# Patient Record
Sex: Female | Born: 1975
Health system: Southern US, Community
[De-identification: ages and names within clinical notes are randomized; demographics above are authoritative.]

## PROBLEM LIST (undated history)

## (undated) DIAGNOSIS — T8859XA Other complications of anesthesia, initial encounter: Secondary | ICD-10-CM

## (undated) DIAGNOSIS — Z9889 Other specified postprocedural states: Secondary | ICD-10-CM

## (undated) DIAGNOSIS — M2540 Effusion, unspecified joint: Secondary | ICD-10-CM

## (undated) DIAGNOSIS — E785 Hyperlipidemia, unspecified: Secondary | ICD-10-CM

## (undated) DIAGNOSIS — R112 Nausea with vomiting, unspecified: Secondary | ICD-10-CM

## (undated) HISTORY — DX: Effusion, unspecified joint: M25.40

## (undated) HISTORY — DX: Hyperlipidemia, unspecified: E78.5

---

## 2000-04-17 ENCOUNTER — Encounter: Admission: RE | Admit: 2000-04-17 | Discharge: 2000-04-17 | Payer: Self-pay | Admitting: Gastroenterology

## 2000-04-17 ENCOUNTER — Encounter: Payer: Self-pay | Admitting: Gastroenterology

## 2001-02-10 ENCOUNTER — Emergency Department (HOSPITAL_COMMUNITY): Admission: EM | Admit: 2001-02-10 | Discharge: 2001-02-10 | Payer: Self-pay | Admitting: Emergency Medicine

## 2001-02-10 ENCOUNTER — Encounter: Payer: Self-pay | Admitting: Emergency Medicine

## 2001-03-22 ENCOUNTER — Ambulatory Visit (HOSPITAL_COMMUNITY): Admission: RE | Admit: 2001-03-22 | Discharge: 2001-03-22 | Payer: Self-pay | Admitting: Gastroenterology

## 2001-03-26 ENCOUNTER — Ambulatory Visit (HOSPITAL_COMMUNITY): Admission: RE | Admit: 2001-03-26 | Discharge: 2001-03-26 | Payer: Self-pay | Admitting: Gastroenterology

## 2001-08-15 ENCOUNTER — Other Ambulatory Visit: Admission: RE | Admit: 2001-08-15 | Discharge: 2001-08-15 | Payer: Self-pay | Admitting: Obstetrics and Gynecology

## 2002-02-18 ENCOUNTER — Inpatient Hospital Stay (HOSPITAL_COMMUNITY): Admission: AD | Admit: 2002-02-18 | Discharge: 2002-02-18 | Payer: Self-pay | Admitting: Obstetrics and Gynecology

## 2002-03-04 ENCOUNTER — Inpatient Hospital Stay (HOSPITAL_COMMUNITY): Admission: AD | Admit: 2002-03-04 | Discharge: 2002-03-08 | Payer: Self-pay | Admitting: *Deleted

## 2002-03-09 ENCOUNTER — Encounter: Admission: RE | Admit: 2002-03-09 | Discharge: 2002-04-08 | Payer: Self-pay | Admitting: Obstetrics and Gynecology

## 2002-04-19 ENCOUNTER — Other Ambulatory Visit: Admission: RE | Admit: 2002-04-19 | Discharge: 2002-04-19 | Payer: Self-pay | Admitting: Obstetrics and Gynecology

## 2003-04-23 ENCOUNTER — Other Ambulatory Visit: Admission: RE | Admit: 2003-04-23 | Discharge: 2003-04-23 | Payer: Self-pay | Admitting: Obstetrics and Gynecology

## 2004-05-19 ENCOUNTER — Other Ambulatory Visit: Admission: RE | Admit: 2004-05-19 | Discharge: 2004-05-19 | Payer: Self-pay | Admitting: Obstetrics and Gynecology

## 2005-06-01 ENCOUNTER — Other Ambulatory Visit: Admission: RE | Admit: 2005-06-01 | Discharge: 2005-06-01 | Payer: Self-pay | Admitting: Obstetrics and Gynecology

## 2006-03-22 ENCOUNTER — Inpatient Hospital Stay (HOSPITAL_COMMUNITY): Admission: AD | Admit: 2006-03-22 | Discharge: 2006-03-22 | Payer: Self-pay | Admitting: Obstetrics and Gynecology

## 2006-08-29 HISTORY — PX: DILITATION & CURRETTAGE/HYSTROSCOPY WITH ESSURE: SHX5573

## 2006-09-12 ENCOUNTER — Inpatient Hospital Stay (HOSPITAL_COMMUNITY): Admission: AD | Admit: 2006-09-12 | Discharge: 2006-09-14 | Payer: Self-pay | Admitting: Obstetrics and Gynecology

## 2006-09-22 ENCOUNTER — Observation Stay (HOSPITAL_COMMUNITY): Admission: AD | Admit: 2006-09-22 | Discharge: 2006-09-22 | Payer: Self-pay | Admitting: Obstetrics & Gynecology

## 2011-08-26 ENCOUNTER — Ambulatory Visit (INDEPENDENT_AMBULATORY_CARE_PROVIDER_SITE_OTHER): Payer: BC Managed Care – PPO

## 2011-08-26 DIAGNOSIS — J111 Influenza due to unidentified influenza virus with other respiratory manifestations: Secondary | ICD-10-CM

## 2012-07-05 ENCOUNTER — Ambulatory Visit (INDEPENDENT_AMBULATORY_CARE_PROVIDER_SITE_OTHER): Payer: 59 | Admitting: Physician Assistant

## 2012-07-05 VITALS — BP 124/82 | HR 60 | Temp 98.4°F | Resp 16 | Ht 64.5 in | Wt 149.0 lb

## 2012-07-05 DIAGNOSIS — R197 Diarrhea, unspecified: Secondary | ICD-10-CM

## 2012-07-05 DIAGNOSIS — R110 Nausea: Secondary | ICD-10-CM

## 2012-07-05 DIAGNOSIS — A084 Viral intestinal infection, unspecified: Secondary | ICD-10-CM

## 2012-07-05 DIAGNOSIS — A088 Other specified intestinal infections: Secondary | ICD-10-CM

## 2012-07-05 MED ORDER — ONDANSETRON 4 MG PO TBDP
8.0000 mg | ORAL_TABLET | Freq: Once | ORAL | Status: AC
  Administered 2012-07-05: 8 mg via ORAL

## 2012-07-05 MED ORDER — PROMETHAZINE HCL 25 MG PO TABS: 25.0000 mg | ORAL_TABLET | Freq: Three times a day (TID) | ORAL | Status: DC | PRN

## 2012-07-05 NOTE — Progress Notes (Signed)
  Subjective:    Patient ID: Kayla Chaney, female    DOB: 1976-01-20, 37 y.o.   MRN: [id number]  HPI 36 year old female presents with acute onset of nausea, loose stools, and abdominal discomfort but states it is not pain. No fevers, chills, headache, or emesis.  Husband woke up with similar symptoms this morning. Admits that her daughter had viral gastroenteritis last week.  Has felt ok since then but today symptoms started. No emesis yet, but feels severely nauseous.     Review of Systems  Constitutional: Negative for fever and chills.  Gastrointestinal: Positive for nausea and diarrhea. Negative for vomiting and abdominal pain.  Neurological: Negative for headaches.  All other systems reviewed and are negative.       Objective:   Physical Exam  Constitutional: She is oriented to person, place, and time. She appears well-developed and well-nourished.  HENT:  Head: Normocephalic and atraumatic.  Right Ear: External ear normal.  Left Ear: External ear normal.  Eyes: Conjunctivae normal are normal.  Neck: Normal range of motion.  Cardiovascular: Normal rate, regular rhythm and normal heart sounds.   Pulmonary/Chest: Effort normal and breath sounds normal.  Abdominal: Soft. Bowel sounds are normal. There is tenderness (slight epigastric tenderness). There is no rebound and no guarding.  Neurological: She is alert and oriented to person, place, and time.  Psychiatric: She has a normal mood and affect. Her behavior is normal. Judgment and thought content normal.          Assessment & Plan:   1. Viral gastroenteritis    2. Nausea alone  ondansetron (ZOFRAN-ODT) disintegrating tablet 8 mg  3. Diarrhea    Zofran 8 mg given today Phenergan prn Push fluids OOW tomorrow if still symptomatic.

## 2012-07-06 ENCOUNTER — Ambulatory Visit (INDEPENDENT_AMBULATORY_CARE_PROVIDER_SITE_OTHER): Payer: Commercial Managed Care - PPO | Admitting: Physician Assistant

## 2012-07-06 VITALS — BP 89/57 | HR 84 | Temp 98.4°F | Resp 16 | Ht 64.5 in | Wt 148.0 lb

## 2012-07-06 DIAGNOSIS — R1110 Vomiting, unspecified: Secondary | ICD-10-CM

## 2012-07-06 DIAGNOSIS — R110 Nausea: Secondary | ICD-10-CM

## 2012-07-06 DIAGNOSIS — A084 Viral intestinal infection, unspecified: Secondary | ICD-10-CM

## 2012-07-06 DIAGNOSIS — A088 Other specified intestinal infections: Secondary | ICD-10-CM

## 2012-07-06 MED ORDER — ONDANSETRON 4 MG PO TBDP
8.0000 mg | ORAL_TABLET | Freq: Once | ORAL | Status: AC
  Administered 2012-07-06: 8 mg via ORAL

## 2012-07-06 NOTE — Progress Notes (Signed)
  Subjective:    Patient ID: Kayla Chaney, female    DOB: 09-01-1975, 36 y.o.   MRN: [id number]  HPI 36 year old female presents for follow up of nausea. Seen yesterday and had not started vomiting yet.  States that she did start having emesis when she left work which persisted through the night. Says the emesis stopped at approximately 5:00 a.m this morning, but she still feels nauseous and tired.  Has not been able to push fluids. Denies diarrhea, abdominal pain, or urinary symptoms.      Review of Systems  Gastrointestinal: Positive for nausea and vomiting. Negative for abdominal pain, diarrhea and constipation.  Neurological: Positive for dizziness and light-headedness.  All other systems reviewed and are negative.       Objective:   Physical Exam  Constitutional: She is oriented to person, place, and time. She appears well-developed and well-nourished.  HENT:  Head: Normocephalic and atraumatic.  Right Ear: External ear normal.  Left Ear: External ear normal.  Eyes: Conjunctivae normal are normal.  Cardiovascular: Normal rate.   Pulmonary/Chest: Effort normal.  Neurological: She is alert and oriented to person, place, and time.  Psychiatric: She has a normal mood and affect. Her behavior is normal. Judgment and thought content normal.          Assessment & Plan:   1. Viral gastroenteritis    2. Nausea  ondansetron (ZOFRAN-ODT) disintegrating tablet 8 mg  3. Emesis     Nausea increased while here receiving IV fluids.  Does admit that dizziness has improved with fluids.  No emesis yet today but due to increase in nausea will send home to rest.  Phenergan 25 mg tid prn Push fluids Follow up if symptoms worsen or fail to improve.

## 2013-09-10 ENCOUNTER — Ambulatory Visit (INDEPENDENT_AMBULATORY_CARE_PROVIDER_SITE_OTHER): Payer: 59 | Admitting: Physician Assistant

## 2013-09-10 VITALS — BP 126/78 | HR 83 | Temp 99.8°F | Resp 16 | Ht 64.5 in | Wt 154.6 lb

## 2013-09-10 DIAGNOSIS — R6889 Other general symptoms and signs: Secondary | ICD-10-CM

## 2013-09-10 LAB — POCT INFLUENZA A/B
Influenza A, POC: NEGATIVE
Influenza B, POC: NEGATIVE

## 2013-09-10 MED ORDER — HYDROCODONE-HOMATROPINE 5-1.5 MG/5ML PO SYRP: 5.0000 mL | ORAL_SOLUTION | Freq: Three times a day (TID) | ORAL | Status: DC | PRN

## 2013-09-10 NOTE — Progress Notes (Signed)
   Subjective:    Patient ID: Kayla Chaney, female    DOB: [birth year], 38 y.o.   MRN: [id number]  HPI Pt presents to clinic with 16hr h/o feeling poorly she woke up in the middle of the night feeling bad - throughout the day she has had increased myalgias and fever.  She has a dry cough which the mucinex seems to help.  She has some nasal congestion but not much.  OTC meds - mucinex DM and tylenol Sick contacts Flu vaccine this fall Review of Systems  Constitutional: Positive for fever (101 this afternoon) and chills.  HENT: Positive for congestion, postnasal drip, rhinorrhea (clear) and sore throat (irritated from the cough).   Respiratory: Positive for cough (dry).   Musculoskeletal: Positive for myalgias.  Psychiatric/Behavioral: Positive for sleep disturbance (2nd to cough).       Objective:   Physical Exam  Vitals reviewed. Constitutional: She is oriented to person, place, and time. She appears well-developed and well-nourished.  HENT:  Head: Normocephalic and atraumatic.  Right Ear: External ear normal.  Left Ear: External ear normal.  Eyes: Conjunctivae are normal.  Neck: Normal range of motion.  Cardiovascular: Normal rate, regular rhythm and normal heart sounds.   No murmur heard. Pulmonary/Chest: Effort normal and breath sounds normal.  Lymphadenopathy:       Head (right side): Tonsillar adenopathy present.       Head (left side): Tonsillar adenopathy present.    She has cervical adenopathy (Ac enlarged).       Right cervical: Superficial cervical adenopathy present.       Left cervical: Superficial cervical adenopathy present.       Right: No supraclavicular adenopathy present.       Left: No supraclavicular adenopathy present.  Neurological: She is alert and oriented to person, place, and time.  Skin: Skin is warm and dry.  Psychiatric: She has a normal mood and affect. Her behavior is normal. Judgment and thought content normal.   Results for orders placed in  visit on 09/10/13  POCT INFLUENZA A/B      Result Value Range   Influenza A, POC Negative     Influenza B, POC Negative         Assessment & Plan:  Flu-like symptoms - Plan: POCT Influenza A/B, HYDROcodone-homatropine (HYCODAN) 5-1.5 MG/5ML syrup  Pt had neg Flu testing - she declined the Tamiflu and she will continue her suportive care and we will add cough medication for nighttime.  Windell Hummingbird PA-C 09/10/2013 7:01 PM

## 2013-11-13 ENCOUNTER — Ambulatory Visit (INDEPENDENT_AMBULATORY_CARE_PROVIDER_SITE_OTHER): Payer: 59 | Admitting: Physician Assistant

## 2013-11-13 ENCOUNTER — Encounter: Payer: Self-pay | Admitting: Physician Assistant

## 2013-11-13 ENCOUNTER — Other Ambulatory Visit: Payer: Self-pay | Admitting: Physician Assistant

## 2013-11-13 VITALS — BP 110/61 | HR 59 | Temp 99.1°F | Resp 16 | Ht 64.5 in | Wt 153.9 lb

## 2013-11-13 DIAGNOSIS — M47812 Spondylosis without myelopathy or radiculopathy, cervical region: Secondary | ICD-10-CM | POA: Insufficient documentation

## 2013-11-13 DIAGNOSIS — G4700 Insomnia, unspecified: Secondary | ICD-10-CM

## 2013-11-13 MED ORDER — TRAZODONE HCL 50 MG PO TABS: 50.0000 mg | ORAL_TABLET | ORAL | Status: DC | PRN

## 2013-11-13 NOTE — Progress Notes (Signed)
   Subjective:    Patient ID: Kayla Chaney, female    DOB: 1975-11-28, 38 y.o.   MRN: [id number]  HPI Pt presents to clinic for an evaluated of continued insomnia.  She has a h/o of Ambien use but started to like her reaction to the medication.  She started on the trazodone and it really helps her sleep.  2-3 nights out of a month she finds that Trazodone does not allow her sleep restfully and she has tried 75mg  but that makes her groggy the next day.  She finds the nights she has trouble sleeping she is really stressed out.  She is otherwise doing well.  She likes her new position at work.  Review of Systems     Objective:   Physical Exam  Vitals reviewed. Constitutional: She is oriented to person, place, and time. She appears well-developed and well-nourished.  HENT:  Head: Normocephalic and atraumatic.  Right Ear: External ear normal.  Left Ear: External ear normal.  Eyes: Conjunctivae are normal.  Neck: Normal range of motion.  Pulmonary/Chest: Effort normal.  Neurological: She is alert and oriented to person, place, and time.  Skin: Skin is warm and dry.  Psychiatric: She has a normal mood and affect. Her behavior is normal. Judgment and thought content normal.       Assessment & Plan:  Insomnia - Plan: traZODone (DESYREL) 50 MG tablet - continue current regimen and let me know if she has worsening problems in the future.  Windell Hummingbird PA-C  [facility] 11/13/2013 6:22 PM

## 2013-12-24 ENCOUNTER — Ambulatory Visit: Payer: 59 | Admitting: Emergency Medicine

## 2013-12-25 ENCOUNTER — Ambulatory Visit: Payer: 59 | Admitting: Physician Assistant

## 2014-02-19 ENCOUNTER — Other Ambulatory Visit: Payer: Self-pay | Admitting: Physician Assistant

## 2014-02-19 DIAGNOSIS — G4700 Insomnia, unspecified: Secondary | ICD-10-CM

## 2014-02-19 MED ORDER — TRAZODONE HCL 50 MG PO TABS
50.0000 mg | ORAL_TABLET | Freq: Every evening | ORAL | Status: DC | PRN
Start: 2014-02-19 — End: 2014-02-19

## 2014-02-19 MED ORDER — TRAZODONE HCL 50 MG PO TABS: 50.0000 mg | ORAL_TABLET | Freq: Every evening | ORAL | Status: DC | PRN

## 2014-03-31 ENCOUNTER — Ambulatory Visit (INDEPENDENT_AMBULATORY_CARE_PROVIDER_SITE_OTHER): Payer: 59 | Admitting: Physician Assistant

## 2014-03-31 VITALS — BP 134/80 | HR 76 | Temp 99.6°F | Resp 16 | Ht 64.75 in | Wt 154.2 lb

## 2014-03-31 DIAGNOSIS — Z733 Stress, not elsewhere classified: Secondary | ICD-10-CM

## 2014-03-31 DIAGNOSIS — F439 Reaction to severe stress, unspecified: Secondary | ICD-10-CM

## 2014-03-31 DIAGNOSIS — G4700 Insomnia, unspecified: Secondary | ICD-10-CM

## 2014-03-31 MED ORDER — CLONAZEPAM 0.5 MG PO TABS: 0.2500 mg | ORAL_TABLET | Freq: Three times a day (TID) | ORAL | Status: DC | PRN
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
## 2014-04-14 ENCOUNTER — Other Ambulatory Visit: Payer: Self-pay | Admitting: Physician Assistant

## 2014-04-14 DIAGNOSIS — F439 Reaction to severe stress, unspecified: Secondary | ICD-10-CM

## 2014-04-14 MED ORDER — CLONAZEPAM 0.5 MG PO TABS
0.2500 mg | ORAL_TABLET | Freq: Three times a day (TID) | ORAL | Status: DC | PRN
Start: 2014-04-14 — End: 2014-06-01

## 2014-04-29 ENCOUNTER — Other Ambulatory Visit: Payer: Self-pay | Admitting: Physician Assistant

## 2014-05-06 ENCOUNTER — Other Ambulatory Visit: Payer: Self-pay | Admitting: Physician Assistant

## 2014-05-06 MED ORDER — HYDROXYZINE HCL 10 MG PO TABS: 10.0000 mg | ORAL_TABLET | Freq: Three times a day (TID) | ORAL | Status: DC | PRN

## 2014-05-08 ENCOUNTER — Other Ambulatory Visit: Payer: Self-pay | Admitting: Physician Assistant

## 2014-05-08 DIAGNOSIS — G4700 Insomnia, unspecified: Secondary | ICD-10-CM

## 2014-05-08 MED ORDER — TRAZODONE HCL 50 MG PO TABS: 50.0000 mg | ORAL_TABLET | Freq: Every evening | ORAL | Status: DC | PRN

## 2014-06-01 ENCOUNTER — Ambulatory Visit (INDEPENDENT_AMBULATORY_CARE_PROVIDER_SITE_OTHER): Payer: 59 | Admitting: Physician Assistant

## 2014-06-01 VITALS — BP 128/72 | HR 95 | Temp 98.8°F | Resp 18 | Ht 65.25 in | Wt 155.0 lb

## 2014-06-01 DIAGNOSIS — R059 Cough, unspecified: Secondary | ICD-10-CM

## 2014-06-01 DIAGNOSIS — R05 Cough: Secondary | ICD-10-CM

## 2014-06-01 MED ORDER — IPRATROPIUM BROMIDE 0.02 % IN SOLN
0.5000 mg | Freq: Once | RESPIRATORY_TRACT | Status: AC
  Administered 2014-06-01: 0.5 mg via RESPIRATORY_TRACT

## 2014-06-01 MED ORDER — ALBUTEROL SULFATE HFA 108 (90 BASE) MCG/ACT IN AERS: 2.0000 | INHALATION_SPRAY | RESPIRATORY_TRACT | Status: DC | PRN

## 2014-06-01 MED ORDER — ALBUTEROL SULFATE (2.5 MG/3ML) 0.083% IN NEBU
2.5000 mg | INHALATION_SOLUTION | Freq: Once | RESPIRATORY_TRACT | Status: AC
  Administered 2014-06-01: 2.5 mg via RESPIRATORY_TRACT

## 2014-06-01 MED ORDER — HYDROCODONE-HOMATROPINE 5-1.5 MG/5ML PO SYRP: 5.0000 mL | ORAL_SOLUTION | Freq: Three times a day (TID) | ORAL | Status: DC | PRN

## 2014-06-01 NOTE — Patient Instructions (Signed)
Get plenty of rest.  Be sure to stay hydrated and drink plenty of liquids.  Continue taking your mucinex and tylenol at home as needed.  Take hycodan as prescribed for cough.  Use albuterol as needed for wheezing.  If you're still not feeling well by Day 10 of your illness please return for evaluation.

## 2014-06-01 NOTE — Progress Notes (Signed)
   Subjective:    Patient ID: Kayla Chaney, female    DOB: 1976/02/01, 38 y.o.   MRN: [id number]  Cough Associated symptoms include a fever, rhinorrhea and a sore throat. Pertinent negatives include no chest pain, chills or ear pain.    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 About four days ago she originally noticed a sore throat. It was not severe and tylenol has managed to help with the pain. Shortly after the sore throat she began having a cough which has been productive with clear sputum. The cough has been worsening for the past few days. She is coughing throughout the day but feels it is worst at night. She has been taking mucinex which has helped a little. She has noticed some fever and chills at home throughout this time, took temp at work 2 days ago and was 101. She first noticed wheezing last night. She went to locate her inhaler but noticed it was 39 years old therefore did not use it. She thinks she had asthma as a child but has only occasionally needed inhalers with URIs in the past. Denies HA, ear pain, stomach pain, N/V, diarrhea.     Review of Systems  Constitutional: Positive for fever. Negative for chills.  HENT: Positive for congestion, rhinorrhea, sinus pressure and sore throat. Negative for ear pain and sneezing.   Respiratory: Positive for cough.   Cardiovascular: Negative for chest pain.  Gastrointestinal: Negative for nausea, vomiting and diarrhea.       Objective:   Physical Exam  BP 128/72  Pulse 95  Temp(Src) 98.8 F (37.1 C) (Oral)  Resp 18  Ht 5' 5.25" (1.657 m)  Wt 155 lb (70.308 kg)  BMI 25.61 kg/m2  SpO2 98% General: Patient lying in bed with lights turned down. Appears to feel unwell.  HEENT: Ears. TM pearly grey bilaterally. No erythema, normal cone of light. Nasal: Turbinates pink. Lymph nodes: No cervical or submandibular LAN. Sinuses: No pain with sinus palpation. Pharynx: No erythema or exudates.  Heart: RRR. No murmurs, rubs,  gallops.  Lungs: Rhonchi RUL resolved with cough. No rales or wheezes.  Psych: Alert and oriented. Speech and behavior normal.  Skin: Warm and dry.       Assessment & Plan:   38 yof who who presents with 4 day history of sore throat, cough, fever, and wheezing.  Most likely viral rhinitis/bronchitis. Pneumonia unlikely without focal findings on PE.  No wheezing appreciated today but albuterol/ipratroprium nebulizer given today per patient request as she will not be able to obtain her albuterol inhaler until tomorrow. Patient left without wheezing and lungs clear.  Hycodan prescription given for cough. Prescription given for albuterol inhaler at home if wheezing recurs.  Patient instructions given.

## 2014-06-01 NOTE — Progress Notes (Signed)
I have examined this patient along with Mr. Rosanne Sack, [place] and agree.

## 2014-06-06 ENCOUNTER — Other Ambulatory Visit: Payer: Self-pay | Admitting: *Deleted

## 2014-06-06 DIAGNOSIS — R05 Cough: Secondary | ICD-10-CM

## 2014-06-06 DIAGNOSIS — R059 Cough, unspecified: Secondary | ICD-10-CM

## 2014-06-06 MED ORDER — HYDROCODONE-HOMATROPINE 5-1.5 MG/5ML PO SYRP: 5.0000 mL | ORAL_SOLUTION | Freq: Three times a day (TID) | ORAL | Status: DC | PRN

## 2014-06-06 NOTE — Telephone Encounter (Signed)
Pt needs a refill on hycodan. She would like this filled before lunch so she can get this filled before going home tonight.

## 2014-07-21 ENCOUNTER — Other Ambulatory Visit: Payer: Self-pay | Admitting: *Deleted

## 2014-07-21 MED ORDER — HYDROXYZINE HCL 10 MG PO TABS: 10.0000 mg | ORAL_TABLET | Freq: Three times a day (TID) | ORAL | Status: DC | PRN

## 2014-08-11 ENCOUNTER — Ambulatory Visit (INDEPENDENT_AMBULATORY_CARE_PROVIDER_SITE_OTHER): Payer: 59 | Admitting: Family Medicine

## 2014-08-11 ENCOUNTER — Ambulatory Visit (INDEPENDENT_AMBULATORY_CARE_PROVIDER_SITE_OTHER): Payer: 59

## 2014-08-11 VITALS — BP 120/74 | HR 76 | Temp 98.6°F | Resp 16 | Ht 64.5 in | Wt 156.0 lb

## 2014-08-11 DIAGNOSIS — M25512 Pain in left shoulder: Secondary | ICD-10-CM

## 2014-08-11 MED ORDER — MELOXICAM 15 MG PO TABS: 15.0000 mg | ORAL_TABLET | Freq: Every day | ORAL | Status: DC

## 2014-08-11 MED ORDER — HYDROCODONE-ACETAMINOPHEN 5-325 MG PO TABS: ORAL_TABLET | ORAL | Status: DC

## 2014-08-11 NOTE — Patient Instructions (Addendum)
Rotator Cuff Tendinitis  Rotator cuff tendinitis is inflammation of the tough, cord-like bands that connect muscle to bone (tendons) in your rotator cuff. Your rotator cuff is the collection of all the muscles and tendons that connect your arm to your shoulder. Your rotator cuff holds the head of your upper arm bone (humerus) in the cup (fossa) of your shoulder blade (scapula). CAUSES Rotator cuff tendinitis is usually caused by overusing the joint involved.  SIGNS AND SYMPTOMS  Deep ache in the shoulder also felt on the outside upper arm over the shoulder muscle.  Point tenderness over the area that is injured.  Pain comes on gradually and becomes worse with lifting the arm to the side (abduction) or turning it inward (internal rotation).  May lead to a chronic tear: When a rotator cuff tendon becomes inflamed, it runs the risk of losing its blood supply, causing some tendon fibers to die. This increases the risk that the tendon can fray and partially or completely tear. DIAGNOSIS Rotator cuff tendinitis is diagnosed by taking a medical history, performing a physical exam, and reviewing results of imaging exams. The medical history is useful to help determine the type of rotator cuff injury. The physical exam will include looking at the injured shoulder, feeling the injured area, and watching you do range-of-motion exercises. X-ray exams are typically done to rule out other causes of shoulder pain, such as fractures. MRI is the imaging exam usually used for significant shoulder injuries. Sometimes a dye study called CT arthrogram is done, but it is not as widely used as MRI. In some institutions, special ultrasound tests may also be used to aid in the diagnosis. TREATMENT  Less Severe Cases  Use of a sling to rest the shoulder for a short period of time. Prolonged use of the sling can cause stiffness, weakness, and loss of motion of the shoulder joint.  Anti-inflammatory medicines, such as  ibuprofen or naproxen sodium, may be prescribed. More Severe Cases  Physical therapy.  Use of steroid injections into the shoulder joint.  Surgery. HOME CARE INSTRUCTIONS   Use a sling or splint until the pain decreases. Prolonged use of the sling can cause stiffness, weakness, and loss of motion of the shoulder joint.  Apply ice to the injured area:  Put ice in a plastic bag.  Place a towel between your skin and the bag.  Leave the ice on for 20 minutes, 2-3 times a day.  Try to avoid use other than gentle range of motion while your shoulder is painful. Use the shoulder and exercise only as directed by your health care provider. Stop exercises or range of motion if pain or discomfort increases, unless directed otherwise by your health care provider.  Only take over-the-counter or prescription medicines for pain, discomfort, or fever as directed by your health care provider.  If you were given a shoulder sling and straps (immobilizer), do not remove it except as directed, or until you see a health care provider for a follow-up exam. If you need to remove it, move your arm as little as possible or as directed.  You may want to sleep on several pillows at night to lessen swelling and pain. SEEK IMMEDIATE MEDICAL CARE IF:   Your shoulder pain increases or new pain develops in your arm, hand, or fingers and is not relieved with medicines.  You have new, unexplained symptoms, especially increased numbness in the hands or loss of strength.  You develop any worsening of the problems  that brought you in for care.  Your arm, hand, or fingers are numb or tingling.  Your arm, hand, or fingers are swollen, painful, or turn white or blue. MAKE SURE YOU:  Understand these instructions.  Will watch your condition.  Will get help right away if you are not doing well or get worse. Document Released: 11/05/2003 Document Revised: 06/05/2013 Document Reviewed: 03/27/2013 ExitCare Patient  Information 2015 [employer]. This information is not intended to replace advice given to you by your health care provider. Make sure you discuss any questions you have with your health care provider.  

## 2014-08-11 NOTE — Progress Notes (Signed)
   Subjective:    Patient ID: Kayla Chaney, female    DOB: 09-11-1975, 38 y.o.   MRN: [id number]  HPI This is a pleasant 38 yo female who presents today with 2-3 week history of left shoulder pain. She first noticed pain while shopping. She changed her handbag to her other arm and later decreased its weight. She does not recall any injury to her shoulder. Exercise includes Zumba. She does not lift weights nor has she had any particular repetitive motion. She notices the pain with pushing open the lobby door at work and when she rotates her arm inward. It is increasingly painful at night, even when she is not sleeping on her shoulder. She has been taking Alleve 2 tablets 1-2 times a day. The evening dose tends to upset her stomach. She tried heat which did not give her relief and ice, which gave her temporary improvement of her pain. Although she gets some relief with Alleve, the pain seems to getting worse at night.  Review of Systems No fever, no chills, no neck pain, no neck decreased ROM. No weakness, no radiation, no numbness/tingling.    Objective:   Physical Exam  Constitutional: She is oriented to person, place, and time. She appears well-developed and well-nourished.  HENT:  Head: Normocephalic and atraumatic.  Eyes: Conjunctivae are normal.  Neck: Normal range of motion. Neck supple.  Cardiovascular: Normal rate.   Pulmonary/Chest: Effort normal.  Musculoskeletal: Normal range of motion. She exhibits no edema or tenderness.       Left shoulder: She exhibits pain. She exhibits normal range of motion, no tenderness, no bony tenderness, no swelling, no effusion, no crepitus, no deformity, no laceration, no spasm and normal strength.  + Active painful arc + Neers +Hawkins-Kennedy Empty can= pain, no weakness.  Neurological: She is alert and oriented to person, place, and time. She has normal reflexes.  Skin: Skin is warm and dry.  Psychiatric: She has a normal mood and affect. Her  behavior is normal. Judgment and thought content normal.  Vitals reviewed. BP 120/74 mmHg  Pulse 76  Temp(Src) 98.6 F (37 C) (Oral)  Resp 16  Ht 5' 4.5" (1.638 m)  Wt 156 lb (70.761 kg)  BMI 26.37 kg/m2  SpO2 97% Left shoulder complete XRAY. [facility] reading (PRIMARY) by  Dr. Carlota Raspberry- normal Xray.    Assessment & Plan:  Discussed with Dr. Carlota Raspberry who examined the patient. 1. Left shoulder pain - DG Shoulder Left; Future - meloxicam (MOBIC) 15 MG tablet; Take 1 tablet (15 mg total) by mouth daily.  Dispense: 30 tablet; Refill: 0 - HYDROcodone-acetaminophen (NORCO) 5-325 MG per tablet; Take 1 tablet at bedtime as needed for pain  Dispense: 20 tablet; Refill: 0 - Ice, rest, gentle ROM.  Elby Beck, FNP-BC  [facility]  08/11/2014 10:03 PM

## 2014-08-13 NOTE — Progress Notes (Signed)
Xray read and patient discussed with Ms. Carlean Purl. Agree with assessment and plan of care per her note. On my exam, she did have a positive Neer and Hawkins indicating likely impingement, but some pain with RTC testing with external rotation only. No significant relief with OTC nsaid. Options discussed with trial of meloxicam and relative rest, but d/t pain and lack of relief at this point with NSAIDS, chose trial of subacromial injection.   Risks (including but not limited to bleeding and infection, or injury to underlying tissues), benefits, and alternatives discussed for L subacromial injection .  Verbal consent obtained after any questions were answered. Landmarks noted, and marked as needed. Area cleansed with Betadine x2, ethyl chloride spray for topical anesthesia, followed by alcohol swab.  Injected with 1cc kenalog 40mg /ml and 2 cc lidocaine 2% plain.  No complications. Bandage applied.  Noted some initial improvement in pain of affected area. RTC precautions discussed in regards to injection.

## 2014-09-12 ENCOUNTER — Other Ambulatory Visit: Payer: Self-pay | Admitting: Family Medicine

## 2014-10-10 ENCOUNTER — Other Ambulatory Visit: Payer: Self-pay | Admitting: Physician Assistant

## 2014-10-28 ENCOUNTER — Ambulatory Visit (INDEPENDENT_AMBULATORY_CARE_PROVIDER_SITE_OTHER): Payer: 59 | Admitting: Physician Assistant

## 2014-10-28 VITALS — BP 106/56 | HR 68 | Temp 97.8°F | Resp 16 | Ht 65.0 in | Wt 158.0 lb

## 2014-10-28 DIAGNOSIS — R8290 Unspecified abnormal findings in urine: Secondary | ICD-10-CM

## 2014-10-28 DIAGNOSIS — G4700 Insomnia, unspecified: Secondary | ICD-10-CM

## 2014-10-28 DIAGNOSIS — N3000 Acute cystitis without hematuria: Secondary | ICD-10-CM | POA: Diagnosis not present

## 2014-10-28 LAB — POCT UA - MICROSCOPIC ONLY
CASTS, UR, LPF, POC: NEGATIVE
Crystals, Ur, HPF, POC: NEGATIVE
MUCUS UA: NEGATIVE
Yeast, UA: NEGATIVE

## 2014-10-28 LAB — POCT URINALYSIS DIPSTICK
BILIRUBIN UA: NEGATIVE
Glucose, UA: NEGATIVE
KETONES UA: NEGATIVE
Nitrite, UA: POSITIVE
Spec Grav, UA: 1.015
Urobilinogen, UA: 0.2
pH, UA: 5

## 2014-10-28 MED ORDER — NITROFURANTOIN MONOHYD MACRO 100 MG PO CAPS: 100.0000 mg | ORAL_CAPSULE | Freq: Two times a day (BID) | ORAL | Status: DC

## 2014-10-28 MED ORDER — FLUCONAZOLE 150 MG PO TABS: 150.0000 mg | ORAL_TABLET | Freq: Once | ORAL | Status: DC

## 2014-10-28 MED ORDER — TRAZODONE HCL 50 MG PO TABS: 50.0000 mg | ORAL_TABLET | Freq: Every evening | ORAL | Status: DC | PRN

## 2014-10-28 NOTE — Progress Notes (Signed)
Subjective:    Patient ID: Kayla Chaney, female    DOB: 04/19/1976, 39 y.o.   MRN: [id number]  HPI  Pt presents to clinic with foul smelling urine for the last week or so.  She thought it was because she was exercising and maybe a little dehydrated so she increased her fluid intake but the odor has only gotten worse.  She is having no other urinary symptoms - no dysuria, frequency or urgency.  She is having no vaginal complants.  She overall does not feel well but does not have cold or flu symptoms.    The trazodone is helping with her sleep.  Most nights she uses 50mg  and then once every 2 weeks or so she uses 100mg  when she has had a really stressful day.  IT is still working well for her.  Review of Systems  Constitutional: Negative for fever and chills.  Gastrointestinal: Negative for abdominal pain.  Genitourinary: Negative for dysuria, hematuria, flank pain, vaginal discharge, vaginal pain and menstrual problem (IUD - no menses).  Musculoskeletal: Negative for myalgias and back pain.  Neurological: Negative for headaches.   Patient Active Problem List   Diagnosis Date Noted  . Insomnia 11/13/2013  . Degenerative joint disease of cervical spine 11/13/2013   Prior to Admission medications   Medication Sig Start Date End Date Taking? Authorizing Provider  meloxicam (MOBIC) 15 MG tablet TAKE 1 TABLET (15 MG TOTAL) BY MOUTH DAILY. 09/12/14  Yes Elby Beck, FNP  traZODone (DESYREL) 50 MG tablet Take 1-2 tablets (50-100 mg total) by mouth at bedtime as needed. 05/08/14  Yes Mancel Bale, PA-C   No Known Allergies  Medications, allergies, past medical history, surgical history, family history, social history and problem list reviewed and updated.      Objective:   Physical Exam  Constitutional: She is oriented to person, place, and time. She appears well-developed and well-nourished.  BP 106/56 mmHg  Pulse 68  Temp(Src) 97.8 F (36.6 C)  Resp 16  Ht 5\' 5"  (1.651 m)   Wt 158 lb (71.668 kg)  BMI 26.29 kg/m2  SpO2 98%   HENT:  Head: Normocephalic and atraumatic.  Right Ear: External ear normal.  Left Ear: External ear normal.  Eyes: Conjunctivae are normal.  Cardiovascular: Normal rate, regular rhythm and normal heart sounds.   No murmur heard. Pulmonary/Chest: Effort normal and breath sounds normal. She has no wheezes.  Abdominal: Soft. There is tenderness (slight TTP suprapubic TTP) in the suprapubic area. There is no CVA tenderness.  Neurological: She is alert and oriented to person, place, and time.  Skin: Skin is warm and dry.  Psychiatric: She has a normal mood and affect. Her behavior is normal. Judgment and thought content normal.   Results for orders placed or performed in visit on 10/28/14  POCT urinalysis dipstick  Result Value Ref Range   Color, UA yellow    Clarity, UA jazy    Glucose, UA neg    Bilirubin, UA neg    Ketones, UA neg    Spec Grav, UA 1.015    Blood, UA large    pH, UA 5.0    Protein, UA trace    Urobilinogen, UA 0.2    Nitrite, UA positive    Leukocytes, UA moderate (2+)   POCT UA - Microscopic Only  Result Value Ref Range   WBC, Ur, HPF, POC 20-25    RBC, urine, microscopic 10-12    Bacteria, U Microscopic 3+  Mucus, UA neg    Epithelial cells, urine per micros 0-2    Crystals, Ur, HPF, POC neg    Casts, Ur, LPF, POC neg    Yeast, UA neg        Assessment & Plan:  Abnormal urine odor - Plan: POCT urinalysis dipstick, POCT UA - Microscopic Only, Urine culture  Insomnia - Plan: traZODone (DESYREL) 50 MG tablet  Acute cystitis without hematuria - Plan: nitrofurantoin, macrocrystal-monohydrate, (MACROBID) 100 MG capsule, fluconazole (DIFLUCAN) 150 MG tablet   Push fluids  Windell Hummingbird PA-C  [facility] 10/28/2014 1:42 PM

## 2014-10-30 LAB — URINE CULTURE

## 2014-11-04 ENCOUNTER — Telehealth: Payer: Self-pay | Admitting: Physician Assistant

## 2014-11-04 NOTE — Telephone Encounter (Signed)
Called patient.  On the correct abx.  Left this info on the VM

## 2014-11-19 ENCOUNTER — Other Ambulatory Visit: Payer: Self-pay | Admitting: Obstetrics and Gynecology

## 2014-11-20 LAB — CYTOLOGY - PAP

## 2014-12-12 ENCOUNTER — Encounter: Payer: Self-pay | Admitting: Physician Assistant

## 2015-03-11 ENCOUNTER — Other Ambulatory Visit: Payer: Self-pay | Admitting: Physician Assistant

## 2015-03-11 DIAGNOSIS — T753XXA Motion sickness, initial encounter: Secondary | ICD-10-CM

## 2015-03-11 MED ORDER — MODAFINIL 100 MG PO TABS: 100.0000 mg | ORAL_TABLET | Freq: Every day | ORAL | Status: DC

## 2015-03-11 NOTE — Progress Notes (Signed)
Pt is having to go on a boat - she gets very sea sick and has tried transderm scops in the past but they do not help.  She will apply a patch 2 days prior to her trip and the day of her trip she will apply another patch and take a provigil to stop the side affect of dropwiness - this is to make a Scopace type medication combination.

## 2015-08-25 ENCOUNTER — Other Ambulatory Visit: Payer: Self-pay | Admitting: Physician Assistant

## 2015-08-30 ENCOUNTER — Other Ambulatory Visit: Payer: Self-pay | Admitting: *Deleted

## 2015-08-30 DIAGNOSIS — G4700 Insomnia, unspecified: Secondary | ICD-10-CM

## 2015-08-30 MED ORDER — TRAZODONE HCL 50 MG PO TABS: ORAL_TABLET | ORAL | Status: DC

## 2015-09-03 MED FILL — traZODone HCL 50 MG TABS: 50 | 90 days supply | Qty: 180 | Fill #0

## 2015-09-03 MED FILL — METHOCARBAMOL 500 MG TABLET: 500 | 30 days supply | Qty: 90 | Fill #2

## 2015-09-03 MED FILL — FLUCONAZOLE 150 MG TABLET: 150 | 2 days supply | Qty: 2 | Fill #0

## 2015-10-06 MED FILL — METHOCARBAMOL 500 MG TABLET: 500 | 30 days supply | Qty: 90 | Fill #3

## 2015-11-03 MED FILL — VENTOLIN HFA 90 MCG INHALER: 108 (90 BAS | 25 days supply | Qty: 18 | Fill #0

## 2015-11-03 MED FILL — HYDROCODONE-HOMATROPINE SYR: 5-1.5 | 5 days supply | Qty: 100 | Fill #0

## 2015-11-12 ENCOUNTER — Ambulatory Visit: Payer: 59 | Admitting: Physician Assistant

## 2015-11-23 MED FILL — METHOCARBAMOL 500 MG TABLET: 500 | 30 days supply | Qty: 90 | Fill #4

## 2015-12-10 ENCOUNTER — Other Ambulatory Visit: Payer: Self-pay | Admitting: Physician Assistant

## 2015-12-10 MED ORDER — TRAZODONE HCL 50 MG PO TABS: ORAL_TABLET | ORAL | Status: DC

## 2015-12-10 MED FILL — traZODone HCL 50 MG TABS: 50 | 90 days supply | Qty: 180 | Fill #0

## 2015-12-22 MED FILL — METHOCARBAMOL 500 MG TABLET: 500 | 30 days supply | Qty: 90 | Fill #5

## 2016-01-26 MED FILL — METHOCARBAMOL 500 MG TABLET: 500 | 30 days supply | Qty: 90 | Fill #6

## 2016-02-02 DIAGNOSIS — Z6825 Body mass index (BMI) 25.0-25.9, adult: Secondary | ICD-10-CM | POA: Diagnosis not present

## 2016-02-02 DIAGNOSIS — Z01419 Encounter for gynecological examination (general) (routine) without abnormal findings: Secondary | ICD-10-CM | POA: Diagnosis not present

## 2016-03-02 MED FILL — METHOCARBAMOL 500 MG TABLET: 500 | 30 days supply | Qty: 90 | Fill #7

## 2016-04-04 MED FILL — traZODone HCL 50 MG TABS: 50 | 90 days supply | Qty: 180 | Fill #1

## 2016-04-04 MED FILL — METHOCARBAMOL 500 MG TABLET: 500 | 30 days supply | Qty: 90 | Fill #8

## 2016-05-04 MED FILL — METHOCARBAMOL 500 MG TABLET: 500 | 30 days supply | Qty: 90 | Fill #9

## 2016-06-13 MED FILL — METHOCARBAMOL 500 MG TABLET: 500 | 30 days supply | Qty: 90 | Fill #10

## 2016-08-30 ENCOUNTER — Other Ambulatory Visit (INDEPENDENT_AMBULATORY_CARE_PROVIDER_SITE_OTHER): Payer: Self-pay | Admitting: Specialist

## 2016-09-02 MED FILL — METHOCARBAMOL 500 MG TABLET: 500 | 30 days supply | Qty: 90 | Fill #0

## 2016-09-27 ENCOUNTER — Other Ambulatory Visit: Payer: Self-pay | Admitting: Physician Assistant

## 2016-09-27 MED ORDER — TRAZODONE HCL 50 MG PO TABS: ORAL_TABLET | ORAL | 1 refills | Status: DC

## 2016-10-25 MED FILL — traZODone HCL 50 MG TABS: 50 | 90 days supply | Qty: 180 | Fill #0

## 2016-11-10 ENCOUNTER — Encounter: Payer: Self-pay | Admitting: Family Medicine

## 2016-11-10 ENCOUNTER — Ambulatory Visit (INDEPENDENT_AMBULATORY_CARE_PROVIDER_SITE_OTHER): Payer: 59 | Admitting: Family Medicine

## 2016-11-10 VITALS — BP 108/64 | HR 65 | Temp 98.8°F | Resp 18 | Ht 65.0 in | Wt 155.0 lb

## 2016-11-10 DIAGNOSIS — Z114 Encounter for screening for human immunodeficiency virus [HIV]: Secondary | ICD-10-CM

## 2016-11-10 DIAGNOSIS — G4700 Insomnia, unspecified: Secondary | ICD-10-CM | POA: Diagnosis not present

## 2016-11-10 DIAGNOSIS — M5030 Other cervical disc degeneration, unspecified cervical region: Secondary | ICD-10-CM | POA: Diagnosis not present

## 2016-11-10 DIAGNOSIS — B001 Herpesviral vesicular dermatitis: Secondary | ICD-10-CM | POA: Diagnosis not present

## 2016-11-10 DIAGNOSIS — Z131 Encounter for screening for diabetes mellitus: Secondary | ICD-10-CM | POA: Diagnosis not present

## 2016-11-10 DIAGNOSIS — Z0000 Encounter for general adult medical examination without abnormal findings: Secondary | ICD-10-CM

## 2016-11-10 DIAGNOSIS — R6889 Other general symptoms and signs: Secondary | ICD-10-CM

## 2016-11-10 DIAGNOSIS — Z23 Encounter for immunization: Secondary | ICD-10-CM | POA: Diagnosis not present

## 2016-11-10 DIAGNOSIS — Z13220 Encounter for screening for lipoid disorders: Secondary | ICD-10-CM

## 2016-11-10 MED ORDER — VALACYCLOVIR HCL 1 G PO TABS: ORAL_TABLET | ORAL | 0 refills | Status: DC

## 2016-11-10 MED FILL — valACYclovir HCL 1 GM TABS: 1 | 30 days supply | Qty: 30 | Fill #0

## 2016-11-10 NOTE — Progress Notes (Signed)
By signing my name below, I, Mesha Guinyard, attest that this documentation has been prepared under the direction and in the presence of [name], MD.  Electronically Signed: Verlee Monte, Medical Scribe. 11/10/16. 8:39 AM.  Subjective:    Patient ID: Kayla Chaney, female    DOB: 05-Feb-1976, 41 y.o.   MRN: [id number]  HPI Chief Complaint  Patient presents with  . Annual Exam    HPI Comments: Kayla Chaney is a 41 y.o. female who presents to the [facility] for her complete physical.   HSV: She has cold sore flares in the summer at times when exposed to the sun. Pt takes 1 valtrex tab PRN for her flares. She still has a rx from 2016 with 10 tabs left.   Insomnia: She has taken trazodone; QHS tablet PRN. Pt is compliant with trazodone 1 QHS nightly with relief of her sxs. Reports exercising helps her sleep. She suspects trazodone also helps with her seasonal depression and reports she didn't have a "blue time" this winter. Pt discontinued 2 tablets since it caused a lower libido.  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 Pt takes robaxin QD in the morning for "morning stiffness" in her neck and takes tylenol 2-3x a day for relief of her sxs. Denies weakness or tingling down her arm.  Bone Density Screening: Pt takes Vit. D intermittently in the winter since she's not out in the sun, but in the warmer months when the sun is out, she discontinues that regimen. Pt is not a smoker or drinker. FHX: Mom had osteoporosis and was a 3 ppd smoker and drinker.  Cold Intolerance: Reports being "cold natured" and wears 3 layers of clothing without relief. Denies unexpected weight gain, fatigue when working out, and skin/hair changes.  Cancer Screening: Breast CA: Last mammogram in 2016. Cervical CA: Last pap smear in November 19, 2015. Pt's gynecologist is Dr. Corinna Capra Skin CA: FHx: Grandmother had melanoma on her head and she was a tobacco  farmer. Denies having moles removed or finding new moles.  FHx: DM runs in her family. Sister had a brain tumor removed when she was 42 y/o and she had a lot of scar tissue left over. Sister recently had a seizure and is getting a MRI with contrast this week.  Immunizations: Pt is due for her tetanus and would like to get her vaccine today. Immunization History  Administered Date(s) Administered  . Influenza-Unspecified 05/23/2014, 05/29/2016   Vision: Pt is followed by an ophthalmologist and has an appt April 2018.  Visual Acuity Screening   Right eye Left eye Both eyes  Without correction:     With correction: 20/20 20/20 20/20    Dentist: Pt is followed by a dentist with biannual appt.  Exercise: Zumba 2x a week and walks 30 mins during her lunch break when it's above 50 degrees outside. Denies chest pain, chest tightness, SOB, and difficulty breathing while exercising.  HIV Screening: Agrees to HIV screening and declines any other risk factors for STI testing.  Depression Screening: Reports she's feeling great. Denies feeling derpression at this time.  Depression screen [facility] 2/9 11/10/2016 08/11/2014 11/13/2013  Decreased Interest 0 0 0  Down, Depressed, Hopeless 0 0 0  PHQ - 2 Score 0 0 0    Patient Active Problem List   Diagnosis Date Noted  . Insomnia 11/13/2013  . Degenerative joint disease of cervical spine 11/13/2013   History  reviewed. No pertinent past medical history. Past Surgical History:  Procedure Laterality Date  . CESAREAN SECTION  2003, 2008   No Known Allergies Prior to Admission medications   Medication Sig Start Date End Date Taking? Authorizing Provider  fluconazole (DIFLUCAN) 150 MG tablet Take 1 tablet (150 mg total) by mouth once. Repeat in 1 week is needed 10/28/14   Mancel Bale, PA-C  meloxicam (MOBIC) 15 MG tablet TAKE 1 TABLET (15 MG TOTAL) BY MOUTH DAILY. 09/12/14   Elby Beck, FNP  modafinil (PROVIGIL) 100 MG tablet Take 1 tablet (100 mg  total) by mouth daily. 03/11/15   Mancel Bale, PA-C  nitrofurantoin, macrocrystal-monohydrate, (MACROBID) 100 MG capsule Take 1 capsule (100 mg total) by mouth 2 (two) times daily. 10/28/14   Mancel Bale, PA-C  traZODone (DESYREL) 50 MG tablet TAKE 1 TO 2 TABLETS BY MOUTH AT BEDTIME AS NEEDED  "OFFICE VISIT NEEDED FOR REFILLS' 09/27/16   Mancel Bale, PA-C   Social History   Social History  . Marital status: Married    Spouse name: N/A  . Number of children: N/A  . Years of education: N/A   Occupational History  . Not on file.   Social History Main Topics  . Smoking status: Never Smoker  . Smokeless tobacco: Never Used  . Alcohol use No  . Drug use: No  . Sexual activity: Yes   Other Topics Concern  . Not on file   Social History Narrative  . No narrative on file   Review of Systems  Endocrine: Positive for cold intolerance.    13 point ROS is negative. Objective:  Physical Exam  Constitutional: She is oriented to person, place, and time. She appears well-developed and well-nourished.  HENT:  Head: Normocephalic and atraumatic.  Right Ear: External ear normal.  Left Ear: External ear normal.  Mouth/Throat: Oropharynx is clear and moist.  Eyes: Conjunctivae are normal. Pupils are equal, round, and reactive to light.  Neck: Normal range of motion. Neck supple. No thyromegaly present.  Possible slight prominence of thyroid without apparent nodules on right  Cardiovascular: Normal rate, regular rhythm, normal heart sounds and intact distal pulses.   No murmur heard. Pulmonary/Chest: Effort normal and breath sounds normal. No respiratory distress. She has no wheezes.  Abdominal: Soft. Bowel sounds are normal. There is no tenderness.  Musculoskeletal: Normal range of motion. She exhibits no edema or tenderness.  Lymphadenopathy:    She has no cervical adenopathy.  Neurological: She is alert and oriented to person, place, and time.  Skin: Skin is warm and dry. No rash  noted.  Psychiatric: She has a normal mood and affect. Her behavior is normal. Thought content normal.  Vitals reviewed.   Vitals:   11/10/16 0836  BP: 108/64  Pulse: 65  Resp: 18  Temp: 98.8 F (37.1 C)   Body mass index is 25.79 kg/m. Assessment & Plan:   ROSLIN NORWOOD is a 41 y.o. female Annual physical exam  - -anticipatory guidance as below in AVS, screening labs above. Health maintenance items as above in HPI discussed/recommended as applicable.   - risks/benefits of MMG at her age discussed. She will think about it further, has follow up with OBGYN.   Cold sore - Plan: valACYclovir (VALTREX) 1000 MG tablet  - rare sx's. Valtrex refilled if needed- up to 2 g once, then repeat dose once in 12 hours  Insomnia, unspecified type  -stable with trazodone QHS, with some improvement  in depression vs. Seasonal affective disorder sx's.   DDD (degenerative disc disease), cervical  -tolerating otc tylenol, robaxin QAM. Ok to refill robaxin if needed.   Need for Tdap vaccination - Plan: Tdap vaccine greater than or equal to 7yo IM  Intolerance to cold - Plan: TSH  - prominent thyroid, but no apparent nodular areas. Check tsh, then consider ultrasound.   Encounter for screening for HIV - Plan: HIV antibody  Screening for diabetes mellitus - Plan: Comprehensive metabolic panel  Screening for hyperlipidemia - Plan: Lipid panel   Meds ordered this encounter  Medications  . methocarbamol (ROBAXIN) 500 MG tablet    Sig: Take 500 mg by mouth 4 (four) times daily.  Marland Kitchen DISCONTD: valACYclovir (VALTREX) 1000 MG tablet    Sig: Take 1,000 mg by mouth 2 (two) times daily.  . valACYclovir (VALTREX) 1000 MG tablet    Sig: Take as directed.    Dispense:  30 tablet    Refill:  0   Patient Instructions   I will check a thyroid test, and if abnormal, it may be reasonable to check a thyroid ultrasound.  Let me know if you need further information to make decision about timing of  mammograms.  tdap given today.  Valtrex refilled today. Typically can use up to 2 pills (2000mg ) at onset of symptoms with repeat dose of 2000mg  12 hours later. No change in meds for neck pain or sleep at this time, but let me know if/when you need a refill.     Keeping You Healthy  Get These Tests 1. Blood Pressure- Have your blood pressure checked once a year by your health care provider.  Normal blood pressure is 120/80. 2. Weight- Have your body mass index (BMI) calculated to screen for obesity.  BMI is measure of body fat based on height and weight.  You can also calculate your own BMI at [employer]velBags.it. 3. Cholesterol- Have your cholesterol checked every 5 years starting at age 19 then yearly starting at age 2. 62. Chlamydia, HIV, and other sexually transmitted diseases- Get screened every year until age 70, then within three months of each new sexual provider. 5. Pap Test - Every 1-5 years; discuss with your health care provider. 6. Mammogram- Every 1-2 years starting at age 87--50  Take these medicines  Calcium with Vitamin D-Your body needs 1200 mg of Calcium each day and [phone] IU of Vitamin D daily.  Your body can only absorb 500 mg of Calcium at a time so Calcium must be taken in 2 or 3 divided doses throughout the day.  Multivitamin with folic acid- Once daily if it is possible for you to become pregnant.  Get these Immunizations  Gardasil-Series of three doses; prevents HPV related illness such as genital warts and cervical cancer.  Menactra-Single dose; prevents meningitis.  Tetanus shot- Every 10 years.  Flu shot-Every year.  Take these steps 1. Do not smoke-Your healthcare provider can help you quit.  For tips on how to quit go to www.smokefree.gov or call 1-800 QUITNOW. 2. Be physically active- Exercise 5 days a week for at least 30 minutes.  If you are not already physically active, start slow and gradually work up to 30 minutes of moderate physical  activity.  Examples of moderate activity include walking briskly, dancing, swimming, bicycling, etc. 3. Breast Cancer- A self breast exam every month is important for early detection of breast cancer.  For more information and instruction on self breast exams, ask your healthcare  provider or https://www.patel.info/. 4. Eat a healthy diet- Eat a variety of healthy foods such as fruits, vegetables, whole grains, low fat milk, low fat cheeses, yogurt, lean meats, poultry and fish, beans, nuts, tofu, etc.  For more information go to www. Thenutritionsource.org 5. Drink alcohol in moderation- Limit alcohol intake to one drink or less per day. Never drink and drive. 6. Depression- Your emotional health is as important as your physical health.  If you're feeling down or losing interest in things you normally enjoy please talk to your healthcare provider about being screened for depression. 7. Dental visit- Brush and floss your teeth twice daily; visit your dentist twice a year. 8. Eye doctor- Get an eye exam at least every 2 years. 9. Helmet use- Always wear a helmet when riding a bicycle, motorcycle, rollerblading or skateboarding. 39. Safe sex- If you may be exposed to sexually transmitted infections, use a condom. 11. Seat belts- Seat belts can save your live; always wear one. 12. Smoke/Carbon Monoxide detectors- These detectors need to be installed on the appropriate level of your home. Replace batteries at least once a year. 13. Skin cancer- When out in the sun please cover up and use sunscreen 15 SPF or higher. 14. Violence- If anyone is threatening or hurting you, please tell your healthcare provider.           IF you received an x-ray today, you will receive an invoice from [facility]. Please contact [facility] at [phone] with questions or concerns regarding your invoice.   IF you received labwork today, you will receive an invoice from  [facility]. Please contact [facility] at [phone] with questions or concerns regarding your invoice.   Our billing staff will not be able to assist you with questions regarding bills from these companies.  You will be contacted with the lab results as soon as they are available. The fastest way to get your results is to activate your My Chart account. Instructions are located on the last page of this paperwork. If you have not heard from [place] regarding the results in 2 weeks, please contact this office.       I personally performed the services described in this documentation, which was scribed in my presence. The recorded information has been reviewed and considered for accuracy and completeness, addended by me as needed, and agree with information above.  Signed,   [name], MD [facility].  11/10/16 9:22 AM

## 2016-11-10 NOTE — Patient Instructions (Addendum)
I will check a thyroid test, and if abnormal, it may be reasonable to check a thyroid ultrasound.  Let me know if you need further information to make decision about timing of mammograms.  tdap given today.  Valtrex refilled today. Typically can use up to 2 pills (2000mg ) at onset of symptoms with repeat dose of 2000mg  12 hours later. No change in meds for neck pain or sleep at this time, but let me know if/when you need a refill.     Keeping You Healthy  Get These Tests 1. Blood Pressure- Have your blood pressure checked once a year by your health care provider.  Normal blood pressure is 120/80. 2. Weight- Have your body mass index (BMI) calculated to screen for obesity.  BMI is measure of body fat based on height and weight.  You can also calculate your own BMI at [employer]. 3. Cholesterol- Have your cholesterol checked every 5 years starting at age 60 then yearly starting at age 57. 53. Chlamydia, HIV, and other sexually transmitted diseases- Get screened every year until age 58, then within three months of each new sexual provider. 5. Pap Test - Every 1-5 years; discuss with your health care provider. 6. Mammogram- Every 1-2 years starting at age 42--50  Take these medicines  Calcium with Vitamin D-Your body needs 1200 mg of Calcium each day and [phone] IU of Vitamin D daily.  Your body can only absorb 500 mg of Calcium at a time so Calcium must be taken in 2 or 3 divided doses throughout the day.  Multivitamin with folic acid- Once daily if it is possible for you to become pregnant.  Get these Immunizations  Gardasil-Series of three doses; prevents HPV related illness such as genital warts and cervical cancer.  Menactra-Single dose; prevents meningitis.  Tetanus shot- Every 10 years.  Flu shot-Every year.  Take these steps 1. Do not smoke-Your healthcare provider can help you quit.  For tips on how to quit go to www.smokefree.gov or call 1-800 QUITNOW. 2. Be  physically active- Exercise 5 days a week for at least 30 minutes.  If you are not already physically active, start slow and gradually work up to 30 minutes of moderate physical activity.  Examples of moderate activity include walking briskly, dancing, swimming, bicycling, etc. 3. Breast Cancer- A self breast exam every month is important for early detection of breast cancer.  For more information and instruction on self breast exams, ask your healthcare provider or https://www.patel.info/. 4. Eat a healthy diet- Eat a variety of healthy foods such as fruits, vegetables, whole grains, low fat milk, low fat cheeses, yogurt, lean meats, poultry and fish, beans, nuts, tofu, etc.  For more information go to www. Thenutritionsource.org 5. Drink alcohol in moderation- Limit alcohol intake to one drink or less per day. Never drink and drive. 6. Depression- Your emotional health is as important as your physical health.  If you're feeling down or losing interest in things you normally enjoy please talk to your healthcare provider about being screened for depression. 7. Dental visit- Brush and floss your teeth twice daily; visit your dentist twice a year. 8. Eye doctor- Get an eye exam at least every 2 years. 9. Helmet use- Always wear a helmet when riding a bicycle, motorcycle, rollerblading or skateboarding. 33. Safe sex- If you may be exposed to sexually transmitted infections, use a condom. 11. Seat belts- Seat belts can save your live; always wear one. 12. Smoke/Carbon Monoxide detectors- These detectors need  to be installed on the appropriate level of your home. Replace batteries at least once a year. 13. Skin cancer- When out in the sun please cover up and use sunscreen 15 SPF or higher. 14. Violence- If anyone is threatening or hurting you, please tell your healthcare provider.           IF you received an x-ray today, you will receive an invoice from [facility].  Please contact [facility] at [phone] with questions or concerns regarding your invoice.   IF you received labwork today, you will receive an invoice from [place]. Please contact [facility] at [phone] with questions or concerns regarding your invoice.   Our billing staff will not be able to assist you with questions regarding bills from these companies.  You will be contacted with the lab results as soon as they are available. The fastest way to get your results is to activate your My Chart account. Instructions are located on the last page of this paperwork. If you have not heard from [place] regarding the results in 2 weeks, please contact this office.

## 2016-11-11 LAB — TSH: TSH: 1.65 u[IU]/mL (ref 0.450–4.500)

## 2016-11-11 LAB — COMPREHENSIVE METABOLIC PANEL
A/G RATIO: 1.7 (ref 1.2–2.2)
ALBUMIN: 4.7 g/dL (ref 3.5–5.5)
ALT: 12 IU/L (ref 0–32)
AST: 17 IU/L (ref 0–40)
Alkaline Phosphatase: 43 IU/L (ref 39–117)
BUN / CREAT RATIO: 17 (ref 9–23)
BUN: 12 mg/dL (ref 6–24)
Bilirubin Total: 0.5 mg/dL (ref 0.0–1.2)
CALCIUM: 9.6 mg/dL (ref 8.7–10.2)
CO2: 23 mmol/L (ref 18–29)
Chloride: 101 mmol/L (ref 96–106)
Creatinine, Ser: 0.72 mg/dL (ref 0.57–1.00)
GFR, EST AFRICAN AMERICAN: 120 mL/min/{1.73_m2} (ref >59)
GFR, EST NON AFRICAN AMERICAN: 104 mL/min/{1.73_m2} (ref >59)
GLOBULIN, TOTAL: 2.7 g/dL (ref 1.5–4.5)
Glucose: 81 mg/dL (ref 65–99)
POTASSIUM: 4.1 mmol/L (ref 3.5–5.2)
SODIUM: 139 mmol/L (ref 134–144)
TOTAL PROTEIN: 7.4 g/dL (ref 6.0–8.5)

## 2016-11-11 LAB — LIPID PANEL
CHOL/HDL RATIO: 4.6 ratio — AB (ref 0.0–4.4)
Cholesterol, Total: 238 mg/dL — ABNORMAL HIGH (ref 100–199)
HDL: 52 mg/dL (ref >39)
LDL Calculated: 168 mg/dL — ABNORMAL HIGH (ref 0–99)
Triglycerides: 90 mg/dL (ref 0–149)
VLDL Cholesterol Cal: 18 mg/dL (ref 5–40)

## 2016-11-11 LAB — HIV ANTIBODY (ROUTINE TESTING W REFLEX): HIV Screen 4th Generation wRfx: NONREACTIVE

## 2016-11-14 MED FILL — METHOCARBAMOL 500 MG TABLET: 500 | 30 days supply | Qty: 90 | Fill #1

## 2016-11-16 ENCOUNTER — Encounter: Payer: Self-pay | Admitting: Family Medicine

## 2016-11-16 NOTE — Telephone Encounter (Signed)
Kayla Chaney is requesting the results of her labs.  She is open on mychart.  I replied that the tsh was wnl as that was her request, but if you wanted to review the others with her.Marland KitchenMarland Kitchen

## 2017-01-09 MED FILL — METHOCARBAMOL 500 MG TABLET: 500 | 30 days supply | Qty: 90 | Fill #2

## 2017-02-16 DIAGNOSIS — H5213 Myopia, bilateral: Secondary | ICD-10-CM | POA: Diagnosis not present

## 2017-02-20 MED FILL — traZODone HCL 50 MG TABS: 50 | 90 days supply | Qty: 180 | Fill #1

## 2017-02-20 MED FILL — METHOCARBAMOL 500 MG TABLET: 500 | 30 days supply | Qty: 90 | Fill #3

## 2017-04-03 MED FILL — METHOCARBAMOL 500 MG TABLET: 500 | 30 days supply | Qty: 90 | Fill #4

## 2017-05-18 MED FILL — METHOCARBAMOL 500 MG TABLET: 500 | 30 days supply | Qty: 90 | Fill #5

## 2017-05-24 ENCOUNTER — Encounter: Payer: Self-pay | Admitting: Family Medicine

## 2017-05-24 MED ORDER — METHOCARBAMOL 500 MG PO TABS: 500.0000 mg | ORAL_TABLET | Freq: Three times a day (TID) | ORAL | 1 refills | Status: DC | PRN

## 2017-05-24 NOTE — Telephone Encounter (Signed)
See last physical and mychart message.

## 2017-06-20 MED FILL — METHOCARBAMOL 500 MG TABLET: 500 | 60 days supply | Qty: 180 | Fill #0

## 2017-08-03 ENCOUNTER — Other Ambulatory Visit: Payer: Self-pay | Admitting: Physician Assistant

## 2017-08-03 MED FILL — traZODone HCL 50 MG TABS: 50 | 90 days supply | Qty: 180 | Fill #0

## 2017-08-23 ENCOUNTER — Encounter: Payer: Self-pay | Admitting: Physician Assistant

## 2017-08-31 ENCOUNTER — Ambulatory Visit (INDEPENDENT_AMBULATORY_CARE_PROVIDER_SITE_OTHER): Payer: No Typology Code available for payment source

## 2017-08-31 ENCOUNTER — Encounter: Payer: Self-pay | Admitting: Family Medicine

## 2017-08-31 ENCOUNTER — Ambulatory Visit (INDEPENDENT_AMBULATORY_CARE_PROVIDER_SITE_OTHER): Payer: No Typology Code available for payment source | Admitting: Family Medicine

## 2017-08-31 VITALS — HR 70 | Temp 98.3°F | Resp 16 | Ht 65.0 in | Wt 158.6 lb

## 2017-08-31 DIAGNOSIS — M25571 Pain in right ankle and joints of right foot: Secondary | ICD-10-CM

## 2017-08-31 DIAGNOSIS — M79671 Pain in right foot: Secondary | ICD-10-CM | POA: Diagnosis not present

## 2017-08-31 NOTE — Progress Notes (Signed)
Subjective:  By signing my name below, I, Kayla Chaney, attest that this documentation has been prepared under the direction and in the presence of Kayla Ray, MD. Electronically Signed: Moises Chaney, [place]. 08/31/2017 , 11:40 AM .  Patient was seen in Room 10 .   Patient ID: Kayla Chaney, female    DOB: Aug 08, 1976, 42 y.o.   MRN: [id number] Chief Complaint  Patient presents with  . Foot Pain    patient presents with pain across the top of her R foot. Patient states that she injured herself at [place]   HPI Kayla Chaney is a 42 y.o. female Patient complains of right foot pain that started a day after Zumba class on Nov 12th, 2018. She states she had drastically increased her activity around that time with more walking at work, walking outside of work and also attending Zumba class. She doesn't recall any injuries or twisting during Zumba class on Nov 12th, but next day, she had significant pain. She noticed some bruising mid foot lasting for about a week, as well as pain when squeezing her foot and pushing on her right toes.   She wore a short camwalker boot for 4 weeks. During the first week, she had severe pain with weight bearing. She had temporarily stopped her Zumba and walking. She would ice the area after work. After about 4 weeks, she slowly started walking a track about 1.5 miles in her boot. She still walks in her boot, but still has some soreness. She has stopped wearing her boot when she's at work. She tried doing some lunges recently and noticed pain when putting weight on the ball of her foot.   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   Patient Active Problem List   Diagnosis Date Noted  . Insomnia 11/13/2013  . Degenerative joint disease of cervical spine 11/13/2013   No past medical history on file. Past Surgical History:  Procedure Laterality Date  . CESAREAN SECTION  2003, 2008    No Known Allergies Prior to Admission medications   Medication Sig Start Date End Date Taking? Authorizing Provider  methocarbamol (ROBAXIN) 500 MG tablet Take 1 tablet (500 mg total) by mouth 3 (three) times daily as needed for muscle spasms. 05/24/17   Wendie Agreste, MD  traZODone (DESYREL) 50 MG tablet TAKE 1 TO 2 TABLETS BY MOUTH AT BEDTIME AS NEEDED. "OFFICE VISIT NEEDED FOR REFILLS" 08/03/17   Gale Journey, Damaris Hippo, PA-C  valACYclovir (VALTREX) 1000 MG tablet Take as directed. 11/10/16   Wendie Agreste, MD   Social History   Socioeconomic History  . Marital status: Married    Spouse name: Not on file  . Number of children: Not on file  . Years of education: Not on file  . Highest education level: Not on file  Social Needs  . Financial resource strain: Not on file  . Food insecurity - worry: Not on file  . Food insecurity - inability: Not on file  . Transportation needs - medical: Not on file  . Transportation needs - non-medical: Not on file  Occupational History  . Not on file  Tobacco Use  . Smoking status: Never Smoker  . Smokeless tobacco: Never Used  Substance and Sexual Activity  . Alcohol use: No  . Drug use: No  . Sexual activity: Yes  Other Topics Concern  . Not on file  Social History Narrative  . Not on file   Review of Systems  Constitutional: Negative for chills, fatigue, fever and unexpected weight change.  Respiratory: Negative for cough.   Gastrointestinal: Negative for constipation, diarrhea, nausea and vomiting.  Musculoskeletal: Positive for arthralgias. Negative for gait problem, joint swelling and myalgias.  Skin: Negative for rash and wound.  Neurological: Negative for dizziness, weakness and headaches.       Objective:   Physical Exam  Constitutional: She is oriented to person, place, and time. She appears well-developed and well-nourished. No distress.  HENT:  Head: Normocephalic and atraumatic.  Eyes: EOM are normal. Pupils are equal,  round, and reactive to light.  Neck: Neck supple.  Cardiovascular: Normal rate.  Pulmonary/Chest: Effort normal. No respiratory distress.  Musculoskeletal: Normal range of motion.  Right foot: tenderness along the proximal 4th metatarsal, minimal tenderness proximal 3rd metatarsal, 2nd and 5th metatarsal non tender; minimal tenderness between 3rd and 4th MTP, corresponds to the foreign body in the xray; ankles non tender, navicula non tender, no soft tissue swelling, skin intact, no erythema  Neurological: She is alert and oriented to person, place, and time.  Skin: Skin is warm and dry.  Psychiatric: She has a normal mood and affect. Her behavior is normal.  Nursing note and vitals reviewed.   Vitals:   08/31/17 1040  Pulse: 70  Resp: 16  Temp: 98.3 F (36.8 C)  TempSrc: Oral  SpO2: 99%  Weight: 158 lb 9.6 oz (71.9 kg)  Height: 5\' 5"  (1.651 m)   Dg Foot Complete Right  Result Date: 08/31/2017 CLINICAL DATA:  Right foot pain. EXAM: RIGHT FOOT COMPLETE - 3+ VIEW COMPARISON:  No recent. FINDINGS: Metallic soft tissue foreign body noted over the right third and fourth interspace. No acute bony abnormality identified. No evidence of fracture dislocation. IMPRESSION: Metallic soft tissue foreign body noted between the right third fourth interspace. No acute bony abnormality identified. Electronically Signed   By: Marcello Moores  Register   On: 08/31/2017 10:57       Assessment & Plan:   Kayla Chaney is a 42 y.o. female Pain in joint involving right ankle and foot - Plan: DG Foot Complete Right  Pain of right midfoot Based on history, possible stress injury of fourth metatarsal without signs of stress fracture on imaging. Symptoms did improve with relative rest and temporary walking boot.   - Metallic foreign body minimally tender without signs of infection - doubt any intervention needed for this at present as likely there for some time.  -Slow return to activity with cross training such as  bicycle or elliptical for less stress on foot. Temporary use of walking boot if needed for more symptoms, but would consider orthopedic evaluation versus advanced imaging with MRI if not continuing to improve.  No orders of the defined types were placed in this encounter.  Patient Instructions    Xray overall reassuring. Early stress injury was possible. Ok to continue to modify activity for now, or walking boot temporarily if needed, with slow return to activity as tolerated. If pain persists, can look into ortho eval or MRI to look for other causes if needed. Let me know if there are any questions. Thanks for coming in today.    IF you received an x-Chaney today, you will receive an invoice from [facility]. Please contact [facility] at [phone] with questions or concerns regarding your invoice.   IF you received labwork today, you will receive an invoice from  [facility]. Please contact [facility] at [phone] with questions or concerns regarding your invoice.   Our billing staff will not be able to assist you with questions regarding bills from these companies.  You will be contacted with the lab results as soon as they are available. The fastest way to get your results is to activate your My Chart account. Instructions are located on the last page of this paperwork. If you have not heard from [place] regarding the results in 2 weeks, please contact this office.       I personally performed the services described in this documentation, which was scribed in my presence. The recorded information has been reviewed and considered for accuracy and completeness, addended by me as needed, and agree with information above.  Signed,   Kayla Ray, MD [facility] at [place].  09/01/17 3:22 PM

## 2017-08-31 NOTE — Patient Instructions (Addendum)
  Xray overall reassuring. Early stress injury was possible. Ok to continue to modify activity for now, or walking boot temporarily if needed, with slow return to activity as tolerated. If pain persists, can look into ortho eval or MRI to look for other causes if needed. Let me know if there are any questions. Thanks for coming in today.    IF you received an x-ray today, you will receive an invoice from [facility]. Please contact [facility] at [phone] with questions or concerns regarding your invoice.   IF you received labwork today, you will receive an invoice from [facility]. Please contact [facility] at [phone] with questions or concerns regarding your invoice.   Our billing staff will not be able to assist you with questions regarding bills from these companies.  You will be contacted with the lab results as soon as they are available. The fastest way to get your results is to activate your My Chart account. Instructions are located on the last page of this paperwork. If you have not heard from [place] regarding the results in 2 weeks, please contact this office.

## 2017-09-01 ENCOUNTER — Encounter: Payer: Self-pay | Admitting: Family Medicine

## 2017-09-08 MED FILL — METHOCARBAMOL 500 MG TABS: 500 | 60 days supply | Qty: 180 | Fill #1

## 2017-09-08 MED FILL — SHIPPING COST: 1 days supply | Qty: 1 | Fill #0

## 2017-10-30 ENCOUNTER — Encounter: Payer: Self-pay | Admitting: Physician Assistant

## 2017-10-30 ENCOUNTER — Ambulatory Visit: Payer: Self-pay

## 2017-10-30 ENCOUNTER — Other Ambulatory Visit: Payer: Self-pay

## 2017-10-30 ENCOUNTER — Ambulatory Visit (INDEPENDENT_AMBULATORY_CARE_PROVIDER_SITE_OTHER): Payer: No Typology Code available for payment source | Admitting: Physician Assistant

## 2017-10-30 VITALS — BP 102/64 | HR 96 | Temp 99.0°F | Resp 18 | Ht 65.0 in | Wt 157.0 lb

## 2017-10-30 DIAGNOSIS — R220 Localized swelling, mass and lump, head: Secondary | ICD-10-CM

## 2017-10-30 DIAGNOSIS — T7840XA Allergy, unspecified, initial encounter: Secondary | ICD-10-CM

## 2017-10-30 MED ORDER — PREDNISONE 10 MG PO TABS
ORAL_TABLET | ORAL | 0 refills | Status: DC
Start: 2017-10-30 — End: 2017-11-17

## 2017-10-30 MED ORDER — RANITIDINE HCL 150 MG PO TABS
150.0000 mg | ORAL_TABLET | Freq: Two times a day (BID) | ORAL | 0 refills | Status: DC
Start: 2017-10-30 — End: 2019-01-25

## 2017-10-30 MED FILL — predniSONE 10 MG (21) TBPK: 10 | 6 days supply | Qty: 21 | Fill #0

## 2017-10-30 MED FILL — raNITIdine HCL 150 MG TABS: 150 | 45 days supply | Qty: 90 | Fill #0

## 2017-10-30 NOTE — Telephone Encounter (Signed)
   Reason for Disposition . [1] Looks infected (spreading redness, pus) AND [2] no fever  Answer Assessment - Initial Assessment Questions 1. APPEARANCE of RASH: "Describe the rash."      Eyes swolle 2. LOCATION: "Where is the rash located?"      Face 3. NUMBER: "How many spots are there?"      No 4. SIZE: "How big are the spots?" (Inches, centimeters or compare to size of a coin)      N/AyESTERDAY 5. ONSET: "When did the rash start?"      yESTERDAY 6. ITCHING: "Does the rash itch?" If so, ask: "How bad is the itch?"  (Scale 1-10; or mild, moderate, severe)     None 7. PAIN: "Does the rash hurt?" If so, ask: "How bad is the pain?"  (Scale 1-10; or mild, moderate, severe)     No 8. OTHER SYMPTOMS: "Do you have any other symptoms?" (e.g., fever)     No 9. PREGNANCY: "Is there any chance you are pregnant?" "When was your last menstrual period?"     No  Protocols used: RASH OR REDNESS - LOCALIZED-A-AH

## 2017-10-30 NOTE — Progress Notes (Signed)
Kayla Chaney  MRN: [id number] DOB: 1976/01/29  Subjective:  Kayla Chaney is a 42 y.o. female seen in office today for a chief complaint of bilateral lower eyelid swelling x 2 days. Tried new soap on face 3 days ago. Woke up with bilateral eye swelling the day after. Has associated pruritis, redness, and warmth. Denies fever, chills, pain, eye pain, purulent drainage, shortness of breath, chest pain, trouble swallowing, palpitations, and sore throat. Has tried benadryl, Claritin, and daily cold compresses with relief. Denies smoking. Denies past allergic reaction. No new exposures to facial cream, laundry detergents, eye makeup, and medications.  Review of Systems  Constitutional: Negative for chills, diaphoresis and fatigue.  Eyes: Negative for visual disturbance.  Gastrointestinal: Negative for nausea and vomiting.  Neurological: Negative for dizziness and headaches.    Patient Active Problem List   Diagnosis Date Noted  . Insomnia 11/13/2013  . Degenerative joint disease of cervical spine 11/13/2013    Current Outpatient Medications on File Prior to Visit  Medication Sig Dispense Refill  . methocarbamol (ROBAXIN) 500 MG tablet Take 1 tablet (500 mg total) by mouth 3 (three) times daily as needed for muscle spasms. 180 tablet 1  . traZODone (DESYREL) 50 MG tablet TAKE 1 TO 2 TABLETS BY MOUTH AT BEDTIME AS NEEDED. "OFFICE VISIT NEEDED FOR REFILLS" 180 tablet 0  . valACYclovir (VALTREX) 1000 MG tablet Take as directed. 30 tablet 0   No current facility-administered medications on file prior to visit.     No Known Allergies   Objective:  BP 102/64   Pulse 96   Temp 99 F (37.2 C) (Oral)   Resp 18   Ht 5\' 5"  (1.651 m)   Wt 157 lb (71.2 kg)   SpO2 98%   BMI 26.13 kg/m   Physical Exam  Constitutional: She is oriented to person, place, and time and well-developed, well-nourished, and in no distress.  HENT:  Head: Normocephalic and atraumatic.  Right Ear: Tympanic  membrane, external ear and ear canal normal.  Left Ear: Tympanic membrane, external ear and ear canal normal.  Nose: Nose normal. Right sinus exhibits no maxillary sinus tenderness and no frontal sinus tenderness. Left sinus exhibits no maxillary sinus tenderness and no frontal sinus tenderness.  Mouth/Throat: Uvula is midline, oropharynx is clear and moist and mucous membranes are normal.  Moderate swelling of bilateral lower eyelids with mild erythema and warmth. Erythema extends to bilateral cheeks. No tenderness to palpation. No pain with EOMs.   Eyes: Conjunctivae and EOM are normal. Pupils are equal, round, and reactive to light. Right eye exhibits no discharge. Left eye exhibits no discharge. Right conjunctiva is not injected. Left conjunctiva is not injected.  Neck: Normal range of motion.  Cardiovascular: Normal rate, regular rhythm and normal heart sounds.  Pulmonary/Chest: Effort normal and breath sounds normal. She has no decreased breath sounds. She has no wheezes. She has no rhonchi. She has no rales.  Neurological: She is alert and oriented to person, place, and time. Gait normal.  Skin: Skin is warm and dry.  Psychiatric: Affect normal.  Vitals reviewed.    Temp Readings from Last 3 Encounters:  10/30/17 99 F (37.2 C) (Oral)  08/31/17 98.3 F (36.8 C) (Oral)  11/10/16 98.8 F (37.1 C) (Oral)     Assessment and Plan :  1. Facial swelling 2. Allergic reaction, initial encounter History and physical exam findings consistent with allergic reaction.  Discontinue new soap.  Will treat with daily oral prednisone,  Claritin, Zantac, and Benadryl nighttime. Advised to return to clinic if symptoms worsen, do not improve, or as needed.  - predniSONE (DELTASONE) 10 MG tablet; 6-5-4-3-2-1 taper. Take all the tablets for that day in the am with food.  Dispense: 21 tablet; Refill: 0 - ranitidine (ZANTAC) 150 MG tablet; Take 1 tablet (150 mg total) by mouth 2 (two) times daily.   Dispense: 90 tablet; Refill: 0  Tenna Delaine PA-C  Primary [facility] at [facility] 10/30/2017 9:57 AM

## 2017-10-30 NOTE — Patient Instructions (Addendum)
We are going to treat your underlying inflammation with oral prednisone. Prednisone is a steroid and can cause side effects such as headache, irritability, nausea, vomiting, increased heart rate, increased blood pressure, increased blood sugar, appetite changes, and insomnia. Please take tablets in the morning with a full meal to help decrease the chances of these side effects. You should also use daily claritin, zanctac along with benedryl at night.   I recommend discontinuing use of new soap. You may also want to change pillows if this continues.   If no improvement in symptoms or you develop new fever, pain, or purulent drainage in the eyes, please contact me. Thank you for letting me participate in your health and well being.    IF you received an x-ray today, you will receive an invoice from [facility]. Please contact [facility] at [phone] with questions or concerns regarding your invoice.   IF you received labwork today, you will receive an invoice from [place]. Please contact [facility] at [phone] with questions or concerns regarding your invoice.   Our billing staff will not be able to assist you with questions regarding bills from these companies.  You will be contacted with the lab results as soon as they are available. The fastest way to get your results is to activate your My Chart account. Instructions are located on the last page of this paperwork. If you have not heard from [place] regarding the results in 2 weeks, please contact this office.

## 2017-10-31 ENCOUNTER — Encounter: Payer: Self-pay | Admitting: Physician Assistant

## 2017-11-17 ENCOUNTER — Telehealth: Payer: Self-pay | Admitting: Physician Assistant

## 2017-11-17 ENCOUNTER — Encounter: Payer: Self-pay | Admitting: Physician Assistant

## 2017-11-17 ENCOUNTER — Ambulatory Visit (INDEPENDENT_AMBULATORY_CARE_PROVIDER_SITE_OTHER): Payer: No Typology Code available for payment source | Admitting: Physician Assistant

## 2017-11-17 ENCOUNTER — Other Ambulatory Visit: Payer: Self-pay

## 2017-11-17 VITALS — BP 104/60 | HR 84 | Temp 99.1°F | Resp 18 | Ht 65.0 in | Wt 155.4 lb

## 2017-11-17 DIAGNOSIS — L719 Rosacea, unspecified: Secondary | ICD-10-CM | POA: Diagnosis not present

## 2017-11-17 DIAGNOSIS — Z0000 Encounter for general adult medical examination without abnormal findings: Secondary | ICD-10-CM

## 2017-11-17 DIAGNOSIS — L74512 Primary focal hyperhidrosis, palms: Secondary | ICD-10-CM | POA: Diagnosis not present

## 2017-11-17 DIAGNOSIS — M5030 Other cervical disc degeneration, unspecified cervical region: Secondary | ICD-10-CM

## 2017-11-17 DIAGNOSIS — F5101 Primary insomnia: Secondary | ICD-10-CM | POA: Diagnosis not present

## 2017-11-17 MED ORDER — METHOCARBAMOL 500 MG PO TABS: 500.0000 mg | ORAL_TABLET | Freq: Three times a day (TID) | ORAL | 1 refills | Status: DC | PRN

## 2017-11-17 MED ORDER — METRONIDAZOLE 0.75 % VA GEL
1.0000 | Freq: Every day | VAGINAL | 3 refills | Status: DC
Start: 2017-11-17 — End: 2019-01-25

## 2017-11-17 MED ORDER — TRAZODONE HCL 50 MG PO TABS: 50.0000 mg | ORAL_TABLET | Freq: Every day | ORAL | 4 refills | Status: DC

## 2017-11-17 MED ORDER — ALUMINUM CHLORIDE 20 % EX SOLN: Freq: Every day | CUTANEOUS | 3 refills | Status: DC

## 2017-11-17 MED FILL — SHIPPING COST: 1 days supply | Qty: 1 | Fill #1

## 2017-11-17 MED FILL — DRYSOL DAB-O-MATIC SOLUTION: 20 | 20 days supply | Qty: 35 | Fill #0

## 2017-11-17 MED FILL — traZODone HCL 50 MG TABS: 50 | 90 days supply | Qty: 90 | Fill #0

## 2017-11-17 MED FILL — metroNIDAZOLE 0.75 % GEL: 0.75 | 5 days supply | Qty: 70 | Fill #0

## 2017-11-17 MED FILL — METHOCARBAMOL 500 MG TABS: 500 | 60 days supply | Qty: 180 | Fill #0

## 2017-11-17 NOTE — Progress Notes (Signed)
Kayla Chaney  MRN: [id number] DOB: 1976/03/09  PCP: Mancel Bale, PA-C  Subjective:  Pt presents to clinic for a CPE.  She is doing really well.  She does need her medications refills.  Red face - worse with exercise - thinks it get worse with her rare ETOH intake Palms sweat a lot - embarrassing esp at work with patients.  She uses clinical strength deodorant on axilla and feet area and that helps with her excessive sweating in those areas.    Last dental exam: every 6 months Last vision exam: wear glasses - June yearly Last pap: 2016 - neg HPV -  Last mammo: age 32  Vaccinations - UTD   Typical meals for patient: 3 meals, no snacks Typical beverage choices: water, coffee (with cream) Exercises: 3 times per week for 30 minutes - walk - zumba 2/week Sleeps: 7 hrs per night and sleeping well   Patient Active Problem List   Diagnosis Date Noted  . Insomnia 11/13/2013  . Degenerative joint disease of cervical spine 11/13/2013    Review of Systems  Constitutional: Negative.   HENT: Negative.   Eyes: Negative.   Respiratory: Negative.   Cardiovascular: Negative.   Gastrointestinal: Negative.   Endocrine: Negative.   Genitourinary: Negative.   Musculoskeletal: Negative.   Skin: Positive for rash (red face).       Sweaty palms  Allergic/Immunologic: Negative.   Neurological: Negative.   Hematological: Negative.   Psychiatric/Behavioral: Negative.      Current Outpatient Medications on File Prior to Visit  Medication Sig Dispense Refill  . ranitidine (ZANTAC) 150 MG tablet Take 1 tablet (150 mg total) by mouth 2 (two) times daily. 90 tablet 0  . valACYclovir (VALTREX) 1000 MG tablet Take as directed. 30 tablet 0   No current facility-administered medications on file prior to visit.     No Known Allergies  Social History   Socioeconomic History  . Marital status: Married    Spouse name: Not on file  . Number of children: Not on file  . Years of education:  Not on file  . Highest education level: Not on file  Occupational History  . Not on file  Social Needs  . Financial resource strain: Not on file  . Food insecurity:    Worry: Not on file    Inability: Not on file  . Transportation needs:    Medical: Not on file    Non-medical: Not on file  Tobacco Use  . Smoking status: Never Smoker  . Smokeless tobacco: Never Used  Substance and Sexual Activity  . Alcohol use: No  . Drug use: No  . Sexual activity: Yes  Lifestyle  . Physical activity:    Days per week: Not on file    Minutes per session: Not on file  . Stress: Not on file  Relationships  . Social connections:    Talks on phone: Not on file    Gets together: Not on file    Attends religious service: Not on file    Active member of club or organization: Not on file    Attends meetings of clubs or organizations: Not on file    Relationship status: Not on file  Other Topics Concern  . Not on file  Social History Narrative  . Not on file    Past Surgical History:  Procedure Laterality Date  . CESAREAN SECTION  2003, 2008    Family History  Problem Relation Age of Onset  .  Stroke Mother   . Hypertension Father   . Diabetes Father   . Brain cancer Sister   . Stroke Maternal Grandmother      Objective:  BP 104/60   Pulse 84   Temp 99.1 F (37.3 C) (Oral)   Resp 18   Ht 5\' 5"  (1.651 m)   Wt 155 lb 6.4 oz (70.5 kg)   SpO2 96%   BMI 25.86 kg/m   Physical Exam  Constitutional: She is oriented to person, place, and time and well-developed, well-nourished, and in no distress.  HENT:  Head: Normocephalic and atraumatic.  Right Ear: Hearing, tympanic membrane, external ear and ear canal normal.  Left Ear: Hearing, tympanic membrane, external ear and ear canal normal.  Nose: Nose normal.  Mouth/Throat: Uvula is midline, oropharynx is clear and moist and mucous membranes are normal.  Eyes: Pupils are equal, round, and reactive to light. Conjunctivae and EOM are  normal.  Neck: Trachea normal and normal range of motion. Neck supple. Thyromegaly (right sided) present. No thyroid mass present.  Cardiovascular: Normal rate, regular rhythm and normal heart sounds.  No murmur heard. Pulmonary/Chest: Effort normal and breath sounds normal. She has no wheezes.  Abdominal: Soft. Bowel sounds are normal. There is no tenderness.  Musculoskeletal: Normal range of motion.  Lymphadenopathy:    She has no cervical adenopathy.  Neurological: She is alert and oriented to person, place, and time. She has normal motor skills, normal sensation, normal strength and normal reflexes. Gait normal.  Skin: Skin is warm and dry.  Psychiatric: Mood, memory, affect and judgment normal.    Wt Readings from Last 3 Encounters:  11/17/17 155 lb 6.4 oz (70.5 kg)  10/30/17 157 lb (71.2 kg)  08/31/17 158 lb 9.6 oz (71.9 kg)     Visual Acuity Screening   Right eye Left eye Both eyes  Without correction:     With correction: 20/20 20/20 20/20     Assessment and Plan :  Annual physical exam  Rosacea - Plan: metroNIDAZOLE (METROGEL VAGINAL) 0.75 % vaginal gel  Sweaty palms - Plan: aluminum chloride (DRYSOL) 20 % external solution  Primary insomnia - Plan: traZODone (DESYREL) 50 MG tablet  DDD (degenerative disc disease), cervical - Plan: methocarbamol (ROBAXIN) 500 MG tablet   Pt declines blood work this year.  She had it done last year and she will plan on having it done next year.  She will let me know through mychart how the above medications help her symptoms. Anticipatory guidance given to patient.  Windell Hummingbird PA-C  [facility] at [place] [facility] 11/18/2017 10:24 AM

## 2017-11-17 NOTE — Patient Instructions (Signed)
     IF you received an x-ray today, you will receive an invoice from [facility]. Please contact [place] [facility] at [phone] with questions or concerns regarding your invoice.   IF you received labwork today, you will receive an invoice from [facility]. Please contact [facility] at [phone] with questions or concerns regarding your invoice.   Our billing staff will not be able to assist you with questions regarding bills from these companies.  You will be contacted with the lab results as soon as they are available. The fastest way to get your results is to activate your My Chart account. Instructions are located on the last page of this paperwork. If you have not heard from us regarding the results in 2 weeks, please contact this office.     

## 2017-11-17 NOTE — Telephone Encounter (Signed)
Copied from [place] [id number]. Topic: Quick Communication - See Telephone Encounter >> Nov 17, 2017  4:54 PM Arletha Grippe wrote: CRM for notification. See Telephone encounter for: 11/17/17. Question for metrogel - what is the qty - they want to confirm that  3 boxes is right qty

## 2017-11-17 NOTE — Telephone Encounter (Signed)
Patient called, left detailed VM that the metronidazole gel ordered 210 g/3 refills to use daily. If there are any more questions, contact the pharmacy or call the office back.

## 2017-11-18 ENCOUNTER — Encounter: Payer: Self-pay | Admitting: Physician Assistant

## 2017-11-21 ENCOUNTER — Telehealth: Payer: Self-pay | Admitting: Physician Assistant

## 2017-11-21 NOTE — Telephone Encounter (Signed)
Copied from [place] [id number]. Topic: Quick Communication - See Telephone Encounter >> Nov 17, 2017  4:54 PM Arletha Grippe wrote: CRM for notification. See Telephone encounter for: 11/17/17. [facility] called -  Question for [employer] - what is the qty - they want to confirm that  3 boxes is right qty  Drysol - wrote for 180, 5 boxes, is this the correct qty?  [phone] [facility]

## 2017-11-21 NOTE — Telephone Encounter (Signed)
Provider, please clarify.

## 2017-11-22 ENCOUNTER — Encounter: Payer: Self-pay | Admitting: Physician Assistant

## 2017-11-22 DIAGNOSIS — Z135 Encounter for screening for eye and ear disorders: Secondary | ICD-10-CM

## 2017-11-24 NOTE — Telephone Encounter (Signed)
She needs a 3 month supply of these medications -- 3 boxes of metrogel is correct Drysol should be 3 bottles - (I spaced out and did quantity for pills) sorry

## 2017-11-24 NOTE — Telephone Encounter (Signed)
Called pharmacy and corrected  

## 2018-01-12 ENCOUNTER — Other Ambulatory Visit: Payer: Self-pay | Admitting: Family Medicine

## 2018-01-12 DIAGNOSIS — B001 Herpesviral vesicular dermatitis: Secondary | ICD-10-CM

## 2018-01-15 ENCOUNTER — Encounter: Payer: Self-pay | Admitting: Physician Assistant

## 2018-01-15 DIAGNOSIS — B001 Herpesviral vesicular dermatitis: Secondary | ICD-10-CM

## 2018-01-16 MED ORDER — VALACYCLOVIR HCL 1 G PO TABS: 2000.0000 mg | ORAL_TABLET | Freq: Two times a day (BID) | ORAL | 0 refills | Status: DC

## 2018-01-16 MED FILL — valACYclovir HCL 1 GM TABS: 1 | 7 days supply | Qty: 30 | Fill #0

## 2018-01-16 MED FILL — SHIPPING COST: 1 days supply | Qty: 1 | Fill #2

## 2018-02-02 MED FILL — METHOCARBAMOL 500 MG TABLET: 500 | 60 days supply | Qty: 180 | Fill #1

## 2018-02-02 MED FILL — SHIPPING COST: 1 days supply | Qty: 1 | Fill #3

## 2018-02-26 ENCOUNTER — Encounter: Payer: Self-pay | Admitting: Physician Assistant

## 2018-03-05 ENCOUNTER — Encounter: Payer: Self-pay | Admitting: Physician Assistant

## 2018-04-11 ENCOUNTER — Other Ambulatory Visit: Payer: Self-pay | Admitting: Physician Assistant

## 2018-04-11 DIAGNOSIS — M5030 Other cervical disc degeneration, unspecified cervical region: Secondary | ICD-10-CM

## 2018-04-11 MED FILL — traZODone HCL 50 MG TABS: 50 | 90 days supply | Qty: 90 | Fill #1

## 2018-04-11 MED FILL — SHIPPING COST: 1 days supply | Qty: 1 | Fill #4

## 2018-04-11 MED FILL — METHOCARBAMOL 500 MG TABLET: 500 | 30 days supply | Qty: 90 | Fill #0

## 2018-04-11 NOTE — Telephone Encounter (Signed)
Robaxin 500 mg refill Last Refill:11/17/17 # 180 Last OV: 11/17/17 PCP: [place]

## 2018-05-22 ENCOUNTER — Other Ambulatory Visit: Payer: Self-pay | Admitting: Physician Assistant

## 2018-05-22 DIAGNOSIS — M5030 Other cervical disc degeneration, unspecified cervical region: Secondary | ICD-10-CM

## 2018-05-22 NOTE — Telephone Encounter (Signed)
Patient is requesting a refill of the following medications: Requested Prescriptions   Pending Prescriptions Disp Refills  . methocarbamol (ROBAXIN) 500 MG tablet 90 tablet 0

## 2018-05-23 MED ORDER — METHOCARBAMOL 500 MG PO TABS: 500.0000 mg | ORAL_TABLET | Freq: Three times a day (TID) | ORAL | 0 refills | Status: DC | PRN

## 2018-05-23 NOTE — Telephone Encounter (Signed)
Chart reviewed rx in march F/u instructions were for one year Med refilled She will however need to establish with new PCP as Kayla Chaney is no longer in this practice Please assist with scheduling, within the next 3 months should be ok thanks

## 2018-05-24 MED FILL — SHIPPING COST: 1 days supply | Qty: 1 | Fill #5

## 2018-05-24 MED FILL — METHOCARBAMOL 500 MG TABLET: 500 | 30 days supply | Qty: 90 | Fill #0

## 2018-06-27 ENCOUNTER — Other Ambulatory Visit: Payer: Self-pay | Admitting: Family Medicine

## 2018-06-27 DIAGNOSIS — M5030 Other cervical disc degeneration, unspecified cervical region: Secondary | ICD-10-CM

## 2018-06-27 MED FILL — traZODone HCL 50 MG TABS: 50 | 90 days supply | Qty: 90 | Fill #2

## 2018-06-27 MED FILL — SHIPPING COST: 1 days supply | Qty: 1 | Fill #6

## 2018-06-27 NOTE — Telephone Encounter (Signed)
Requested medication (s) are due for refill today: yes  Requested medication (s) are on the active medication list: yes  Last refill:  05/23/18 #90  Future visit scheduled: no  Notes to clinic:  Also see MyChart message on 06/27/18. Last OV on 11/17/17 with Smith Robert   Requested Prescriptions  Pending Prescriptions Disp Refills   methocarbamol (ROBAXIN) 500 MG tablet [Pharmacy Med Name: METHOCARBAMOL 500 MG TABLET 500 TAB] 90 tablet 0    Sig: Take 1 tablet (500 mg total) by mouth every 8 (eight) hours as needed for muscle spasms.     Not Delegated - Analgesics:  Muscle Relaxants Failed - 06/27/2018  5:34 PM      Failed - This refill cannot be delegated      Failed - Valid encounter within last 6 months    Recent Outpatient Visits          7 months ago Annual physical exam   [facility] [place], PA-C   8 months ago Facial swelling   [facility] [place], [place] D, PA-C   10 months ago Pain in joint involving right ankle and foot   [facility], Ranell Patrick, MD   1 year ago Annual physical exam   [facility], Ranell Patrick, MD   3 years ago Abnormal urine odor   [facility] [place], PA-C

## 2018-06-28 MED ORDER — METHOCARBAMOL 500 MG PO TABS: 500.0000 mg | ORAL_TABLET | Freq: Three times a day (TID) | ORAL | 0 refills | Status: DC | PRN

## 2018-06-28 MED FILL — METHOCARBAMOL 500 MG TABLET: 500 | 30 days supply | Qty: 90 | Fill #0

## 2018-07-30 MED FILL — SHIPPING COST: 1 days supply | Qty: 1 | Fill #7

## 2018-07-30 MED FILL — METHOCARBAMOL 500 MG TABLET: 500 | 90 days supply | Qty: 180 | Fill #0

## 2018-09-10 ENCOUNTER — Encounter: Payer: Self-pay | Admitting: Physician Assistant

## 2018-09-13 ENCOUNTER — Other Ambulatory Visit: Payer: Self-pay | Admitting: *Deleted

## 2018-09-13 DIAGNOSIS — F5101 Primary insomnia: Secondary | ICD-10-CM

## 2018-09-13 DIAGNOSIS — B001 Herpesviral vesicular dermatitis: Secondary | ICD-10-CM

## 2018-09-13 MED ORDER — VALACYCLOVIR HCL 1 G PO TABS: 2000.0000 mg | ORAL_TABLET | Freq: Two times a day (BID) | ORAL | 0 refills | Status: DC

## 2018-09-13 MED ORDER — TRAZODONE HCL 50 MG PO TABS: 50.0000 mg | ORAL_TABLET | Freq: Every day | ORAL | 0 refills | Status: DC

## 2018-09-13 MED FILL — traZODone HCL 50 MG TABS: 50 | 90 days supply | Qty: 90 | Fill #0

## 2018-09-13 MED FILL — valACYclovir HCL 1 GM TABS: 1 | 7 days supply | Qty: 30 | Fill #0

## 2018-09-13 MED FILL — SHIPPING COST: 1 days supply | Qty: 1 | Fill #0

## 2018-10-16 ENCOUNTER — Other Ambulatory Visit: Payer: Self-pay | Admitting: Family Medicine

## 2018-10-16 DIAGNOSIS — M5030 Other cervical disc degeneration, unspecified cervical region: Secondary | ICD-10-CM

## 2018-10-16 MED ORDER — METHOCARBAMOL 500 MG PO TABS: 500.0000 mg | ORAL_TABLET | Freq: Three times a day (TID) | ORAL | 0 refills | Status: DC | PRN

## 2018-10-16 NOTE — Telephone Encounter (Signed)
No further refills until appt

## 2018-10-19 MED FILL — METHOCARBAMOL 500 MG TABLET: 500 | 10 days supply | Qty: 30 | Fill #0

## 2018-10-19 MED FILL — SHIPPING COST: 1 days supply | Qty: 1 | Fill #1

## 2018-11-01 ENCOUNTER — Other Ambulatory Visit: Payer: Self-pay | Admitting: *Deleted

## 2018-11-01 ENCOUNTER — Encounter: Payer: Self-pay | Admitting: Emergency Medicine

## 2018-11-01 DIAGNOSIS — M5030 Other cervical disc degeneration, unspecified cervical region: Secondary | ICD-10-CM

## 2018-11-01 MED ORDER — METHOCARBAMOL 500 MG PO TABS: 500.0000 mg | ORAL_TABLET | Freq: Three times a day (TID) | ORAL | 0 refills | Status: DC | PRN

## 2018-11-01 MED FILL — METHOCARBAMOL 500 MG TABLET: 500 | 20 days supply | Qty: 60 | Fill #0

## 2018-11-01 MED FILL — SHIPPING COST: 1 days supply | Qty: 1 | Fill #2

## 2018-11-23 ENCOUNTER — Encounter: Payer: No Typology Code available for payment source | Admitting: Emergency Medicine

## 2018-11-24 MED FILL — METHOCARBAMOL 500 MG TABLET: 500 | 30 days supply | Qty: 90 | Fill #0

## 2018-12-24 ENCOUNTER — Other Ambulatory Visit: Payer: Self-pay | Admitting: Family Medicine

## 2018-12-24 DIAGNOSIS — F5101 Primary insomnia: Secondary | ICD-10-CM

## 2018-12-24 MED FILL — traZODone HCL 50 MG TABS: 50 | 90 days supply | Qty: 90 | Fill #0

## 2019-01-07 ENCOUNTER — Other Ambulatory Visit: Payer: Self-pay | Admitting: Family Medicine

## 2019-01-07 DIAGNOSIS — M5030 Other cervical disc degeneration, unspecified cervical region: Secondary | ICD-10-CM

## 2019-01-07 NOTE — Telephone Encounter (Signed)
Requested medication (s) are due for refill today - yes  Requested medication (s) are on the active medication list -yes  Future visit scheduled -yes  Last refill: 11/01/18  Notes to clinic: Patient is requesting a refill of a non delegated Rx- sent for review if request  Requested Prescriptions  Pending Prescriptions Disp Refills   methocarbamol (ROBAXIN) 500 MG tablet [Pharmacy Med Name: METHOCARBAMOL 500 MG TABLET 500 TAB] 90 tablet 0    Sig: TAKE 1 TABLET BY MOUTH EVERY 8 HOURS AS NEEDED FOR MUSCLE SPASMS.     Not Delegated - Analgesics:  Muscle Relaxants Failed - 01/07/2019  7:10 AM      Failed - This refill cannot be delegated      Failed - Valid encounter within last 6 months    Recent Outpatient Visits          1 year ago Annual physical exam   [facility] [place], PA-C   1 year ago Facial swelling   [facility] [place], [place] D, PA-C   1 year ago Pain in joint involving right ankle and foot   [facility], Ranell Patrick, MD   2 years ago Annual physical exam   [facility], Ranell Patrick, MD   4 years ago Abnormal urine odor   [facility] [place], PA-C      Future Appointments            In 2 weeks Rutherford Guys, MD [facility] [place], [facility]            Requested Prescriptions  Pending Prescriptions Disp Refills   methocarbamol (ROBAXIN) 500 MG tablet [Pharmacy Med Name: METHOCARBAMOL 500 MG TABLET 500 TAB] 90 tablet 0    Sig: TAKE 1 TABLET BY MOUTH EVERY 8 HOURS AS NEEDED FOR MUSCLE SPASMS.     Not Delegated - Analgesics:  Muscle Relaxants Failed - 01/07/2019  7:10 AM      Failed - This refill cannot be delegated      Failed - Valid encounter within last 6 months    Recent Outpatient Visits          1 year ago Annual physical exam   [facility] [place], PA-C   1 year ago Facial swelling   [facility] [place], [place] D, PA-C   1 year ago Pain  in joint involving right ankle and foot   [facility], Ranell Patrick, MD   2 years ago Annual physical exam   [facility], Ranell Patrick, MD   4 years ago Abnormal urine odor   [facility], Damaris Hippo, PA-C      Future Appointments            In 2 weeks Rutherford Guys, MD [facility] at [place], [facility]

## 2019-01-08 NOTE — Telephone Encounter (Signed)
Patient is requesting a refill of the following medications: Requested Prescriptions   Pending Prescriptions Disp Refills  . methocarbamol (ROBAXIN) 500 MG tablet [Pharmacy Med Name: METHOCARBAMOL 500 MG TABLET 500 TAB] 90 tablet 0    Sig: TAKE 1 TABLET BY MOUTH EVERY 8 HOURS AS NEEDED FOR MUSCLE SPASMS.

## 2019-01-10 MED FILL — METHOCARBAMOL 500 MG TABLET: 500 | 30 days supply | Qty: 90 | Fill #0

## 2019-01-25 ENCOUNTER — Encounter: Payer: Self-pay | Admitting: Family Medicine

## 2019-01-25 ENCOUNTER — Ambulatory Visit (INDEPENDENT_AMBULATORY_CARE_PROVIDER_SITE_OTHER): Payer: No Typology Code available for payment source | Admitting: Family Medicine

## 2019-01-25 ENCOUNTER — Other Ambulatory Visit: Payer: Self-pay

## 2019-01-25 ENCOUNTER — Other Ambulatory Visit (HOSPITAL_COMMUNITY)
Admission: RE | Admit: 2019-01-25 | Discharge: 2019-01-25 | Disposition: A | Discharge status: Home / Self Care | Payer: No Typology Code available for payment source | Source: Ambulatory Visit | Attending: Emergency Medicine | Admitting: Emergency Medicine

## 2019-01-25 VITALS — BP 126/75 | HR 67 | Temp 99.0°F | Ht 65.0 in | Wt 162.0 lb

## 2019-01-25 DIAGNOSIS — E559 Vitamin D deficiency, unspecified: Secondary | ICD-10-CM

## 2019-01-25 DIAGNOSIS — Z1329 Encounter for screening for other suspected endocrine disorder: Secondary | ICD-10-CM | POA: Diagnosis not present

## 2019-01-25 DIAGNOSIS — Z01419 Encounter for gynecological examination (general) (routine) without abnormal findings: Secondary | ICD-10-CM

## 2019-01-25 DIAGNOSIS — Z1151 Encounter for screening for human papillomavirus (HPV): Secondary | ICD-10-CM | POA: Insufficient documentation

## 2019-01-25 DIAGNOSIS — Z01411 Encounter for gynecological examination (general) (routine) with abnormal findings: Secondary | ICD-10-CM

## 2019-01-25 DIAGNOSIS — Z13228 Encounter for screening for other metabolic disorders: Secondary | ICD-10-CM | POA: Diagnosis not present

## 2019-01-25 DIAGNOSIS — M5030 Other cervical disc degeneration, unspecified cervical region: Secondary | ICD-10-CM

## 2019-01-25 DIAGNOSIS — Z13220 Encounter for screening for lipoid disorders: Secondary | ICD-10-CM

## 2019-01-25 LAB — LIPID PANEL

## 2019-01-25 MED ORDER — METHOCARBAMOL 500 MG PO TABS: 500.0000 mg | ORAL_TABLET | Freq: Three times a day (TID) | ORAL | 3 refills | Status: DC | PRN

## 2019-01-25 NOTE — Progress Notes (Signed)
5/29/20203:38 PM  Kayla Chaney Mar 10, 1976, 43 y.o., female [id number]  Chief Complaint  Patient presents with  . Annual Exam    HPI:   Patient is a 43 y.o. female with past medical history significant for cervical DDD who presents today for CPE  Pap: 2016, neg. Denies abnormal STD: declines, neg HIV 2018 BC : IUD, placed 3 years ago Menses: spotting 2/2 IUD Mammogram: had a normal at age 68, waiting until age 49 FHX breast/ovarian cancer: denies FHx colon cancer: denies Exercise/diet: zumba twice a day and taebo at least once a week and yoga, has gained weight since covid 19 Does not smoke, rare etoh, no illicit drug use Dentist twice a year, due in june Eyes once a year, due in june  Most Recent Immunizations  Administered Date(s) Administered  . Influenza-Unspecified 05/29/2016  . Tdap 11/10/2016    Fall Risk  01/25/2019 11/17/2017 10/30/2017 08/31/2017 11/10/2016  Falls in the past year? 0 No No No No  Number falls in past yr: 0 - - - -  Injury with Fall? 0 - - - -     Depression screen [facility] 2/9 01/25/2019 11/17/2017 10/30/2017  Decreased Interest 0 0 0  Down, Depressed, Hopeless 0 0 0  PHQ - 2 Score 0 0 0    No Known Allergies  Prior to Admission medications   Medication Sig Start Date End Date Taking? Authorizing Provider  methocarbamol (ROBAXIN) 500 MG tablet Take 1 tablet (500 mg total) by mouth every 8 (eight) hours as needed for muscle spasms. Please keep upcoming office visit 01/09/19  Yes Rutherford Guys, MD  traZODone (DESYREL) 50 MG tablet TAKE 1 TABLET BY MOUTH AT BEDTIME 12/24/18  Yes Rutherford Guys, MD  valACYclovir (VALTREX) 1000 MG tablet Take 2 tablets (2,000 mg total) by mouth 2 (two) times daily. For 1 day per outbreak 09/13/18  Yes Forrest Moron, MD    History reviewed. No pertinent past medical history.  Past Surgical History:  Procedure Laterality Date  . CESAREAN SECTION  2003, 2008    Social History   Tobacco Use  . Smoking status:  Never Smoker  . Smokeless tobacco: Never Used  Substance Use Topics  . Alcohol use: No    Family History  Problem Relation Age of Onset  . Stroke Mother   . Hypertension Father   . Diabetes Father   . Brain cancer Sister   . Stroke Maternal Grandmother     Review of Systems  Constitutional: Negative for chills and fever.  Respiratory: Negative for cough and shortness of breath.   Cardiovascular: Negative for chest pain, palpitations and leg swelling.  Gastrointestinal: Negative for abdominal pain, nausea and vomiting.  Genitourinary:       Neg breast lumps or nipple discharge Neg vaginal discharge, pelvic pain, dyspareunia, abnormal vaginal bleeding  Musculoskeletal: Positive for neck pain (controlled with methocarbamol and tylenol).  Psychiatric/Behavioral: The patient has insomnia (controlled with trazadone).   All other systems reviewed and are negative.    OBJECTIVE:  Today's Vitals   01/25/19 1521  BP: 126/75  Pulse: 67  Temp: 99 F (37.2 C)  TempSrc: Oral  SpO2: 97%  Weight: 162 lb (73.5 kg)  Height: '5\' 5"'  (1.651 m)   Body mass index is 26.96 kg/m.   Physical Exam Vitals signs and nursing note reviewed. Exam conducted with a chaperone present.  Constitutional:      Appearance: She is well-developed.  HENT:     Head:  Normocephalic and atraumatic.     Right Ear: Hearing, tympanic membrane, ear canal and external ear normal.     Left Ear: Hearing, tympanic membrane, ear canal and external ear normal.  Eyes:     Conjunctiva/sclera: Conjunctivae normal.     Pupils: Pupils are equal, round, and reactive to light.  Neck:     Musculoskeletal: Neck supple.     Thyroid: No thyromegaly.  Cardiovascular:     Rate and Rhythm: Normal rate and regular rhythm.     Heart sounds: Normal heart sounds. No murmur. No friction rub. No gallop.   Pulmonary:     Effort: Pulmonary effort is normal.     Breath sounds: Normal breath sounds. No wheezing, rhonchi or rales.   Chest:     Breasts: Breasts are symmetrical.        Right: No inverted nipple, mass, nipple discharge, skin change or tenderness.        Left: No inverted nipple, mass, nipple discharge, skin change or tenderness.  Abdominal:     General: Bowel sounds are normal. There is no distension.     Palpations: Abdomen is soft.     Tenderness: There is no abdominal tenderness. There is no guarding.  Genitourinary:    Labia:        Right: No rash or lesion.        Left: No rash or lesion.      Vagina: No vaginal discharge or erythema.     Cervix: No cervical motion tenderness, discharge or friability.     Uterus: Not enlarged, not fixed and not tender.      Adnexa:        Right: No mass or tenderness.         Left: No mass or tenderness.       Comments: IUD strings visualized at os Musculoskeletal: Normal range of motion.  Lymphadenopathy:     Cervical: No cervical adenopathy.     Upper Body:     Right upper body: No supraclavicular adenopathy.     Left upper body: No supraclavicular adenopathy.  Skin:    General: Skin is warm and dry.  Neurological:     Mental Status: She is alert and oriented to person, place, and time.     Deep Tendon Reflexes: Reflexes are normal and symmetric.     ASSESSMENT and PLAN  1. Women's annual routine gynecological examination No concerns per history or exam. Routine HCM labs ordered. HCM reviewed/discussed. Anticipatory guidance regarding healthy weight, lifestyle and choices given.  - Cytology - PAP([place])  2. Vitamin D deficiency - VITAMIN D 25 Hydroxy (Vit-D Deficiency, Fractures)  3. Encounter for screening for metabolic disorder - TZG01+VCBS  4. Screening for thyroid disorder - TSH  5. Screening for lipid disorders - Lipid panel  6. DDD (degenerative disc disease), cervical - methocarbamol (ROBAXIN) 500 MG tablet; Take 1 tablet (500 mg total) by mouth every 8 (eight) hours as needed for muscle spasms.  Return in about 1 year  (around 01/25/2020).    Rutherford Guys, MD [facility] at [place], [place] [address] Ph.  [phone] Fax [phone]

## 2019-01-26 LAB — CMP14+EGFR
ALT: 18 IU/L (ref 0–32)
AST: 17 IU/L (ref 0–40)
Albumin/Globulin Ratio: 2 (ref 1.2–2.2)
Albumin: 4.5 g/dL (ref 3.8–4.8)
Alkaline Phosphatase: 42 IU/L (ref 39–117)
BUN/Creatinine Ratio: 13 (ref 9–23)
BUN: 10 mg/dL (ref 6–24)
Bilirubin Total: 0.4 mg/dL (ref 0.0–1.2)
CO2: 22 mmol/L (ref 20–29)
Calcium: 9 mg/dL (ref 8.7–10.2)
Chloride: 103 mmol/L (ref 96–106)
Creatinine, Ser: 0.77 mg/dL (ref 0.57–1.00)
GFR calc Af Amer: 109 mL/min/{1.73_m2} (ref >59)
GFR calc non Af Amer: 95 mL/min/{1.73_m2} (ref >59)
Globulin, Total: 2.2 g/dL (ref 1.5–4.5)
Glucose: 82 mg/dL (ref 65–99)
Potassium: 4 mmol/L (ref 3.5–5.2)
Sodium: 138 mmol/L (ref 134–144)
Total Protein: 6.7 g/dL (ref 6.0–8.5)

## 2019-01-26 LAB — LIPID PANEL
Chol/HDL Ratio: 4.5 ratio — ABNORMAL HIGH (ref 0.0–4.4)
Cholesterol, Total: 222 mg/dL — ABNORMAL HIGH (ref 100–199)
HDL: 49 mg/dL (ref >39)
LDL Calculated: 150 mg/dL — ABNORMAL HIGH (ref 0–99)
Triglycerides: 117 mg/dL (ref 0–149)
VLDL Cholesterol Cal: 23 mg/dL (ref 5–40)

## 2019-01-26 LAB — TSH: TSH: 1.5 u[IU]/mL (ref 0.450–4.500)

## 2019-01-26 LAB — VITAMIN D 25 HYDROXY (VIT D DEFICIENCY, FRACTURES): Vit D, 25-Hydroxy: 38.5 ng/mL (ref 30.0–100.0)

## 2019-01-31 LAB — CYTOLOGY - PAP
Diagnosis: NEGATIVE
HPV: NOT DETECTED

## 2019-02-04 ENCOUNTER — Encounter: Payer: Self-pay | Admitting: Family Medicine

## 2019-02-11 MED FILL — METHOCARBAMOL 500 MG TABLET: 500 | 30 days supply | Qty: 90 | Fill #0

## 2019-03-18 MED FILL — METHOCARBAMOL 500 MG TABS: 500 | 30 days supply | Qty: 90 | Fill #1

## 2019-03-29 ENCOUNTER — Other Ambulatory Visit: Payer: Self-pay | Admitting: Family Medicine

## 2019-03-29 DIAGNOSIS — F5101 Primary insomnia: Secondary | ICD-10-CM

## 2019-03-29 MED FILL — traZODone HCL 50 MG TABS: 50 | 90 days supply | Qty: 90 | Fill #0

## 2019-03-29 NOTE — Telephone Encounter (Signed)
Requested Prescriptions  Pending Prescriptions Disp Refills  . traZODone (DESYREL) 50 MG tablet [Pharmacy Med Name: traZODone HCL 50 MG TABS 50 TAB] 90 tablet 1    Sig: TAKE 1 TABLET BY MOUTH AT BEDTIME     Psychiatry: Antidepressants - Serotonin Modulator Failed - 03/29/2019  8:42 AM      Failed - Completed PHQ-2 or PHQ-9 in the last 360 days.      Passed - Valid encounter within last 6 months    Recent Outpatient Visits          2 months ago Women's annual routine gynecological examination   [facility], Lilia Argue, MD   1 year ago Annual physical exam   [facility] at [place], PA-C   1 year ago Facial swelling   [facility] [place], [place] D, PA-C   1 year ago Pain in joint involving right ankle and foot   [facility], Ranell Patrick, MD   2 years ago Annual physical exam   [facility], Ranell Patrick, MD

## 2019-04-12 ENCOUNTER — Ambulatory Visit: Payer: No Typology Code available for payment source | Admitting: Family Medicine

## 2019-04-12 ENCOUNTER — Encounter: Payer: Self-pay | Admitting: Family Medicine

## 2019-04-15 ENCOUNTER — Encounter: Payer: Self-pay | Admitting: Family Medicine

## 2019-04-22 ENCOUNTER — Encounter: Payer: Self-pay | Admitting: Family Medicine

## 2019-04-23 MED FILL — METHOCARBAMOL 500 MG TABS: 500 | 30 days supply | Qty: 90 | Fill #2

## 2019-04-24 NOTE — Telephone Encounter (Signed)
Pt was sent a msg via MyChart when appt request was made and she was called and left a voicemail. Pt states she was at work and did not receive the notifications. Pt wants the "no show" status taken off her record. Please advise

## 2019-05-23 ENCOUNTER — Other Ambulatory Visit: Payer: Self-pay | Admitting: Family Medicine

## 2019-05-23 DIAGNOSIS — B001 Herpesviral vesicular dermatitis: Secondary | ICD-10-CM

## 2019-05-23 MED ORDER — VALACYCLOVIR HCL 1 G PO TABS: 2000.0000 mg | ORAL_TABLET | Freq: Two times a day (BID) | ORAL | 2 refills | Status: DC

## 2019-05-23 MED FILL — METHOCARBAMOL 500 MG TABS: 500 | 30 days supply | Qty: 90 | Fill #3

## 2019-05-23 MED FILL — valACYclovir HCL 1 GM TABS: 1 | 7 days supply | Qty: 28 | Fill #0

## 2019-06-28 ENCOUNTER — Other Ambulatory Visit: Payer: Self-pay | Admitting: Family Medicine

## 2019-06-28 DIAGNOSIS — M5030 Other cervical disc degeneration, unspecified cervical region: Secondary | ICD-10-CM

## 2019-06-28 MED FILL — traZODone HCL 50 MG TABS: 50 | 90 days supply | Qty: 90 | Fill #1

## 2019-06-28 NOTE — Telephone Encounter (Signed)
Requested medication (s) are due for refill today: yes  Requested medication (s) are on the active medication list: yes  Last refill: 05/23/2019  Future visit scheduled: no  Notes to clinic: refill cannot be delegated    Requested Prescriptions  Pending Prescriptions Disp Refills   methocarbamol (ROBAXIN) 500 MG tablet [Pharmacy Med Name: METHOCARBAMOL 500 MG TABS 500 Tablet] 90 tablet 3    Sig: TAKE 1 TABLET BY MOUTH EVERY 8 HOURS AS NEEDED FOR MUSCLE SPASMS     Not Delegated - Analgesics:  Muscle Relaxants Failed - 06/28/2019  8:16 AM      Failed - This refill cannot be delegated      Passed - Valid encounter within last 6 months    Recent Outpatient Visits          5 months ago Women's annual routine gynecological examination   [facility], Lilia Argue, MD   1 year ago Annual physical exam   [facility] at [place], PA-C   1 year ago Facial swelling   [facility] at [place], [place] D, PA-C   1 year ago Pain in joint involving right ankle and foot   [facility], Ranell Patrick, MD   2 years ago Annual physical exam   [facility], Ranell Patrick, MD

## 2019-07-02 ENCOUNTER — Encounter: Payer: Self-pay | Admitting: Family Medicine

## 2019-07-03 MED FILL — METHOCARBAMOL 500 MG TABLET: 500 | 30 days supply | Qty: 90 | Fill #0

## 2019-08-01 ENCOUNTER — Other Ambulatory Visit: Payer: Self-pay | Admitting: Family Medicine

## 2019-08-01 DIAGNOSIS — M5030 Other cervical disc degeneration, unspecified cervical region: Secondary | ICD-10-CM

## 2019-08-04 ENCOUNTER — Other Ambulatory Visit: Payer: Self-pay | Admitting: Family Medicine

## 2019-08-04 DIAGNOSIS — M5030 Other cervical disc degeneration, unspecified cervical region: Secondary | ICD-10-CM

## 2019-08-06 ENCOUNTER — Encounter: Payer: Self-pay | Admitting: Family Medicine

## 2019-08-06 ENCOUNTER — Other Ambulatory Visit: Payer: Self-pay | Admitting: Family Medicine

## 2019-08-06 DIAGNOSIS — M5030 Other cervical disc degeneration, unspecified cervical region: Secondary | ICD-10-CM

## 2019-08-06 MED ORDER — METHOCARBAMOL 500 MG PO TABS: ORAL_TABLET | ORAL | 5 refills | Status: DC

## 2019-08-06 MED FILL — METHOCARBAMOL 500 MG TABS: 500 | 30 days supply | Qty: 90 | Fill #0

## 2019-08-06 NOTE — Telephone Encounter (Signed)
Robaxin 500 mg # 90 with 5 refills filled on 08/06/2019.

## 2019-08-13 ENCOUNTER — Encounter: Payer: Self-pay | Admitting: Family Medicine

## 2019-08-13 ENCOUNTER — Other Ambulatory Visit: Payer: Self-pay

## 2019-08-13 ENCOUNTER — Ambulatory Visit (INDEPENDENT_AMBULATORY_CARE_PROVIDER_SITE_OTHER): Payer: No Typology Code available for payment source | Admitting: Family Medicine

## 2019-08-13 VITALS — BP 134/80 | HR 74 | Temp 97.9°F | Ht 65.0 in | Wt 162.0 lb

## 2019-08-13 DIAGNOSIS — I499 Cardiac arrhythmia, unspecified: Secondary | ICD-10-CM

## 2019-08-13 NOTE — Progress Notes (Signed)
12/15/20209:03 AM  Ariaun[place] Viona Gilmore Feb 13, 1976, 43 y.o., female [id number]  Chief Complaint  Patient presents with  . Irregular Heart Beat    says her heart has been pounding, no pain, stress or anxiety    HPI:   Patient is a 43 y.o. female who presents today for palpitations  Has been having intermittent episodes of heart pounding, like she has been skipping beat, normal heart rate Thought it was related to black cohosh but despite stopping symptoms have continued Over the past week they have become daily, lasting for longer periods of time No SOB, diaphoresis, chest pain, dizziness, fatigued, nausea, normal HR during these times No heart burn, abd pain, normal BM No rashes No edema She exercises regualrly  Depression screen [facility] 2/9 08/13/2019 01/25/2019 11/17/2017  Decreased Interest 0 0 0  Down, Depressed, Hopeless 0 0 0  PHQ - 2 Score 0 0 0    Fall Risk  08/13/2019 01/25/2019 11/17/2017 10/30/2017 08/31/2017  Falls in the past year? 0 0 No No No  Number falls in past yr: 0 0 - - -  Injury with Fall? 0 0 - - -     No Known Allergies  Prior to Admission medications   Medication Sig Start Date End Date Taking? Authorizing Provider  methocarbamol (ROBAXIN) 500 MG tablet TAKE 1 TABLET BY MOUTH EVERY 8 HOURS AS NEEDED FOR MUSCLE SPASMS 08/06/19  Yes Rutherford Guys, MD  traZODone (DESYREL) 50 MG tablet TAKE 1 TABLET BY MOUTH AT BEDTIME 03/29/19  Yes Rutherford Guys, MD  valACYclovir (VALTREX) 1000 MG tablet Take 2 tablets (2,000 mg total) by mouth 2 (two) times daily. For 1 day per outbreak 05/23/19  Yes Rutherford Guys, MD    History reviewed. No pertinent past medical history.  Past Surgical History:  Procedure Laterality Date  . CESAREAN SECTION  2003, 2008    Social History   Tobacco Use  . Smoking status: Never Smoker  . Smokeless tobacco: Never Used  Substance Use Topics  . Alcohol use: No    Family History  Problem Relation Age of Onset  . Stroke Mother     . Hypertension Father   . Diabetes Father   . Brain cancer Sister   . Stroke Maternal Grandmother     ROS Per hpi  OBJECTIVE:  Today's Vitals   08/13/19 0845  BP: 134/80  Pulse: 74  Temp: 97.9 F (36.6 C)  SpO2: 100%  Weight: 162 lb (73.5 kg)  Height: 5\' 5"  (1.651 m)   Body mass index is 26.96 kg/m.   Physical Exam Vitals and nursing note reviewed.  Constitutional:      Appearance: She is well-developed.  HENT:     Head: Normocephalic and atraumatic.     Mouth/Throat:     Pharynx: No oropharyngeal exudate.  Eyes:     General: No scleral icterus.    Conjunctiva/sclera: Conjunctivae normal.     Pupils: Pupils are equal, round, and reactive to light.  Cardiovascular:     Rate and Rhythm: Normal rate and regular rhythm.     Heart sounds: Normal heart sounds. No murmur. No friction rub. No gallop.   Pulmonary:     Effort: Pulmonary effort is normal.     Breath sounds: Normal breath sounds. No wheezing or rales.  Musculoskeletal:     Cervical back: Neck supple.     Right lower leg: No edema.     Left lower leg: No edema.  Skin:  General: Skin is warm and dry.  Neurological:     Mental Status: She is alert and oriented to person, place, and time.     No results found for this or any previous visit (from the past 24 hour(s)).  No results found.  My interpretation of EKG:  NSR, HR 73, normal intervals  ASSESSMENT and PLAN  1. Irregular heartbeat Normal EKG in clinic. Labs pending. PVCs? Referring to cards, anticipate holter placement for further eval and treatment. RTC precautions given.  - EKG 12-Lead - Ambulatory referral to Cardiology - TSH - CBC - Comprehensive metabolic panel  Return for after cards.    Rutherford Guys, MD Primary [facility] at [place], [place] [address] Ph.  [phone] Fax [phone]

## 2019-08-13 NOTE — Patient Instructions (Signed)
° ° ° °  If you have lab work done today you will be contacted with your lab results within the next 2 weeks.  If you have not heard from us then please contact us. The fastest way to get your results is to register for My Chart. ° ° °IF you received an x-ray today, you will receive an invoice from [facility]. Please contact [facility] at [phone] with questions or concerns regarding your invoice.  ° °IF you received labwork today, you will receive an invoice from [facility]. Please contact [facility] at [phone] with questions or concerns regarding your invoice.  ° °Our billing staff will not be able to assist you with questions regarding bills from these companies. ° °You will be contacted with the lab results as soon as they are available. The fastest way to get your results is to activate your My Chart account. Instructions are located on the last page of this paperwork. If you have not heard from us regarding the results in 2 weeks, please contact this office. °  ° ° ° °

## 2019-08-14 LAB — TSH: TSH: 1.11 u[IU]/mL (ref 0.450–4.500)

## 2019-08-14 LAB — COMPREHENSIVE METABOLIC PANEL
ALT: 11 IU/L (ref 0–32)
AST: 16 IU/L (ref 0–40)
Albumin/Globulin Ratio: 2 (ref 1.2–2.2)
Albumin: 4.5 g/dL (ref 3.8–4.8)
Alkaline Phosphatase: 45 IU/L (ref 39–117)
BUN/Creatinine Ratio: 15 (ref 9–23)
BUN: 10 mg/dL (ref 6–24)
Bilirubin Total: 0.3 mg/dL (ref 0.0–1.2)
CO2: 23 mmol/L (ref 20–29)
Calcium: 9.5 mg/dL (ref 8.7–10.2)
Chloride: 104 mmol/L (ref 96–106)
Creatinine, Ser: 0.68 mg/dL (ref 0.57–1.00)
GFR calc Af Amer: 123 mL/min/{1.73_m2} (ref >59)
GFR calc non Af Amer: 107 mL/min/{1.73_m2} (ref >59)
Globulin, Total: 2.3 g/dL (ref 1.5–4.5)
Glucose: 76 mg/dL (ref 65–99)
Potassium: 4 mmol/L (ref 3.5–5.2)
Sodium: 140 mmol/L (ref 134–144)
Total Protein: 6.8 g/dL (ref 6.0–8.5)

## 2019-08-14 LAB — CBC
Hematocrit: 40.5 % (ref 34.0–46.6)
Hemoglobin: 14.2 g/dL (ref 11.1–15.9)
MCH: 33 pg (ref 26.6–33.0)
MCHC: 35.1 g/dL (ref 31.5–35.7)
MCV: 94 fL (ref 79–97)
Platelets: 203 10*3/uL (ref 150–450)
RBC: 4.3 x10E6/uL (ref 3.77–5.28)
RDW: 11.8 % (ref 11.7–15.4)
WBC: 4.7 10*3/uL (ref 3.4–10.8)

## 2019-08-15 ENCOUNTER — Encounter: Payer: Self-pay | Admitting: Family Medicine

## 2019-09-05 ENCOUNTER — Ambulatory Visit (INDEPENDENT_AMBULATORY_CARE_PROVIDER_SITE_OTHER): Payer: No Typology Code available for payment source

## 2019-09-05 ENCOUNTER — Ambulatory Visit (INDEPENDENT_AMBULATORY_CARE_PROVIDER_SITE_OTHER): Payer: No Typology Code available for payment source | Admitting: Internal Medicine

## 2019-09-05 ENCOUNTER — Other Ambulatory Visit: Payer: Self-pay

## 2019-09-05 ENCOUNTER — Encounter: Payer: Self-pay | Admitting: Internal Medicine

## 2019-09-05 VITALS — BP 111/67 | HR 72 | Temp 97.1°F | Ht 65.0 in | Wt 160.0 lb

## 2019-09-05 DIAGNOSIS — R002 Palpitations: Secondary | ICD-10-CM

## 2019-09-05 NOTE — Progress Notes (Signed)
Cardiology Office Note   Date:  09/05/2019   ID:  Kayla Chaney, DOB April 04, 1976, MRN [id number]  PCP:  Rutherford Guys, MD  Cardiologist:   Dorris Carnes, MD    Patient referred by Dr Pamella Pert for palpitations     History of Present Illness: Kayla Chaney is a 44 y.o. female with a history of heart pounding   Followed by Dr Pamella Pert    PT has had  About 6 spells this week  Feels thump then it goes away   No dizziness  No SOB    Isolated   Drinks a coffee and 2 diet dr peppers per day   Monday went up stairs   Felt like heart was racing but  It was only 70 This occurred around 2 PM  Pt exercises   Is active at home   Works in [employer] well   Lipids have been high always  One family member has total chol of 300      Current Meds  Medication Sig  . methocarbamol (ROBAXIN) 500 MG tablet TAKE 1 TABLET BY MOUTH EVERY 8 HOURS AS NEEDED FOR MUSCLE SPASMS  . traZODone (DESYREL) 50 MG tablet TAKE 1 TABLET BY MOUTH AT BEDTIME  . valACYclovir (VALTREX) 1000 MG tablet Take 2 tablets (2,000 mg total) by mouth 2 (two) times daily. For 1 day per outbreak     Allergies:   Patient has no known allergies.   History reviewed. No pertinent past medical history.  Past Surgical History:  Procedure Laterality Date  . CESAREAN SECTION  2003, 2008     Social History:  The patient  reports that she has never smoked. She has never used smokeless tobacco. She reports that she does not drink alcohol or use drugs.   Family History:  The patient's family history includes Brain cancer in her sister; Diabetes in her father; Hypertension in her father; Stroke in her maternal grandmother and mother.  Mom had CVA at 31   Sister with very high cholesterol  ROS:  Please see the history of present illness. All other systems are reviewed and  Negative to the above problem except as noted.    PHYSICAL EXAM: VS:  BP 111/67   Pulse 72   Temp (!) 97.1 F (36.2 C)   Ht 5\' 5"  (1.651 m)   Wt 160 lb  (72.6 kg)   SpO2 100%   BMI 26.63 kg/m   GEN: Well nourished, well developed, in no acute distress  HEENT: normal  Neck: no JVD, carotid bruits, or masses Cardiac: RRR; no murmurs, rubs, or gallops,no edema  Respiratory:  clear to auscultation bilaterally, normal work of breathing GI: soft, nontender, nondistended, + BS  No hepatomegaly  MS: no deformity Moving all extremities   Skin: warm and dry, no rash Neuro:  Strength and sensation are intact Psych: euthymic mood, full affect   EKG:  EKG is not ordered today.  On 12/15  SR 73 bpm     Lipid Panel    Component Value Date/Time   CHOL 222 (H) 01/25/2019 1550   TRIG 117 01/25/2019 1550   HDL 49 01/25/2019 1550   CHOLHDL 4.5 (H) 01/25/2019 1550   LDLCALC 150 (H) 01/25/2019 1550      Wt Readings from Last 3 Encounters:  09/05/19 160 lb (72.6 kg)  08/13/19 162 lb (73.5 kg)  01/25/19 162 lb (73.5 kg)      ASSESSMENT AND PLAN:  1  Palpitaitons   Sound like isolated PACs or PVCs  Not too frequent  Not hemodynamically destabilizing     Pt's exam is normal Reviewed with her   Would recomm Zio patch to document    Stay active   Avoid / minimze excess caffeine   Stay hydrated No medication unless intensifies or becomres more bothersome  2   HL   LDL 150   No recent scan to eval arteries   For now would watch diet and fat intake given age   At some point could consider Calcium score CT to eval for atherosclerosis   F/U as needed or depending on test results    Current medicines are reviewed at length with the patient today.  The patient does not have concerns regarding medicines.  Signed, Dorris Carnes, MD  09/05/2019 8:44 AM    [facility] [place], [place], [place]  [address] Phone: [phone]; Fax: [phone]

## 2019-09-05 NOTE — Patient Instructions (Signed)
Medication Instructions:  Your physician recommends that you continue on your current medications as directed. Please refer to the Current Medication list given to you today.  *If you need a refill on your cardiac medications before your next appointment, please call your pharmacy*  Lab Work: NONE   If you have labs (blood work) drawn today and your tests are completely normal, you will receive your results only by: Marland Kitchen MyChart Message (if you have MyChart) OR . A paper copy in the mail If you have any lab test that is abnormal or we need to change your treatment, we will call you to review the results.  Testing/Procedures: Your physician has recommended that you wear an event monitor for 2 weeks. Event monitors are medical devices that record the heart's electrical activity. Doctors most often [place] these monitors to diagnose arrhythmias. Arrhythmias are problems with the speed or rhythm of the heartbeat. The monitor is a small, portable device. You can wear one while you do your normal daily activities. This is usually used to diagnose what is causing palpitations/syncope (passing out).    Follow-Up: At [facility], you and your health needs are our priority.  As part of our continuing mission to provide you with exceptional heart care, we have created designated Provider Care Teams.  These Care Teams include your primary Cardiologist (physician) and Advanced Practice Providers (APPs -  Physician Assistants and Nurse Practitioners) who all work together to provide you with the care you need, when you need it.  Your next appointment:    To be determined   The format for your next appointment:   Either In Person or Virtual  Provider:   Dorris Carnes, MD  Other Instructions  Thank you for choosing [facility]!

## 2019-09-07 MED FILL — METHOCARBAMOL 500 MG TABS: 500 | 30 days supply | Qty: 90 | Fill #1

## 2019-09-23 ENCOUNTER — Other Ambulatory Visit: Payer: Self-pay | Admitting: Family Medicine

## 2019-09-23 DIAGNOSIS — F5101 Primary insomnia: Secondary | ICD-10-CM

## 2019-09-23 MED FILL — traZODone HCL 50 MG TABS: 50 | 90 days supply | Qty: 90 | Fill #0

## 2019-09-23 NOTE — Telephone Encounter (Signed)
Requested Prescriptions  Pending Prescriptions Disp Refills  . traZODone (DESYREL) 50 MG tablet [Pharmacy Med Name: traZODone HCL 50 MG TABS 50 Tablet] 90 tablet 1    Sig: TAKE 1 TABLET BY MOUTH AT BEDTIME     Psychiatry: Antidepressants - Serotonin Modulator Passed - 09/23/2019  8:13 AM      Passed - Valid encounter within last 6 months    Recent Outpatient Visits          1 month ago Irregular heartbeat   [facility], Lilia Argue, MD   8 months ago Women's annual routine gynecological examination   [facility], Lilia Argue, MD   1 year ago Annual physical exam   [facility] Care at [place], PA-C   1 year ago Facial swelling   [facility] Care at [place], [place] D, PA-C   2 years ago Pain in joint involving right ankle and foot   [facility], Ranell Patrick, MD

## 2019-09-30 ENCOUNTER — Other Ambulatory Visit: Payer: Self-pay

## 2019-10-08 MED FILL — METHOCARBAMOL 500 MG TABS: 500 | 30 days supply | Qty: 90 | Fill #2

## 2019-10-10 ENCOUNTER — Telehealth: Payer: Self-pay | Admitting: *Deleted

## 2019-10-10 NOTE — Telephone Encounter (Signed)
Called patient with test results. No answer. Left message to call back.  

## 2019-10-10 NOTE — Telephone Encounter (Signed)
-----   Message from Fay Records, MD sent at 10/09/2019  8:00 PM EST ----- Left VM Monitor shows SR occasional ectopic atrial rhythm  Rates OK Rare PAC and PVC    ONe 6 beat nonsustained VT Symptoms associated with SR or ectopic atrial rhythm, rarely did she sense a PAC Would stay hydrated    I would not recomm any medical therapy unless becomes more symptomatic

## 2019-10-14 ENCOUNTER — Encounter: Payer: Self-pay | Admitting: Family Medicine

## 2019-10-14 MED ORDER — HYDROXYZINE HCL 25 MG PO TABS: 25.0000 mg | ORAL_TABLET | Freq: Every evening | ORAL | 0 refills | Status: DC | PRN

## 2019-10-14 MED FILL — hydrOXYzine HCL 25 MG TABS: 25 | 30 days supply | Qty: 60 | Fill #0

## 2019-11-01 ENCOUNTER — Ambulatory Visit (INDEPENDENT_AMBULATORY_CARE_PROVIDER_SITE_OTHER): Payer: No Typology Code available for payment source | Admitting: Family Medicine

## 2019-11-01 ENCOUNTER — Other Ambulatory Visit: Payer: Self-pay

## 2019-11-01 ENCOUNTER — Encounter: Payer: Self-pay | Admitting: Family Medicine

## 2019-11-01 VITALS — BP 115/73 | HR 62 | Temp 98.0°F | Ht 65.0 in | Wt 160.0 lb

## 2019-11-01 DIAGNOSIS — F5101 Primary insomnia: Secondary | ICD-10-CM | POA: Diagnosis not present

## 2019-11-01 DIAGNOSIS — R002 Palpitations: Secondary | ICD-10-CM

## 2019-11-01 MED ORDER — HYDROXYZINE HCL 10 MG PO TABS: 10.0000 mg | ORAL_TABLET | Freq: Every day | ORAL | 1 refills | Status: DC

## 2019-11-01 NOTE — Progress Notes (Signed)
3/5/20213:30 PM  Kayla Chaney [birth year], 44 y.o., female [id number]  Chief Complaint  Patient presents with  . Follow-up    post cardiology appt , pt reports feeling well   . vistaril    requests 10 mg - too fatigued during the day     HPI:   Patient is a 44 y.o. female who presents today for followup after cards for palpitations  Saw cards Jan 2021 - zio patch, occ ectopic atrial rhythm, rare PAC, PVCs, no treatment for now, push hydration LDL 150 - advised calcium score at age 9  She realized that most palpitations were happening after taking trazodone and caffeine, has stopped and palpitations have resolved, she has also cut back sign on caffeine  She has tried to get back on vistaril but 25mg  sedating She would like to try 10mg  a day    Depression screen [facility] 2/9 11/01/2019 08/13/2019 01/25/2019  Decreased Interest 0 0 0  Down, Depressed, Hopeless 0 0 0  PHQ - 2 Score 0 0 0    Fall Risk  11/01/2019 08/13/2019 01/25/2019 11/17/2017 10/30/2017  Falls in the past year? 0 0 0 No No  Number falls in past yr: 0 0 0 - -  Injury with Fall? 0 0 0 - -  Follow up Falls evaluation completed - - - -     No Known Allergies  Prior to Admission medications   Medication Sig Start Date End Date Taking? Authorizing Provider  hydrOXYzine (ATARAX/VISTARIL) 25 MG tablet Take 1-2 tablets (25-50 mg total) by mouth at bedtime as needed. 10/14/19  Yes Rutherford Guys, MD  methocarbamol (ROBAXIN) 500 MG tablet TAKE 1 TABLET BY MOUTH EVERY 8 HOURS AS NEEDED FOR MUSCLE SPASMS 08/06/19  Yes Rutherford Guys, MD  valACYclovir (VALTREX) 1000 MG tablet Take 2 tablets (2,000 mg total) by mouth 2 (two) times daily. For 1 day per outbreak 05/23/19  Yes Rutherford Guys, MD    No past medical history on file.  Past Surgical History:  Procedure Laterality Date  . CESAREAN SECTION  2003, 2008    Social History   Tobacco Use  . Smoking status: Never Smoker  . Smokeless tobacco: Never Used    Substance Use Topics  . Alcohol use: No    Family History  Problem Relation Age of Onset  . Stroke Mother   . Hypertension Father   . Diabetes Father   . Brain cancer Sister   . Stroke Maternal Grandmother     ROS Per hpi  OBJECTIVE:  Today's Vitals   11/01/19 1455  BP: 115/73  Pulse: 62  Temp: 98 F (36.7 C)  TempSrc: Temporal  SpO2: 100%  Weight: 160 lb (72.6 kg)  Height: 5\' 5"  (1.651 m)   Body mass index is 26.63 kg/m.   Physical Exam Vitals and nursing note reviewed.  Constitutional:      Appearance: She is well-developed.  HENT:     Head: Normocephalic and atraumatic.  Eyes:     General: No scleral icterus.    Conjunctiva/sclera: Conjunctivae normal.     Pupils: Pupils are equal, round, and reactive to light.  Pulmonary:     Effort: Pulmonary effort is normal.  Musculoskeletal:     Cervical back: Neck supple.  Skin:    General: Skin is warm and dry.  Neurological:     Mental Status: She is alert and oriented to person, place, and time.     No results found for this  or any previous visit (from the past 24 hour(s)).  No results found.   ASSESSMENT and PLAN  1. Palpitations Benign, s/p cards eval  2. Primary insomnia Trial of low dose vistaril  Other orders - hydrOXYzine (ATARAX/VISTARIL) 10 MG tablet; Take 1 tablet (10 mg total) by mouth at bedtime.  Return in about 1 year (around 10/31/2020).    Rutherford Guys, MD [facility] at [place], [place] [address] Ph.  [phone] Fax [phone]

## 2019-11-01 NOTE — Patient Instructions (Signed)
° ° ° °  If you have lab work done today you will be contacted with your lab results within the next 2 weeks.  If you have not heard from us then please contact us. The fastest way to get your results is to register for My Chart. ° ° °IF you received an x-ray today, you will receive an invoice from [facility]. Please contact [facility] at [phone] with questions or concerns regarding your invoice.  ° °IF you received labwork today, you will receive an invoice from [facility]. Please contact [facility] at [phone] with questions or concerns regarding your invoice.  ° °Our billing staff will not be able to assist you with questions regarding bills from these companies. ° °You will be contacted with the lab results as soon as they are available. The fastest way to get your results is to activate your My Chart account. Instructions are located on the last page of this paperwork. If you have not heard from us regarding the results in 2 weeks, please contact this office. °  ° ° ° °

## 2019-11-06 MED FILL — METHOCARBAMOL 500 MG TABS: 500 | 30 days supply | Qty: 90 | Fill #3

## 2019-11-12 MED FILL — hydrOXYzine HCL 10 MG TABS: 10 | 90 days supply | Qty: 90 | Fill #0

## 2019-12-03 MED FILL — METHOCARBAMOL 500 MG TABS: 500 | 30 days supply | Qty: 90 | Fill #4

## 2020-02-04 ENCOUNTER — Other Ambulatory Visit (HOSPITAL_COMMUNITY): Payer: Self-pay | Admitting: Family Medicine

## 2020-02-04 DIAGNOSIS — Z1231 Encounter for screening mammogram for malignant neoplasm of breast: Secondary | ICD-10-CM

## 2020-02-08 ENCOUNTER — Other Ambulatory Visit: Payer: Self-pay | Admitting: Family Medicine

## 2020-02-08 DIAGNOSIS — M5030 Other cervical disc degeneration, unspecified cervical region: Secondary | ICD-10-CM

## 2020-02-08 NOTE — Telephone Encounter (Signed)
Requested medication (s) are due for refill today: yes  Requested medication (s) are on the active medication list: yes  Last refill:  08/06/19  Future visit scheduled: no  Notes to clinic:  medication not delegated to NT to RF   Requested Prescriptions  Pending Prescriptions Disp Refills   methocarbamol (ROBAXIN) 500 MG tablet [Pharmacy Med Name: METHOCARBAMOL 500 MG TABS 500 Tablet] 90 tablet 5    Sig: TAKE 1 TABLET BY MOUTH EVERY 8 HOURS AS NEEDED FOR MUSCLE SPASMS      Not Delegated - Analgesics:  Muscle Relaxants Failed - 02/08/2020  8:18 AM      Failed - This refill cannot be delegated      Passed - Valid encounter within last 6 months    Recent Outpatient Visits           3 months ago Palpitations   [facility], Lilia Argue, MD   5 months ago Irregular heartbeat   [facility], Lilia Argue, MD   1 year ago Women's annual routine gynecological examination   [facility], Lilia Argue, MD   2 years ago Annual physical exam   [facility] at [place], PA-C   2 years ago Facial swelling   Primary [facility] at [facility], [place], [place]

## 2020-02-10 MED FILL — METHOCARBAMOL 500 MG TABS: 500 | 30 days supply | Qty: 90 | Fill #0

## 2020-02-12 MED FILL — hydrOXYzine HCL 10 MG TABS: 10 | 90 days supply | Qty: 90 | Fill #1

## 2020-02-14 ENCOUNTER — Other Ambulatory Visit: Payer: Self-pay

## 2020-02-14 ENCOUNTER — Ambulatory Visit (HOSPITAL_COMMUNITY)
Admission: RE | Admit: 2020-02-14 | Discharge: 2020-02-14 | Disposition: A | Discharge status: Home / Self Care | Payer: No Typology Code available for payment source | Source: Ambulatory Visit | Attending: Family Medicine | Admitting: Family Medicine

## 2020-02-14 DIAGNOSIS — Z1231 Encounter for screening mammogram for malignant neoplasm of breast: Secondary | ICD-10-CM | POA: Diagnosis present

## 2020-03-12 MED FILL — METHOCARBAMOL 500 MG TABS: 500 | 30 days supply | Qty: 90 | Fill #1

## 2020-04-13 ENCOUNTER — Other Ambulatory Visit: Payer: Self-pay | Admitting: Family Medicine

## 2020-04-13 DIAGNOSIS — M5030 Other cervical disc degeneration, unspecified cervical region: Secondary | ICD-10-CM

## 2020-04-13 NOTE — Telephone Encounter (Signed)
Requested medication (s) are due for refill today:yes  Requested medication (s) are on the active medication list: yes  Last refill: 02/10/20  #90  1 refill  Future visit scheduled no  Notes to clinic: Not delegated  Requested Prescriptions  Pending Prescriptions Disp Refills   methocarbamol (ROBAXIN) 500 MG tablet [Pharmacy Med Name: METHOCARBAMOL 500 MG TABS 500 Tablet] 90 tablet 1    Sig: TAKE 1 TABLET BY MOUTH EVERY 8 HOURS AS NEEDED FOR MUSCLE SPASMS      Not Delegated - Analgesics:  Muscle Relaxants Failed - 04/13/2020  3:47 PM      Failed - This refill cannot be delegated      Passed - Valid encounter within last 6 months    Recent Outpatient Visits           5 months ago Palpitations   [facility], Lilia Argue, MD   8 months ago Irregular heartbeat   [facility], Lilia Argue, MD   1 year ago Women's annual routine gynecological examination   [facility], Lilia Argue, MD   2 years ago Annual physical exam   [facility] at [place], PA-C   2 years ago Facial swelling   Primary Care at [facility], [place], [place]

## 2020-04-14 MED FILL — METHOCARBAMOL 500 MG TABS: 500 | 30 days supply | Qty: 90 | Fill #0

## 2020-04-23 ENCOUNTER — Encounter: Payer: Self-pay | Admitting: Family Medicine

## 2020-04-24 NOTE — Telephone Encounter (Signed)
Pt asking for advice to get rid of a headache that's lasted 3 days on the Rt side alone some relief from tylenol and aleve but upset stomach as result. Pt asking for advice to get through the weekend. What should I advise?

## 2020-05-12 ENCOUNTER — Other Ambulatory Visit: Payer: Self-pay | Admitting: Family Medicine

## 2020-05-12 MED FILL — hydrOXYzine HCL 10 MG TABS: 10 | 90 days supply | Qty: 90 | Fill #0

## 2020-05-12 MED FILL — valACYclovir HCL 1 GM TABS: 1 | 7 days supply | Qty: 28 | Fill #2

## 2020-05-12 MED FILL — METHOCARBAMOL 500 MG TABS: 500 | 30 days supply | Qty: 90 | Fill #1

## 2020-06-09 ENCOUNTER — Encounter: Payer: Self-pay | Admitting: Family Medicine

## 2020-06-09 ENCOUNTER — Other Ambulatory Visit: Payer: Self-pay

## 2020-06-09 DIAGNOSIS — M5030 Other cervical disc degeneration, unspecified cervical region: Secondary | ICD-10-CM

## 2020-06-09 MED ORDER — METHOCARBAMOL 500 MG PO TABS: ORAL_TABLET | ORAL | 1 refills | Status: DC

## 2020-06-09 MED FILL — METHOCARBAMOL 500 MG TABS: 500 | 30 days supply | Qty: 90 | Fill #0

## 2020-07-03 ENCOUNTER — Other Ambulatory Visit: Payer: Self-pay | Admitting: Family Medicine

## 2020-07-03 ENCOUNTER — Other Ambulatory Visit: Payer: Self-pay

## 2020-07-03 ENCOUNTER — Encounter: Payer: Self-pay | Admitting: Family Medicine

## 2020-07-03 ENCOUNTER — Ambulatory Visit (INDEPENDENT_AMBULATORY_CARE_PROVIDER_SITE_OTHER): Payer: No Typology Code available for payment source | Admitting: Family Medicine

## 2020-07-03 VITALS — BP 130/70 | HR 64 | Temp 97.7°F | Ht 65.0 in | Wt 164.4 lb

## 2020-07-03 DIAGNOSIS — G479 Sleep disorder, unspecified: Secondary | ICD-10-CM

## 2020-07-03 DIAGNOSIS — Z13228 Encounter for screening for other metabolic disorders: Secondary | ICD-10-CM | POA: Diagnosis not present

## 2020-07-03 DIAGNOSIS — Z0001 Encounter for general adult medical examination with abnormal findings: Secondary | ICD-10-CM | POA: Diagnosis not present

## 2020-07-03 DIAGNOSIS — Z1211 Encounter for screening for malignant neoplasm of colon: Secondary | ICD-10-CM

## 2020-07-03 DIAGNOSIS — M5030 Other cervical disc degeneration, unspecified cervical region: Secondary | ICD-10-CM | POA: Diagnosis not present

## 2020-07-03 DIAGNOSIS — Z0000 Encounter for general adult medical examination without abnormal findings: Secondary | ICD-10-CM

## 2020-07-03 DIAGNOSIS — E785 Hyperlipidemia, unspecified: Secondary | ICD-10-CM | POA: Diagnosis not present

## 2020-07-03 DIAGNOSIS — N951 Menopausal and female climacteric states: Secondary | ICD-10-CM

## 2020-07-03 MED ORDER — METHOCARBAMOL 500 MG PO TABS: ORAL_TABLET | ORAL | 3 refills | Status: DC

## 2020-07-03 MED ORDER — HYDROXYZINE HCL 10 MG PO TABS: ORAL_TABLET | ORAL | 3 refills | Status: DC

## 2020-07-03 MED FILL — METHOCARBAMOL 500 MG TABS: 500 | 30 days supply | Qty: 90 | Fill #0

## 2020-07-03 NOTE — Patient Instructions (Addendum)
Referral has been made for screening colonoscopy  Referral has been made to GYN for perimenopausal symptoms  Refill has been made for methocarbamol and hydroxyzine  You appear to be very normal and healthy on exam, and I will let you know the results of your remaining labs that are pending.  Return as needed.  Continue to seek to be physically, emotionally, relationally, and spiritually healthy.    If you have lab work done today you will be contacted with your lab results within the next 2 weeks.  If you have not heard from [place] then please contact us. The fastest way to get your results is to register for My Chart.   IF you received an x-ray today, you will receive an invoice from [facility]. Please contact [facility] at [phone] with questions or concerns regarding your invoice.   IF you received labwork today, you will receive an invoice from [facility]. Please contact [facility] at [phone] with questions or concerns regarding your invoice.   Our billing staff will not be able to assist you with questions regarding bills from these companies.  You will be contacted with the lab results as soon as they are available. The fastest way to get your results is to activate your My Chart account. Instructions are located on the last page of this paperwork. If you have not heard from [place] regarding the results in 2 weeks, please contact this office.

## 2020-07-03 NOTE — Progress Notes (Signed)
Patient ID: Kayla Chaney, female    DOB: June 27, 1976  Age: 44 y.o. MRN: [id number]  Chief Complaint  Patient presents with  . Annual Exam    here for Annual exam only. Patient would like to discuss increasing the vistaril     Subjective:   Kayla Chaney is here for an annual physical examination.  She is doing well.  She needs a couple of medicines refilled.  She needs to discuss some screening studies.  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  Past medical history: Operations: C-section x2 Medical illnesses: Palpitations that have been followed by Dr. Dorris Carnes, cardiologist Medications: Vistaril at bedtime  Methocarbamol as needed cervical muscle spasms  Valtrex as needed Occasion allergies: None Gravida 2 para 2  Social history: Happily married.  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  Active in their church, [employer], near [place].  Do a lot of things together as a family.  2 daughters at home.  Exercises regularly.  Does not drink, smoke, or use substances.  Family history: Mother had some dependency issues  Review of systems: Constitutional: Unremarkable HEENT: Unremarkable Respiratory: Unremarkable Cardiovascular: About once a week palpitations as noted above GI: Unremarkable GU: Unremarkable Musculoskeletal: Unremarkable Neurologic: Unremarkable Psychiatric: Unremarkable Endocrine: Hot flashes.  Has an IUD, not quite sure on her menstrual status.  Current allergies, medications, problem list, past/family and social histories reviewed.  Objective:  BP 130/70 (BP Location: Right Arm, Patient Position: Sitting, Cuff Size: Normal)   Pulse 64   Temp 97.7 F (36.5 C) (Temporal)   Ht 5\' 5"  (1.651 m)   Wt 164 lb 6.4 oz (74.6 kg)   SpO2 98%   BMI 27.36 kg/m  Well-developed well-nourished lady in no acute distress.  TMs normal.  Eyes PERRL.  Throat clear.  Neck supple without nodes or thyromegaly.  No carotid bruits.  Chest is  clear to auscultation.  Heart rate without murmurs.  M soft without mass or tenderness.  Pelvic and breast not examined at this time.  Extremities unremarkable.  Skin unremarkable.  Assessment & Plan:   Assessment: 1. Annual physical exam   2. Encounter for screening for metabolic disorder   3. Hyperlipidemia, unspecified hyperlipidemia type   4. DDD (degenerative disc disease), cervical   5. Sleep disturbance   6. Perimenopausal   7. Special screening for malignant neoplasms, colon       Plan: See instructions.  Patient has my chart and says she can check her labs there. Orders Placed This Encounter  Procedures  . Comprehensive metabolic panel  . Lipid Panel  . Ambulatory referral to Gynecology    Referral Priority:   Routine    Referral Type:   Consultation    Referral Reason:   Specialty Services Required    Requested Specialty:   Gynecology    Number of Visits Requested:   1  . Ambulatory referral to Gastroenterology    Referral Priority:   Routine    Referral Type:   Consultation    Referral Reason:   Specialty Services Required    Number of Visits Requested:   1    Meds ordered this encounter  Medications  . hydrOXYzine (ATARAX/VISTARIL) 10 MG tablet    Sig: Take 1 or 2 as needed at bedtime.    Dispense:  180 tablet    Refill:  3  . methocarbamol (ROBAXIN) 500 MG tablet    Sig: TAKE 1 TABLET BY MOUTH EVERY 8  HOURS AS NEEDED FOR MUSCLE SPASMS    Dispense:  90 tablet    Refill:  3         Patient Instructions   Referral has been made for screening colonoscopy  Referral has been made to GYN for perimenopausal symptoms  Refill has been made for methocarbamol and hydroxyzine  You appear to be very normal and healthy on exam, and I will let you know the results of your remaining labs that are pending.  Return as needed.  Continue to seek to be physically, emotionally, relationally, and spiritually healthy.    If you have lab work done today you will  be contacted with your lab results within the next 2 weeks.  If you have not heard from [place] then please contact us. The fastest way to get your results is to register for My Chart.   IF you received an x-ray today, you will receive an invoice from [facility]. Please contact [facility] at [phone] with questions or concerns regarding your invoice.   IF you received labwork today, you will receive an invoice from [facility]. Please contact [facility] at [phone] with questions or concerns regarding your invoice.   Our billing staff will not be able to assist you with questions regarding bills from these companies.  You will be contacted with the lab results as soon as they are available. The fastest way to get your results is to activate your My Chart account. Instructions are located on the last page of this paperwork. If you have not heard from [place] regarding the results in 2 weeks, please contact this office.         Return in about 1 year (around 07/03/2021).   Ruben Reason, MD 07/03/2020

## 2020-07-04 LAB — COMPREHENSIVE METABOLIC PANEL
ALT: 21 IU/L (ref 0–32)
AST: 18 IU/L (ref 0–40)
Albumin/Globulin Ratio: 1.8 (ref 1.2–2.2)
Albumin: 4.4 g/dL (ref 3.8–4.8)
Alkaline Phosphatase: 50 IU/L (ref 44–121)
BUN/Creatinine Ratio: 11 (ref 9–23)
BUN: 8 mg/dL (ref 6–24)
Bilirubin Total: 0.4 mg/dL (ref 0.0–1.2)
CO2: 22 mmol/L (ref 20–29)
Calcium: 9.5 mg/dL (ref 8.7–10.2)
Chloride: 104 mmol/L (ref 96–106)
Creatinine, Ser: 0.72 mg/dL (ref 0.57–1.00)
GFR calc Af Amer: 117 mL/min/{1.73_m2} (ref >59)
GFR calc non Af Amer: 101 mL/min/{1.73_m2} (ref >59)
Globulin, Total: 2.5 g/dL (ref 1.5–4.5)
Glucose: 82 mg/dL (ref 65–99)
Potassium: 4 mmol/L (ref 3.5–5.2)
Sodium: 140 mmol/L (ref 134–144)
Total Protein: 6.9 g/dL (ref 6.0–8.5)

## 2020-07-04 LAB — LIPID PANEL
Chol/HDL Ratio: 5.7 ratio — ABNORMAL HIGH (ref 0.0–4.4)
Cholesterol, Total: 243 mg/dL — ABNORMAL HIGH (ref 100–199)
HDL: 43 mg/dL (ref >39)
LDL Chol Calc (NIH): 179 mg/dL — ABNORMAL HIGH (ref 0–99)
Triglycerides: 114 mg/dL (ref 0–149)
VLDL Cholesterol Cal: 21 mg/dL (ref 5–40)

## 2020-07-05 ENCOUNTER — Other Ambulatory Visit: Payer: Self-pay | Admitting: Family Medicine

## 2020-07-05 DIAGNOSIS — E7800 Pure hypercholesterolemia, unspecified: Secondary | ICD-10-CM

## 2020-07-05 MED ORDER — ROSUVASTATIN CALCIUM 5 MG PO TABS: 5.0000 mg | ORAL_TABLET | Freq: Every day | ORAL | 3 refills | Status: DC

## 2020-07-05 NOTE — Progress Notes (Signed)
Call:  Labs excellent except for cholesterol.  It was a little high in the past, is worse this time.    I encourage you to continue exercise regularly and to try to lose a few pounds..  Avoid excessive meat fats.   If you are willing, begin on a low dose of crestor which I have prescribed.  Plan to get labs rechecked in 4-6 months.  Ruben Reason MD

## 2020-07-06 MED FILL — ROSUVASTATIN CALCIUM 5 MG T: 5 | 90 days supply | Qty: 90 | Fill #0

## 2020-07-13 ENCOUNTER — Encounter: Payer: Self-pay | Admitting: Internal Medicine

## 2020-08-04 MED FILL — hydrOXYzine HCL 10 MG TABS: 10 | 90 days supply | Qty: 180 | Fill #0

## 2020-08-05 ENCOUNTER — Encounter: Payer: Self-pay | Admitting: Family Medicine

## 2020-08-07 MED FILL — METHOCARBAMOL 500 MG TABS: 500 | 30 days supply | Qty: 90 | Fill #1

## 2020-08-18 ENCOUNTER — Encounter: Payer: Self-pay | Admitting: Family Medicine

## 2020-08-26 ENCOUNTER — Ambulatory Visit: Payer: No Typology Code available for payment source

## 2020-08-29 DIAGNOSIS — U071 COVID-19: Secondary | ICD-10-CM

## 2020-08-29 HISTORY — DX: COVID-19: U07.1

## 2020-09-03 MED FILL — METHOCARBAMOL 500 MG TABS: 500 | 30 days supply | Qty: 90 | Fill #2

## 2020-09-04 ENCOUNTER — Other Ambulatory Visit: Payer: Self-pay

## 2020-09-04 ENCOUNTER — Other Ambulatory Visit: Payer: Self-pay | Admitting: Obstetrics and Gynecology

## 2020-09-04 ENCOUNTER — Ambulatory Visit: Payer: 59 | Admitting: Obstetrics and Gynecology

## 2020-09-04 ENCOUNTER — Encounter: Payer: Self-pay | Admitting: Obstetrics and Gynecology

## 2020-09-04 VITALS — BP 126/80 | Ht 65.0 in | Wt 168.0 lb

## 2020-09-04 DIAGNOSIS — Z79890 Hormone replacement therapy (postmenopausal): Secondary | ICD-10-CM | POA: Diagnosis not present

## 2020-09-04 DIAGNOSIS — N951 Menopausal and female climacteric states: Secondary | ICD-10-CM

## 2020-09-04 MED ORDER — ESTRADIOL 0.5 MG PO TABS: 0.5000 mg | ORAL_TABLET | Freq: Every day | ORAL | 11 refills | Status: DC

## 2020-09-04 MED FILL — ESTRADIOL 0.5 MG TABS: 0.5 | 30 days supply | Qty: 30 | Fill #0

## 2020-09-04 NOTE — Progress Notes (Signed)
Kayla Chaney 1976-05-03 [id number]  SUBJECTIVE:  45 y.o. G2P2 female presents for question of menopausal symptoms that have developed and worse in the last several months.  Her symptoms have included hot flashes, low sex drive, irritation with sex, a little more emotional and 'snappier' than normal.  Also having trouble sleeping, often this is from waking up from hot flashes and happens about 2 AM most nights.  Has tried black cohosh preparations and this was not helpful.  She has a Mirena IUD in place and is amenorrheic.  This is probably approaching about 5 years after insertion per her recollection.  Other stressors include her father having passed away from COVID-42.  She had been on Zoloft for postpartum depression in the past, she feels that this severely negatively affected libido and generally did not feel when it, describing her emotion as "flat."  Normal TSH noted 07/2019.  Works in [employer].  Physically active 5 days/week, does Zumba regularly 2 to 3 days/week.  Non-smoker.  No previous history of blood clots.  No migraine headaches.    Current Outpatient Medications  Medication Sig Dispense Refill  . hydrOXYzine (ATARAX/VISTARIL) 10 MG tablet Take 1 or 2 as needed at bedtime. 180 tablet 3  . levonorgestrel (MIRENA) 20 MCG/24HR IUD 1 each by Intrauterine route once.    . methocarbamol (ROBAXIN) 500 MG tablet TAKE 1 TABLET BY MOUTH EVERY 8 HOURS AS NEEDED FOR MUSCLE SPASMS 90 tablet 3  . valACYclovir (VALTREX) 1000 MG tablet Take 2 tablets (2,000 mg total) by mouth 2 (two) times daily. For 1 day per outbreak 30 tablet 2   No current facility-administered medications for this visit.   Allergies: Patient has no known allergies.  No LMP recorded. (Menstrual status: IUD).  Past medical history,surgical history, problem list, medications, allergies, family history and social history were all reviewed and documented as reviewed in the EPIC chart.  ROS: Positives and negatives  as reviewed in HPI    OBJECTIVE:  BP 126/80 (BP Location: Right Arm, Patient Position: Sitting, Cuff Size: Normal)   Ht 5\' 5"  (1.651 m)   Wt 168 lb (76.2 kg)   BMI 27.96 kg/m  The patient appears well, alert, oriented, in no distress. PELVIC EXAM: Deferred due to constant nature of visit   ASSESSMENT:  45 y.o. G2P2 here for discussion of HRT for menopausal symptom relief  PLAN:  We discussed use of other OTC remedies including soy.  She was not interested in trying SSRI/SNRI due to her previous experience on these kinds of medication and how they made her feel.  We discussed HRT.  Side effects of HRT discussed including the importance of taking both estrogen and progesterone for protection from endometrial cancer in women that have a uterus.  Mirena IUD is considered okay for off label endometrial protection, I reminded her that it is important to replace the device when due for removal and if she chooses not to do the IUD again then she would need to start on an oral progestin. WHI study results reviewed and the risks of taking HRT to include thrombotic disease such as heart attack, stroke, DVT, PE are reviewed, in addition to slightly increased risks of breast cancer in the combined HRT arm.  Not clear if that risk also applies to oral estrogen plus IUD progestin exposure.  Review of her medical history indictates that she does not have any medical contraindication to use of estrogen.  She maintains a physically active lifestyle so this  should help in reducing thrombotic risks.  Potential side effects are discussed.  We discussed lowest effective dose for shortest duration of time possible or the goals of management.  Stopping estrogen use within 10 years of starting is generally the recommendation.  She understands the risks and would like to start on low dose so we will start at 0.5 mg estradiol daily and will continue the Mirena IUD for endometrial protection.  We discussed titrating the dose  as needed, recommend using for at least 1 or 2 months of the beginning dose to see if she gleans benefit from that first. The patient is aware that I will only be at this practice until early March 2022 and she knows to make sure she seeks gynecologic follow-up annually especially if on HRT.     Estradiol 0.5 mg daily   Joseph Pierini MD 09/04/20

## 2020-09-08 ENCOUNTER — Other Ambulatory Visit: Payer: Self-pay

## 2020-09-08 ENCOUNTER — Other Ambulatory Visit: Payer: Self-pay | Admitting: Gastroenterology

## 2020-09-08 ENCOUNTER — Ambulatory Visit (INDEPENDENT_AMBULATORY_CARE_PROVIDER_SITE_OTHER): Payer: Self-pay | Admitting: *Deleted

## 2020-09-08 VITALS — Ht 65.0 in | Wt 168.0 lb

## 2020-09-08 DIAGNOSIS — Z1211 Encounter for screening for malignant neoplasm of colon: Secondary | ICD-10-CM

## 2020-09-08 MED ORDER — CLENPIQ 10-3.5-12 MG-GM -GM/160ML PO SOLN: 1.0000 | Freq: Once | ORAL | 0 refills | Status: DC

## 2020-09-08 MED FILL — CLENPIQ 10-3.5-12 MG-GM -GM: 10-3.5-12 M | 1 days supply | Qty: 320 | Fill #0

## 2020-09-08 NOTE — Progress Notes (Signed)
Ok to schedule on our end. ASA II. However, she will need to check with her insurance company to verify that they will cover a screening colonoscopy at age 45.

## 2020-09-08 NOTE — Patient Instructions (Addendum)
[facility] INSTRUCTIONS   Patient Name:  Kayla Chaney Date of procedure:  11/20/2020 Time to register at [facility] Stay: You will receive a call from the hospital a few days before your procedure. Provider:  Dr. Abbey Chatters   Please notify us immediately if you are diabetic, take iron supplements, or if you are on coumadin or any blood thinners.  Please hold the following medications: n/a  Note: Do NOT refrigerate or freeze CLENPIQ. CLENPIQ is ready to drink. There is no need to add any other liquid or mix the medicine in the bottle before you start dosing.   11/19/2020- 1 Day prior to procedure:     CLEAR LIQUIDS ALL DAY--NO SOLID FOODS OR DAIRY PRODUCTS! See list of liquids that are allowed and items that are NOT allowed below.    Diabetic Medication Instructions:  n/a   You must drink plenty of CLEAR LIQUIDS starting before your bowel prep. It is important to stay adequately hydrated before, during, and after your bowel prep for the prep to work effectively!   At 7:00 AM Begin the prep as follows:    1. Drink one bottle of premixed CLENPIQ right from the bottle. 2. Drink at least five (5) 8-ounce drinks of clear liquids of your choice within the next 5 hours   Continue clear liquids.       At 5:00 PM Begin the prep as follows:    3. Drink one bottle of premixed CLENPIQ right from the bottle. 4. Drink at least five (5) 8-ounce drinks of clear liquids of your choice within the next 5 hours   Continue clear liquids until midnight.  Nothing by mouth after midnight.  11/20/2020-  Day of Procedure   Diabetic Medication Instructions: n/a   You may take TYLENOL products. Please continue your regular medications unless we have instructed you otherwise.   Please note, on the day of your procedure you MUST be accompanied by an adult who is willing to assume responsibility for you at time of discharge. If you do not have such person with you, your procedure will  have to be rescheduled.                                                                                                                     Please leave ALL jewelry at home prior to coming to the hospital for your procedure.   *It is your responsibility to check with your insurance company for the benefits of coverage you have for this procedure. Unfortunately, not all insurance companies have benefits to cover all or part of these types of procedures. It is your responsibility to check your benefits, however we will be glad to assist you with any codes your insurance company may need.   Please note that most insurance companies will not cover a screening colonoscopy for people under the age of 26  For example, with some insurance companies you may have benefits for a screening colonoscopy, but if polyps  are found the diagnosis will change and then you may have a deductible that will need to be met. Please make sure you check your benefits for screening colonoscopy as well as a diagnostic colonoscopy.   CLEAR LIQUIDS: (NO RED or PURPLE) Water  Jello   Apple Juice  White Grape Juice   Kool-Aid Soft drinks  Banana popsicles Sports Drink  Black coffee (No cream or milk) Tea (No cream or milk)  Broth (fat free beef/chicken/vegetable)  Clear liquids allow you to see your fingers on the other side of the glass.  Be sure they are NOT RED or PURPLE in color, cloudy, but CLEAR.  Do Not Eat: Dairy products of any kind Cranberry juice Tomato or V8 Juice  Orange Juice   Grapefruit Juice Red Grape Juice Alcohol   Non-dairy creamer Solid foods like cereal, oatmeal, yogurt, fruits, vegetables, creamed soups, eggs, bread, etc   HELPFUL HINTS TO MAKE DRINKING EASIER: -Trying drinking through a straw. -If you become nauseated, try consuming smaller amounts or stretch out the time between glasses.  Stop for 30 minutes & slowly start back drinking.  Call our office with any questions or concerns at  [phone].  Thank You,  Christ Kick, [place]

## 2020-09-08 NOTE — Progress Notes (Addendum)
Gastroenterology Pre-Procedure Review  Request Date: 09/08/2020 Requesting Physician: Dr. Ruben Reason @ [facility] @ [place], no previous TCS  PATIENT REVIEW QUESTIONS: The patient responded to the following health history questions as indicated:    1. Diabetes Melitis: no  2. Joint replacements in the past 12 months: no 3. Major health problems in the past 3 months: no 4. Has an artificial valve or MVP: no 5. Has a defibrillator: no 6. Has been advised in past to take antibiotics in advance of a procedure like teeth cleaning: no 7. Family history of colon cancer: no  8. Alcohol Use: no 9. Illicit drug Use: no 10. History of sleep apnea: no  11. History of coronary artery or other vascular stents placed within the last 12 months: no 12. History of any prior anesthesia complications: yes, n/v after D&C 13. Body mass index is 27.96 kg/m.    MEDICATIONS & ALLERGIES:    Patient reports the following regarding taking any blood thinners:   Plavix? no Aspirin? no Coumadin? no Brilinta? no Xarelto? no Eliquis? no Pradaxa? no Savaysa? no Effient? no  Patient confirms/reports the following medications:  Current Outpatient Medications  Medication Sig Dispense Refill  . cholecalciferol (VITAMIN D3) 25 MCG (1000 UNIT) tablet Take 1,000 Units by mouth daily.    . hydrOXYzine (ATARAX/VISTARIL) 10 MG tablet Take 1 or 2 as needed at bedtime. 180 tablet 3  . levonorgestrel (MIRENA) 20 MCG/24HR IUD 1 each by Intrauterine route once.    . methocarbamol (ROBAXIN) 500 MG tablet TAKE 1 TABLET BY MOUTH EVERY 8 HOURS AS NEEDED FOR MUSCLE SPASMS (Patient taking differently: daily. TAKE 1 TABLET BY MOUTH EVERY 8 HOURS AS NEEDED FOR MUSCLE SPASMS) 90 tablet 3  . valACYclovir (VALTREX) 1000 MG tablet Take 2 tablets (2,000 mg total) by mouth 2 (two) times daily. For 1 day per outbreak (Patient taking differently: Take 2,000 mg by mouth as needed. For 1 day per outbreak) 30 tablet 2  . estradiol  (ESTRACE) 0.5 MG tablet Take 1 tablet (0.5 mg total) by mouth daily. (Patient not taking: Reported on 09/08/2020) 30 tablet 11   No current facility-administered medications for this visit.    Patient confirms/reports the following allergies:  No Known Allergies  No orders of the defined types were placed in this encounter.   AUTHORIZATION INFORMATION Primary Insurance: [employer] [employer] [place] ,  [place] #: [id number],  Group #: [id number] Pre-Cert / Josem Kaufmann required: No, not required Pre-Cert / Auth #: Ref#: [id number]  SCHEDULE INFORMATION: Procedure has been scheduled as follows:  Date: 09/25/2020, Time: 12:45 Location: [facility] with Dr. Abbey Chatters  This Gastroenterology Pre-Precedure Review Form is being routed to the following provider(s): Aliene Altes, PA

## 2020-09-10 ENCOUNTER — Encounter: Payer: Self-pay | Admitting: Obstetrics and Gynecology

## 2020-09-11 ENCOUNTER — Other Ambulatory Visit: Payer: Self-pay | Admitting: *Deleted

## 2020-09-11 NOTE — Progress Notes (Signed)
Called [facility] and spoke to [place].  She informed me that no prior Kayla Chaney is required for procedure.  She informed me that there is no age limit requirements for procedure.  Ref#: [id number]

## 2020-09-21 ENCOUNTER — Telehealth: Payer: Self-pay | Admitting: Internal Medicine

## 2020-09-21 NOTE — Telephone Encounter (Signed)
Lmom for pt to call back. 

## 2020-09-21 NOTE — Telephone Encounter (Signed)
Pt needs to reschedule her procedure with Dr Abbey Chatters on Friday. She is positive for covid. [phone]

## 2020-09-22 NOTE — Telephone Encounter (Addendum)
Spoke with pt.  She requested for me to call her back once March schedules are available.  Called [name] to inform her.  Pt should not need Covid screening when I reschedule her as long as it is within 90 days of last result. She said [facility] did her covid test on 09/18/2020.  Will request a copy of it if it hasn't showed up in Epic by March procedure.

## 2020-09-23 ENCOUNTER — Other Ambulatory Visit (HOSPITAL_COMMUNITY)
Admission: RE | Admit: 2020-09-23 | Discharge: 2020-09-23 | Disposition: A | Discharge status: Home / Self Care | Payer: 59 | Source: Ambulatory Visit | Attending: Internal Medicine | Admitting: Internal Medicine

## 2020-09-28 NOTE — Telephone Encounter (Signed)
Lmom for pt to call me back. 

## 2020-09-30 ENCOUNTER — Encounter: Payer: Self-pay | Admitting: *Deleted

## 2020-09-30 MED FILL — ESTRADIOL 0.5 MG TABS: 0.5 | 30 days supply | Qty: 30 | Fill #1

## 2020-09-30 MED FILL — METHOCARBAMOL 500 MG TABS: 500 | 30 days supply | Qty: 90 | Fill #3

## 2020-09-30 NOTE — Telephone Encounter (Signed)
Mailed letter to pt for her to contact our office.  Need to reschedule her procedure.

## 2020-09-30 NOTE — Telephone Encounter (Signed)
Lmom for pt to call me back. 

## 2020-10-01 NOTE — Telephone Encounter (Addendum)
Spoke with pt.  She rescheduled her procedure to 11/20/2020.  She is aware that [facility] will contact her regarding procedure time.  Mailing out new prep instructions to pt.  Pt tested positive for Covid on 09/21/2020 so no repeat Covid test required.  Informed [name] in [facility] by voice mail.

## 2020-10-27 ENCOUNTER — Other Ambulatory Visit: Payer: Self-pay | Admitting: Registered Nurse

## 2020-10-27 ENCOUNTER — Encounter: Payer: Self-pay | Admitting: Family Medicine

## 2020-10-27 DIAGNOSIS — M5030 Other cervical disc degeneration, unspecified cervical region: Secondary | ICD-10-CM

## 2020-10-27 DIAGNOSIS — B001 Herpesviral vesicular dermatitis: Secondary | ICD-10-CM

## 2020-10-27 MED ORDER — METHOCARBAMOL 500 MG PO TABS: ORAL_TABLET | ORAL | 3 refills | Status: DC

## 2020-10-27 MED ORDER — VALACYCLOVIR HCL 1 G PO TABS: 2000.0000 mg | ORAL_TABLET | Freq: Two times a day (BID) | ORAL | 2 refills | Status: DC

## 2020-10-27 MED FILL — valACYclovir HCL 1 GM TABS: 1 | 7 days supply | Qty: 30 | Fill #0

## 2020-10-27 MED FILL — METHOCARBAMOL 500 MG TABS: 500 | 30 days supply | Qty: 90 | Fill #0

## 2020-10-27 NOTE — Telephone Encounter (Signed)
Pt seen by Linna Darner for physical 07/03/2020 previous provider [place].  Pt requesting refills should I do acute visit?  Patient is requesting a refill of the following medications: Requested Prescriptions   Pending Prescriptions Disp Refills  . methocarbamol (ROBAXIN) 500 MG tablet 90 tablet 3    Sig: TAKE 1 TABLET BY MOUTH EVERY 8 HOURS AS NEEDED FOR MUSCLE SPASMS  . valACYclovir (VALTREX) 1000 MG tablet 30 tablet 2    Sig: Take 2 tablets (2,000 mg total) by mouth 2 (two) times daily. For 1 day per outbreak    Date of patient request: 10/27/2020 Last office visit: 07/03/2020 Date of last refill: 07/03/2020 Valtrex 05/23/2019 Last refill amount: 90 x3 refill Follow up time period per chart: PRN

## 2020-10-28 MED FILL — hydrOXYzine HCL 10 MG TABS: 10 | 90 days supply | Qty: 180 | Fill #1

## 2020-10-28 MED FILL — ESTRADIOL 0.5 MG TABS: 0.5 | 30 days supply | Qty: 30 | Fill #2

## 2020-11-10 NOTE — Telephone Encounter (Signed)
Spoke to pt.  Requested her to fax me covid test results.  She was informed that [facility] didn't receive them and needs that on file.  Pt voiced understanding and is faxing them over to [place].  Will forward them to [facility] once received.

## 2020-11-16 ENCOUNTER — Encounter (HOSPITAL_COMMUNITY): Payer: Self-pay

## 2020-11-19 ENCOUNTER — Other Ambulatory Visit (HOSPITAL_BASED_OUTPATIENT_CLINIC_OR_DEPARTMENT_OTHER): Payer: Self-pay

## 2020-11-20 ENCOUNTER — Encounter (HOSPITAL_COMMUNITY)
Admission: RE | Disposition: A | Discharge status: Home / Self Care | Payer: Self-pay | Source: Home / Self Care | Attending: Internal Medicine

## 2020-11-20 ENCOUNTER — Ambulatory Visit (HOSPITAL_COMMUNITY): Payer: 59 | Admitting: Certified Registered Nurse Anesthetist

## 2020-11-20 ENCOUNTER — Ambulatory Visit (HOSPITAL_COMMUNITY)
Admission: RE | Admit: 2020-11-20 | Discharge: 2020-11-20 | Disposition: A | Discharge status: Home / Self Care | Payer: 59 | Attending: Internal Medicine | Admitting: Internal Medicine

## 2020-11-20 ENCOUNTER — Other Ambulatory Visit: Payer: Self-pay

## 2020-11-20 ENCOUNTER — Encounter (HOSPITAL_COMMUNITY): Payer: Self-pay

## 2020-11-20 DIAGNOSIS — Z793 Long term (current) use of hormonal contraceptives: Secondary | ICD-10-CM | POA: Insufficient documentation

## 2020-11-20 DIAGNOSIS — Z823 Family history of stroke: Secondary | ICD-10-CM | POA: Diagnosis not present

## 2020-11-20 DIAGNOSIS — Z808 Family history of malignant neoplasm of other organs or systems: Secondary | ICD-10-CM | POA: Diagnosis not present

## 2020-11-20 DIAGNOSIS — Z8249 Family history of ischemic heart disease and other diseases of the circulatory system: Secondary | ICD-10-CM | POA: Insufficient documentation

## 2020-11-20 DIAGNOSIS — D123 Benign neoplasm of transverse colon: Secondary | ICD-10-CM | POA: Diagnosis not present

## 2020-11-20 DIAGNOSIS — Z833 Family history of diabetes mellitus: Secondary | ICD-10-CM | POA: Diagnosis not present

## 2020-11-20 DIAGNOSIS — K635 Polyp of colon: Secondary | ICD-10-CM | POA: Diagnosis not present

## 2020-11-20 DIAGNOSIS — K648 Other hemorrhoids: Secondary | ICD-10-CM | POA: Insufficient documentation

## 2020-11-20 DIAGNOSIS — Z79899 Other long term (current) drug therapy: Secondary | ICD-10-CM | POA: Diagnosis not present

## 2020-11-20 DIAGNOSIS — Z1211 Encounter for screening for malignant neoplasm of colon: Secondary | ICD-10-CM | POA: Diagnosis not present

## 2020-11-20 HISTORY — PX: POLYPECTOMY: SHX5525

## 2020-11-20 HISTORY — PX: COLONOSCOPY WITH PROPOFOL: SHX5780

## 2020-11-20 LAB — PREGNANCY, URINE: Preg Test, Ur: NEGATIVE

## 2020-11-20 SURGERY — COLONOSCOPY WITH PROPOFOL
Anesthesia: General

## 2020-11-20 MED ORDER — LACTATED RINGERS IV SOLN: INTRAVENOUS | Status: DC | PRN

## 2020-11-20 MED ORDER — LACTATED RINGERS IV SOLN: INTRAVENOUS | Status: DC

## 2020-11-20 MED ORDER — CHLORHEXIDINE GLUCONATE CLOTH 2 % EX PADS: 6.0000 | MEDICATED_PAD | Freq: Once | CUTANEOUS | Status: DC

## 2020-11-20 MED ORDER — LIDOCAINE HCL (CARDIAC) PF 100 MG/5ML IV SOSY
PREFILLED_SYRINGE | INTRAVENOUS | Status: DC | PRN
  Administered 2020-11-20: 50 mg via INTRAVENOUS

## 2020-11-20 MED ORDER — STERILE WATER FOR IRRIGATION IR SOLN
Status: DC | PRN
  Administered 2020-11-20: 100 mL

## 2020-11-20 MED ORDER — PROPOFOL 10 MG/ML IV BOLUS
INTRAVENOUS | Status: DC | PRN
  Administered 2020-11-20: 100 mg via INTRAVENOUS

## 2020-11-20 MED ORDER — PROPOFOL 500 MG/50ML IV EMUL
INTRAVENOUS | Status: DC | PRN
  Administered 2020-11-20: 150 ug/kg/min via INTRAVENOUS

## 2020-11-20 NOTE — Anesthesia Preprocedure Evaluation (Signed)
Anesthesia Evaluation  Patient identified by MRN, date of birth, ID band Patient awake    Reviewed: Allergy & Precautions, H&P , NPO status , Patient's Chart, lab work & pertinent test results, reviewed documented beta blocker date and time   Airway Mallampati: I  TM Distance: >3 FB Neck ROM: full    Dental no notable dental hx.    Pulmonary neg pulmonary ROS,    Pulmonary exam normal breath sounds clear to auscultation       Cardiovascular Exercise Tolerance: Good negative cardio ROS   Rhythm:regular Rate:Normal     Neuro/Psych negative neurological ROS  negative psych ROS   GI/Hepatic negative GI ROS, Neg liver ROS,   Endo/Other  negative endocrine ROS  Renal/GU negative Renal ROS  negative genitourinary   Musculoskeletal   Abdominal   Peds  Hematology negative hematology ROS (+)   Anesthesia Other Findings   Reproductive/Obstetrics negative OB ROS                             Anesthesia Physical Anesthesia Plan  ASA: II  Anesthesia Plan: General   Post-op Pain Management:    Induction:   PONV Risk Score and Plan: Propofol infusion  Airway Management Planned:   Additional Equipment:   Intra-op Plan:   Post-operative Plan:   Informed Consent: I have reviewed the patients History and Physical, chart, labs and discussed the procedure including the risks, benefits and alternatives for the proposed anesthesia with the patient or authorized representative who has indicated his/her understanding and acceptance.     Dental Advisory Given  Plan Discussed with: CRNA  Anesthesia Plan Comments:         Anesthesia Quick Evaluation

## 2020-11-20 NOTE — H&P (Signed)
Primary Care Physician:  Jacelyn Pi, Lilia Argue, MD Primary Gastroenterologist:  Dr. Abbey Chatters  Pre-Procedure History & Physical: HPI:  Kayla Chaney is a 45 y.o. female is here for a colonoscopy for colon cancer screening purposes.  Patient denies any family history of colorectal cancer.  No melena or hematochezia.  No abdominal pain or unintentional weight loss.  No change in bowel habits.  Overall feels well from a GI standpoint.  History reviewed. No pertinent past medical history.  Past Surgical History:  Procedure Laterality Date  . CESAREAN SECTION  2003, 2008    Prior to Admission medications   Medication Sig Start Date End Date Taking? Authorizing Provider  cholecalciferol (VITAMIN D3) 25 MCG (1000 UNIT) tablet Take 1,000 Units by mouth daily.   Yes [provider]  estradiol (ESTRACE) 0.5 MG tablet Take 1 tablet (0.5 mg total) by mouth daily. 09/04/20  Yes Joseph Pierini, MD  hydrOXYzine (ATARAX/VISTARIL) 10 MG tablet Take 1 or 2 as needed at bedtime. Patient taking differently: Take 10-20 mg by mouth at bedtime. 07/03/20  Yes Posey Boyer, MD  levonorgestrel (MIRENA) 20 MCG/24HR IUD 1 each by Intrauterine route once.   Yes [provider]  methocarbamol (ROBAXIN) 500 MG tablet TAKE 1 TABLET BY MOUTH EVERY 8 HOURS AS NEEDED FOR MUSCLE SPASMS 10/27/20  Yes Maximiano Coss, NP  valACYclovir (VALTREX) 1000 MG tablet Take 2 tablets (2,000 mg total) by mouth 2 (two) times daily. For 1 day per outbreak 10/27/20   Maximiano Coss, NP    Allergies as of 09/09/2020  . (No Known Allergies)    Family History  Problem Relation Age of Onset  . Stroke Mother   . Hypertension Father   . Diabetes Father   . Brain cancer Sister   . Stroke Maternal Grandmother     Social History   Socioeconomic History  . Marital status: Married    Spouse name: Not on file  . Number of children: Not on file  . Years of education: Not on file  . Highest education level: Not on file   Occupational History  . Not on file  Tobacco Use  . Smoking status: Never Smoker  . Smokeless tobacco: Never Used  Substance and Sexual Activity  . Alcohol use: No  . Drug use: No  . Sexual activity: Yes  Other Topics Concern  . Not on file  Social History Narrative  . Not on file   Social Determinants of Health   Financial Resource Strain: Not on file  Food Insecurity: Not on file  Transportation Needs: Not on file  Physical Activity: Not on file  Stress: Not on file  Social Connections: Not on file  Intimate Partner Violence: Not on file    Review of Systems: See HPI, otherwise negative ROS  Physical Exam: Vital signs in last 24 hours: Temp:  [99 F (37.2 C)] 99 F (37.2 C) (03/25 0654) Pulse Rate:  [61] 61 (03/25 0654) Resp:  [12] 12 (03/25 0654) BP: (106)/(66) 106/66 (03/25 0654) SpO2:  [97 %] 97 % (03/25 0654)   General:   Alert,  Well-developed, well-nourished, pleasant and cooperative in NAD Head:  Normocephalic and atraumatic. Eyes:  Sclera clear, no icterus.   Conjunctiva pink. Ears:  Normal auditory acuity. Nose:  No deformity, discharge,  or lesions. Mouth:  No deformity or lesions, dentition normal. Neck:  Supple; no masses or thyromegaly. Lungs:  Clear throughout to auscultation.   No wheezes, crackles, or rhonchi. No acute distress.  Heart:  Regular rate and rhythm; no murmurs, clicks, rubs,  or gallops. Abdomen:  Soft, nontender and nondistended. No masses, hepatosplenomegaly or hernias noted. Normal bowel sounds, without guarding, and without rebound.   Msk:  Symmetrical without gross deformities. Normal posture. Extremities:  Without clubbing or edema. Neurologic:  Alert and  oriented x4;  grossly normal neurologically. Skin:  Intact without significant lesions or rashes. Cervical Nodes:  No significant cervical adenopathy. Psych:  Alert and cooperative. Normal mood and affect.  Impression/Plan: Kayla Chaney is here for a colonoscopy to be  performed for colon cancer screening purposes.  The risks of the procedure including infection, bleed, or perforation as well as benefits, limitations, alternatives and imponderables have been reviewed with the patient. Questions have been answered. All parties agreeable.

## 2020-11-20 NOTE — Transfer of Care (Signed)
Immediate Anesthesia Transfer of Care Note  Patient: Kayla Chaney  Procedure(s) Performed: COLONOSCOPY WITH PROPOFOL (N/A ) POLYPECTOMY  Patient Location: PACU  Anesthesia Type:General  Level of Consciousness: awake  Airway & Oxygen Therapy: Patient Spontanous Breathing  Post-op Assessment: Report given to RN and Post -op Vital signs reviewed and stable  Post vital signs: Reviewed and stable  Last Vitals:  Vitals Value Taken Time  BP    Temp    Pulse    Resp    SpO2      Last Pain:  Vitals:   11/20/20 0731  TempSrc:   PainSc: 0-No pain      Patients Stated Pain Goal: 5 (34/74/25 9563)  Complications: No complications documented.

## 2020-11-20 NOTE — Discharge Instructions (Addendum)
Colonoscopy Discharge Instructions  Read the instructions outlined below and refer to this sheet in the next few weeks. These discharge instructions provide you with general information on caring for yourself after you leave the hospital. Your doctor may also give you specific instructions. While your treatment has been planned according to the most current medical practices available, unavoidable complications occasionally occur.   ACTIVITY  You may resume your regular activity, but move at a slower pace for the next 24 hours.   Take frequent rest periods for the next 24 hours.   Walking will help get rid of the air and reduce the bloated feeling in your belly (abdomen).   No driving for 24 hours (because of the medicine (anesthesia) used during the test).    Do not sign any important legal documents or operate any machinery for 24 hours (because of the anesthesia used during the test).  NUTRITION  Drink plenty of fluids.   You may resume your normal diet as instructed by your doctor.   Begin with a light meal and progress to your normal diet. Heavy or fried foods are harder to digest and may make you feel sick to your stomach (nauseated).   Avoid alcoholic beverages for 24 hours or as instructed.  MEDICATIONS  You may resume your normal medications unless your doctor tells you otherwise.  WHAT YOU CAN EXPECT TODAY  Some feelings of bloating in the abdomen.   Passage of more gas than usual.   Spotting of blood in your stool or on the toilet paper.  IF YOU HAD POLYPS REMOVED DURING THE COLONOSCOPY:  No aspirin products for 7 days or as instructed.   No alcohol for 7 days or as instructed.   Eat a soft diet for the next 24 hours.  FINDING OUT THE RESULTS OF YOUR TEST Not all test results are available during your visit. If your test results are not back during the visit, make an appointment with your caregiver to find out the results. Do not assume everything is normal if  you have not heard from your caregiver or the medical facility. It is important for you to follow up on all of your test results.  SEEK IMMEDIATE MEDICAL ATTENTION IF:  You have more than a spotting of blood in your stool.   Your belly is swollen (abdominal distention).   You are nauseated or vomiting.   You have a temperature over 101.   You have abdominal pain or discomfort that is severe or gets worse throughout the day.   Your colonoscopy revealed 1 polyp(s) which I removed successfully. Await pathology results, my office will contact you. I recommend repeating colonoscopy in 7-10 years for surveillance purposes depending on pathology. Otherwise follow up with GI as needed.  I hope you have a great rest of your week!  Elon Alas. Abbey Chatters, D.O. Gastroenterology and Hepatology [facility]   Colon Polyps  Colon polyps are tissue growths inside the colon, which is part of the large intestine. They are one of the types of polyps that can grow in the body. A polyp may be a round bump or a mushroom-shaped growth. You could have one polyp or more than one. Most colon polyps are noncancerous (benign). However, some colon polyps can become cancerous over time. Finding and removing the polyps early can help prevent this. What are the causes? The exact cause of colon polyps is not known. What increases the risk? The following factors may make you more likely  to develop this condition:  Having a family history of colorectal cancer or colon polyps.  Being older than 46 years of age.  Being younger than 46 years of age and having a significant family history of colorectal cancer or colon polyps or a genetic condition that puts you at higher risk of getting colon polyps.  Having inflammatory bowel disease, such as ulcerative colitis or Crohn's disease.  Having certain conditions passed from parent to child (hereditary conditions), such as: ? Familial adenomatous  polyposis (FAP). ? Lynch syndrome. ? Turcot syndrome. ? Peutz-Jeghers syndrome. ? MUTYH-associated polyposis (MAP).  Being overweight.  Certain lifestyle factors. These include smoking cigarettes, drinking too much alcohol, not getting enough exercise, and eating a diet that is high in fat and red meat and low in fiber.  Having had childhood cancer that was treated with radiation of the abdomen. What are the signs or symptoms? Many times, there are no symptoms. If you have symptoms, they may include:  Blood coming from the rectum during a bowel movement.  Blood in the stool (feces). The blood may be bright red or very dark in color.  Pain in the abdomen.  A change in bowel habits, such as constipation or diarrhea. How is this diagnosed? This condition is diagnosed with a colonoscopy. This is a procedure in which a lighted, flexible scope is inserted into the opening between the buttocks (anus) and then passed into the colon to examine the area. Polyps are sometimes found when a colonoscopy is done as part of routine cancer screening tests. How is this treated? This condition is treated by removing any polyps that are found. Most polyps can be removed during a colonoscopy. Those polyps will then be tested for cancer. Additional treatment may be needed depending on the results of testing. Follow these instructions at home: Eating and drinking  Eat foods that are high in fiber, such as fruits, vegetables, and whole grains.  Eat foods that are high in calcium and vitamin D, such as milk, cheese, yogurt, eggs, liver, fish, and broccoli.  Limit foods that are high in fat, such as fried foods and desserts.  Limit the amount of red meat, precooked or cured meat, or other processed meat that you eat, such as hot dogs, sausages, bacon, or meat loaves.  Limit sugary drinks.   Lifestyle  Maintain a healthy weight, or lose weight if recommended by your health care provider.  Exercise  every day or as told by your health care provider.  Do not use any products that contain nicotine or tobacco, such as cigarettes, e-cigarettes, and chewing tobacco. If you need help quitting, ask your health care provider.  Do not drink alcohol if: ? Your health care provider tells you not to drink. ? You are pregnant, may be pregnant, or are planning to become pregnant.  If you drink alcohol: ? Limit how much you use to:  0-1 drink a day for women.  0-2 drinks a day for men. ? Know how much alcohol is in your drink. In the [place] one drink equals one 12 oz bottle of beer (355 mL), one 5 oz glass of wine (148 mL), or one 1 oz glass of hard liquor (44 mL). General instructions  Take over-the-counter and prescription medicines only as told by your health care provider.  Keep all follow-up visits. This is important. This includes having regularly scheduled colonoscopies. Talk to your health care provider about when you need a colonoscopy. Contact a health care provider  if:  You have new or worsening bleeding during a bowel movement.  You have new or increased blood in your stool.  You have a change in bowel habits.  You lose weight for no known reason. Summary  Colon polyps are tissue growths inside the colon, which is part of the large intestine. They are one type of polyp that can grow in the body.  Most colon polyps are noncancerous (benign), but some can become cancerous over time.  This condition is diagnosed with a colonoscopy.  This condition is treated by removing any polyps that are found. Most polyps can be removed during a colonoscopy. This information is not intended to replace advice given to you by your health care provider. Make sure you discuss any questions you have with your health care provider. Document Revised: 12/04/2019 Document Reviewed: 12/04/2019 Elsevier Patient Education  2021 [employer].

## 2020-11-20 NOTE — Anesthesia Postprocedure Evaluation (Signed)
Anesthesia Post Note  Patient: Kayla Chaney  Procedure(s) Performed: COLONOSCOPY WITH PROPOFOL (N/A ) POLYPECTOMY  Patient location during evaluation: Phase II Anesthesia Type: General Level of consciousness: awake and alert Pain management: satisfactory to patient Vital Signs Assessment: post-procedure vital signs reviewed and stable Respiratory status: spontaneous breathing and respiratory function stable Cardiovascular status: blood pressure returned to baseline Postop Assessment: no apparent nausea or vomiting Anesthetic complications: no   No complications documented.   Last Vitals:  Vitals:   11/20/20 0654  BP: 106/66  Pulse: 61  Resp: 12  Temp: 37.2 C  SpO2: 97%    Last Pain:  Vitals:   11/20/20 0731  TempSrc:   PainSc: 0-No pain                 Karna Dupes

## 2020-11-20 NOTE — Op Note (Signed)
[facility] Patient Name: Kayla Chaney Procedure Date: 11/20/2020 7:11 AM MRN: [id number] Date of Birth: [birth year] Attending MD: Elon Alas. Abbey Chatters DO CSN: [id number] Age: 45 Admit Type: Outpatient Procedure:                Colonoscopy Indications:              Screening for colorectal malignant neoplasm Providers:                Elon Alas. Abbey Chatters, DO, Lambert Mody, Dereck Leep, Technician Referring MD:              Medicines:                See the Anesthesia note for documentation of the                            administered medications Complications:            No immediate complications. Estimated Blood Loss:     Estimated blood loss was minimal. Procedure:                Pre-Anesthesia Assessment:                           - The anesthesia plan was to use monitored                            anesthesia care (MAC).                           After obtaining informed consent, the colonoscope                            was passed under direct vision. Throughout the                            procedure, the patient's blood pressure, pulse, and                            oxygen saturations were monitored continuously. The                            [id number]-[id number] ([id number]) scope was introduced through                            the anus and advanced to the the cecum, identified                            by appendiceal orifice and ileocecal valve. The                            colonoscopy was performed without difficulty. The                            patient tolerated the procedure well.  Scope In: 7:34:15 AM Scope Out: 7:45:58 AM Scope Withdrawal Time: 0 hours 6 minutes 8 seconds  Total Procedure Duration: 0 hours 11 minutes 43 seconds  Findings:      The perianal and digital rectal examinations were normal.      Non-bleeding internal hemorrhoids were found during endoscopy.      A 2 mm polyp was found in the transverse colon. The  polyp was sessile.       The polyp was removed with a cold biopsy forceps. Resection and       retrieval were complete. Impression:               - Non-bleeding internal hemorrhoids.                           - One 2 mm polyp in the transverse colon, removed                            with a cold biopsy forceps. Resected and retrieved. Moderate Sedation:      Per Anesthesia Care Recommendation:           - Patient has a contact number available for                            emergencies. The signs and symptoms of potential                            delayed complications were discussed with the                            patient. Return to normal activities tomorrow.                            Written discharge instructions were provided to the                            patient.                           - Resume previous diet.                           - Continue present medications.                           - Await pathology results.                           - Repeat colonoscopy in 7-10 years for surveillance                            based on pathology results.                           - Return to GI clinic PRN. Procedure Code(s):        --- Professional ---  45380, Colonoscopy, flexible; with biopsy, single                            or multiple Diagnosis Code(s):        --- Professional ---                           Z12.11, Encounter for screening for malignant                            neoplasm of colon                           K63.5, Polyp of colon                           K64.8, Other hemorrhoids CPT copyright 2019 American Medical Association. All rights reserved. The codes documented in this report are preliminary and upon coder review may  be revised to meet current compliance requirements. Elon Alas. Abbey Chatters, DO [place] Abbey Chatters, DO 11/20/2020 7:53:36 AM This report has been signed electronically. Number of Addenda: 0

## 2020-11-23 LAB — SURGICAL PATHOLOGY

## 2020-11-23 MED FILL — ESTRADIOL 0.5 MG TABS: 0.5 | 30 days supply | Qty: 30 | Fill #3

## 2020-11-23 MED FILL — METHOCARBAMOL 500 MG TABS: 500 | 30 days supply | Qty: 90 | Fill #1

## 2020-11-27 ENCOUNTER — Encounter (HOSPITAL_COMMUNITY): Payer: Self-pay | Admitting: Internal Medicine

## 2020-12-28 ENCOUNTER — Other Ambulatory Visit (HOSPITAL_COMMUNITY): Payer: Self-pay

## 2020-12-28 MED FILL — Estradiol Tab 0.5 MG: ORAL | 30 days supply | Qty: 30 | Fill #0 | Status: AC

## 2020-12-28 MED FILL — Methocarbamol Tab 500 MG: ORAL | 30 days supply | Qty: 90 | Fill #0 | Status: AC

## 2020-12-30 ENCOUNTER — Other Ambulatory Visit (HOSPITAL_COMMUNITY): Payer: Self-pay

## 2021-01-26 MED FILL — Estradiol Tab 0.5 MG: ORAL | 30 days supply | Qty: 30 | Fill #1 | Status: AC

## 2021-01-26 MED FILL — Methocarbamol Tab 500 MG: ORAL | 30 days supply | Qty: 90 | Fill #1 | Status: AC

## 2021-01-27 ENCOUNTER — Other Ambulatory Visit (HOSPITAL_COMMUNITY): Payer: Self-pay

## 2021-02-02 DIAGNOSIS — H5213 Myopia, bilateral: Secondary | ICD-10-CM | POA: Diagnosis not present

## 2021-02-02 MED FILL — Hydroxyzine HCl Tab 10 MG: ORAL | 90 days supply | Qty: 180 | Fill #0 | Status: AC

## 2021-02-03 ENCOUNTER — Other Ambulatory Visit (HOSPITAL_COMMUNITY): Payer: Self-pay

## 2021-02-25 MED FILL — Estradiol Tab 0.5 MG: ORAL | 30 days supply | Qty: 30 | Fill #2 | Status: AC

## 2021-02-26 ENCOUNTER — Other Ambulatory Visit (HOSPITAL_COMMUNITY): Payer: Self-pay

## 2021-03-08 ENCOUNTER — Other Ambulatory Visit: Payer: Self-pay | Admitting: Registered Nurse

## 2021-03-08 DIAGNOSIS — M5030 Other cervical disc degeneration, unspecified cervical region: Secondary | ICD-10-CM

## 2021-03-08 MED ORDER — METHOCARBAMOL 500 MG PO TABS
ORAL_TABLET | Freq: Three times a day (TID) | ORAL | 3 refills | Status: DC | PRN
  Filled 2021-03-08: qty 90, 30d supply, fill #0
  Filled 2021-04-14: qty 90, 30d supply, fill #1
  Filled 2021-05-12: qty 90, 30d supply, fill #2
  Filled 2021-06-10: qty 90, 30d supply, fill #3

## 2021-03-08 NOTE — Telephone Encounter (Signed)
need a refill until can establish with new provider appointment scheduled for 04/09/21  Last filled 10/27/20 #90 with no refills Last visit 07/03/20 Dr. Linna Darner

## 2021-03-09 ENCOUNTER — Other Ambulatory Visit (HOSPITAL_COMMUNITY): Payer: Self-pay

## 2021-03-11 ENCOUNTER — Ambulatory Visit (HOSPITAL_COMMUNITY)
Admission: RE | Admit: 2021-03-11 | Discharge: 2021-03-11 | Disposition: A | Discharge status: Home / Self Care | Payer: 59 | Source: Ambulatory Visit | Attending: Family Medicine | Admitting: Family Medicine

## 2021-03-11 ENCOUNTER — Other Ambulatory Visit: Payer: Self-pay

## 2021-03-11 ENCOUNTER — Other Ambulatory Visit (HOSPITAL_COMMUNITY): Payer: Self-pay | Admitting: Family Medicine

## 2021-03-11 DIAGNOSIS — Z1231 Encounter for screening mammogram for malignant neoplasm of breast: Secondary | ICD-10-CM

## 2021-03-28 MED FILL — Estradiol Tab 0.5 MG: ORAL | 30 days supply | Qty: 30 | Fill #3 | Status: AC

## 2021-03-29 ENCOUNTER — Ambulatory Visit: Payer: 59

## 2021-03-29 ENCOUNTER — Other Ambulatory Visit (HOSPITAL_COMMUNITY): Payer: Self-pay

## 2021-03-29 ENCOUNTER — Ambulatory Visit (INDEPENDENT_AMBULATORY_CARE_PROVIDER_SITE_OTHER): Payer: 59 | Admitting: Orthopedic Surgery

## 2021-03-29 ENCOUNTER — Other Ambulatory Visit: Payer: Self-pay

## 2021-03-29 VITALS — BP 144/91 | HR 84 | Ht 65.0 in | Wt 175.5 lb

## 2021-03-29 DIAGNOSIS — M25571 Pain in right ankle and joints of right foot: Secondary | ICD-10-CM

## 2021-03-29 MED ORDER — MELOXICAM 7.5 MG PO TABS
7.5000 mg | ORAL_TABLET | Freq: Every day | ORAL | 5 refills | Status: DC
  Filled 2021-03-29: qty 30, 30d supply, fill #0
  Filled 2021-04-23: qty 30, 30d supply, fill #1
  Filled 2021-05-26: qty 30, 30d supply, fill #2
  Filled 2021-08-24: qty 30, 30d supply, fill #3
  Filled 2021-09-22: qty 30, 30d supply, fill #4

## 2021-03-29 MED ORDER — PREDNISONE 10 MG (48) PO TBPK
ORAL_TABLET | Freq: Every day | ORAL | 0 refills | Status: DC
  Filled 2021-03-29: qty 48, 12d supply, fill #0

## 2021-03-29 NOTE — Progress Notes (Signed)
New patient new problem  Chief Complaint  Patient presents with   Ankle Pain    R/hurts at night, its medial pain and it hurts in certain positions when I exercise.    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  BP (!) 144/91   Pulse 84   Ht '5\' 5"'$  (1.651 m)   Wt 175 lb 8 oz (79.6 kg)   BMI 29.20 kg/m   No past medical history on file.  Physical Exam  Constitutional: Development normal, nutrition normal, body habitus normal Mental status oriented x3 mood and affect no depression Cardiovascular pulses and temperature normal   Gait normal  Musculoskeletal:  Inspection: Right foot severe flatfoot the heel does come to inversion on standing posterior tibial tendon nontender actually cannot palpate any tenderness no pain with inversion and eversion pain hard to reproduce Range of motion: Normal Stability: Normal Muscle strength and tone: Single-leg rise 5 times no difficulty  Skin warm dry and intact no erythema  Neurological sensation normal  Assessment and plan:  X-ray was negative for any ankle or foot abnormality other than the pes planus  Possible sustentaculum tali ligament injury  Recommend Dosepak supportive shoe wear  Meds ordered this encounter  Medications   predniSONE (STERAPRED UNI-PAK 48 TAB) 10 MG (48) TBPK tablet    Sig: Take by mouth daily. 12 days ds as directed    Dispense:  48 tablet    Refill:  0   meloxicam (MOBIC) 7.5 MG tablet    Sig: Take 1 tablet  by mouth daily.    Dispense:  30 tablet    Refill:  5

## 2021-04-02 ENCOUNTER — Ambulatory Visit: Payer: 59 | Admitting: Family Medicine

## 2021-04-09 ENCOUNTER — Ambulatory Visit: Payer: 59 | Admitting: Nurse Practitioner

## 2021-04-14 ENCOUNTER — Other Ambulatory Visit (HOSPITAL_COMMUNITY): Payer: Self-pay

## 2021-04-23 MED FILL — Hydroxyzine HCl Tab 10 MG: ORAL | 90 days supply | Qty: 180 | Fill #1 | Status: AC

## 2021-04-23 MED FILL — Estradiol Tab 0.5 MG: ORAL | 30 days supply | Qty: 30 | Fill #4 | Status: AC

## 2021-04-24 ENCOUNTER — Other Ambulatory Visit (HOSPITAL_COMMUNITY): Payer: Self-pay

## 2021-05-12 ENCOUNTER — Other Ambulatory Visit (HOSPITAL_COMMUNITY): Payer: Self-pay

## 2021-05-12 MED FILL — Valacyclovir HCl Tab 1 GM: ORAL | 8 days supply | Qty: 30 | Fill #0 | Status: AC

## 2021-05-26 MED FILL — Estradiol Tab 0.5 MG: ORAL | 30 days supply | Qty: 30 | Fill #5 | Status: AC

## 2021-05-27 ENCOUNTER — Other Ambulatory Visit (HOSPITAL_COMMUNITY): Payer: Self-pay

## 2021-06-10 ENCOUNTER — Other Ambulatory Visit (HOSPITAL_COMMUNITY): Payer: Self-pay

## 2021-06-29 MED FILL — Estradiol Tab 0.5 MG: ORAL | 30 days supply | Qty: 30 | Fill #6 | Status: AC

## 2021-06-30 ENCOUNTER — Other Ambulatory Visit (HOSPITAL_COMMUNITY): Payer: Self-pay

## 2021-07-02 ENCOUNTER — Ambulatory Visit: Payer: 59 | Admitting: Nurse Practitioner

## 2021-07-09 ENCOUNTER — Ambulatory Visit: Payer: 59 | Admitting: Nurse Practitioner

## 2021-07-09 ENCOUNTER — Other Ambulatory Visit: Payer: Self-pay

## 2021-07-09 ENCOUNTER — Other Ambulatory Visit (HOSPITAL_COMMUNITY): Payer: Self-pay

## 2021-07-09 ENCOUNTER — Encounter: Payer: Self-pay | Admitting: Nurse Practitioner

## 2021-07-09 VITALS — BP 115/75 | HR 69 | Ht 65.5 in | Wt 170.0 lb

## 2021-07-09 DIAGNOSIS — Z975 Presence of (intrauterine) contraceptive device: Secondary | ICD-10-CM | POA: Diagnosis not present

## 2021-07-09 DIAGNOSIS — N951 Menopausal and female climacteric states: Secondary | ICD-10-CM

## 2021-07-09 DIAGNOSIS — E7800 Pure hypercholesterolemia, unspecified: Secondary | ICD-10-CM

## 2021-07-09 DIAGNOSIS — E785 Hyperlipidemia, unspecified: Secondary | ICD-10-CM | POA: Diagnosis not present

## 2021-07-09 DIAGNOSIS — Z139 Encounter for screening, unspecified: Secondary | ICD-10-CM

## 2021-07-09 DIAGNOSIS — R002 Palpitations: Secondary | ICD-10-CM

## 2021-07-09 DIAGNOSIS — Z0000 Encounter for general adult medical examination without abnormal findings: Secondary | ICD-10-CM | POA: Diagnosis not present

## 2021-07-09 DIAGNOSIS — M5030 Other cervical disc degeneration, unspecified cervical region: Secondary | ICD-10-CM | POA: Diagnosis not present

## 2021-07-09 DIAGNOSIS — G479 Sleep disorder, unspecified: Secondary | ICD-10-CM

## 2021-07-09 HISTORY — DX: Presence of (intrauterine) contraceptive device: Z97.5

## 2021-07-09 HISTORY — DX: Menopausal and female climacteric states: N95.1

## 2021-07-09 MED ORDER — METHOCARBAMOL 500 MG PO TABS
ORAL_TABLET | Freq: Three times a day (TID) | ORAL | 3 refills | Status: DC | PRN
  Filled 2021-07-09: qty 90, 30d supply, fill #0
  Filled 2021-08-07: qty 90, 30d supply, fill #1
  Filled 2021-09-03: qty 90, 30d supply, fill #2
  Filled 2021-10-05: qty 90, 30d supply, fill #3

## 2021-07-09 MED ORDER — HYDROXYZINE HCL 10 MG PO TABS
ORAL_TABLET | Freq: Every evening | ORAL | 3 refills | Status: DC | PRN
  Filled 2021-07-09: qty 180, 90d supply, fill #0
  Filled 2021-11-07: qty 180, 90d supply, fill #1
  Filled 2022-02-03: qty 180, 90d supply, fill #2
  Filled 2022-04-29: qty 180, 90d supply, fill #3

## 2021-07-09 NOTE — Assessment & Plan Note (Signed)
Benign post cardiology eval.  Pt reports that palpitations are better since she stopped taking caffene and diet soda. Normal exam today.

## 2021-07-09 NOTE — Assessment & Plan Note (Signed)
Symptoms well controlled , takes estradiol managed by GYN .

## 2021-07-09 NOTE — Patient Instructions (Signed)
Annual exam and labs done today.  Eat a healthy diet, including lots of fruits and vegetables. Avoid foods with a lot of saturated and trans fats, such as red meat, butter, fried foods and cheese . Maintain a healthy weight.    Thanks for choosing [facility] for your health care needs. We appreciate the opportunity to serve you.

## 2021-07-09 NOTE — Assessment & Plan Note (Addendum)
Patient unable to tolerate crestor in the past. Lipid panel obtained today. Will get back to pt after lab result are out.  Pt willing to start another class of cholesterol med.  Importance of diet and exercise discussed with pt.

## 2021-07-09 NOTE — Assessment & Plan Note (Addendum)
Managed by Dr. at [place] reports no adverse effects from use of Mirena IUD.

## 2021-07-09 NOTE — Progress Notes (Signed)
New Patient Office Visit  Subjective:  Patient ID: Kayla Chaney, female    DOB: Sep 17, 1975  Age: 45 y.o. MRN: [id number]  CC:  Chief Complaint  Patient presents with   New Patient (Initial Visit)    Previous PCP was [place] presents for establish care and annual exam Patient denies any concerns today. Tolerating all her medications well and no new allergy to medication.  She sees orthopedics for ankle pain. Takes Meloxicam 7.27m daily.  [place]  Colonoscopy normal done in 2022.  PAP normal done in 2020  Mammogram done 2022 result was normal.   She saw cardiology for heart palpitations. She has stopped taking caffeine and diet soda. Palpitations is under control.    Need for monthly self breast exam discussed with patient.   Past Medical History:  Diagnosis Date   Hot flashes due to menopause 07/09/2021   Hyperlipidemia    IUD (intrauterine device) in place 07/09/2021    Past Surgical History:  Procedure Laterality Date   CESAREAN SECTION  2003, 2008   COLONOSCOPY WITH PROPOFOL N/A 11/20/2020   Procedure: COLONOSCOPY WITH PROPOFOL;  Surgeon: CEloise Harman DO;  [facility];  Service: Endoscopy;  Laterality: N/A;  am/ASA II, pt tested +1/24 thru [facility] <90 - [place]- advised pt current arrival time of 7:00 - needed to know for transportation cy 3/21   POLYPECTOMY  11/20/2020   Procedure: POLYPECTOMY;  Surgeon: CEloise Harman DO;  Location: [facility];  Service: Endoscopy;;    Family History  Problem Relation Age of Onset   Stroke Mother    Hypertension Father    Diabetes Father    Brain cancer Sister    Stroke Maternal Grandmother     Social History   Socioeconomic History   Marital status: Married    Spouse name: Not on file   Number of children: Not on file   Years of education: Not on file   Highest education level: Not on file   Occupational History    Comment: XRAY tech  Tobacco Use   Smoking status: Never   Smokeless tobacco: Never  Substance and Sexual Activity   Alcohol use: No   Drug use: No   Sexual activity: Yes  Other Topics Concern   Not on file  Social History Narrative   Not on file   Social Determinants of Health   Financial Resource Strain: Not on file  Food Insecurity: Not on file  Transportation Needs: Not on file  Physical Activity: Not on file  Stress: Not on file  Social Connections: Not on file  Intimate Partner Violence: Not on file    ROS Review of Systems  Constitutional:  Negative for activity change, appetite change, chills and diaphoresis.  HENT: Negative.  Negative for congestion, dental problem, drooling, ear discharge, ear pain and facial swelling.   Eyes: Negative.  Negative for pain, discharge, redness and itching.  Respiratory: Negative.  Negative for apnea, cough, choking, chest tightness and shortness of breath.   Cardiovascular: Negative.  Negative for chest pain, palpitations and leg swelling.  Gastrointestinal:  Negative for abdominal distention, abdominal pain, anal bleeding, blood in stool and constipation.  Endocrine: Negative.  Negative for cold intolerance, heat intolerance, polydipsia, polyphagia and polyuria.  Genitourinary:  Negative for difficulty urinating, dyspareunia, dysuria, enuresis and flank pain.  Skin:  Negative for color  change, pallor, rash and wound.  Allergic/Immunologic: Negative for environmental allergies and food allergies.  Neurological:  Negative for dizziness, facial asymmetry, light-headedness, numbness and headaches.  Hematological:  Negative for adenopathy. Does not bruise/bleed easily.  Psychiatric/Behavioral:  Negative for agitation, behavioral problems and dysphoric mood.    Objective:   Today's Vitals: BP 115/75 (BP Location: Left Arm, Patient Position: Sitting, Cuff Size: Normal)   Pulse 69   Ht 5' 5.5" (1.664 m)   Wt 170  lb (77.1 kg)   SpO2 95%   BMI 27.86 kg/m   Physical Exam HENT:     Right Ear: External ear normal.     Left Ear: External ear normal.     Nose: Nose normal. No congestion or rhinorrhea.     Mouth/Throat:     Mouth: Mucous membranes are moist.  Eyes:     Pupils: Pupils are equal, round, and reactive to light.  Cardiovascular:     Rate and Rhythm: Normal rate and regular rhythm.     Pulses: Normal pulses.     Heart sounds: Normal heart sounds.  Pulmonary:     Effort: Pulmonary effort is normal. No respiratory distress.     Breath sounds: No stridor. No wheezing, rhonchi or rales.  Abdominal:     Palpations: There is no mass.     Tenderness: There is no abdominal tenderness. There is no left CVA tenderness, guarding or rebound.     Hernia: No hernia is present.  Musculoskeletal:        General: No swelling or tenderness. Normal range of motion.     Cervical back: Neck supple. No rigidity or tenderness.     Right lower leg: No edema.     Left lower leg: No edema.  Skin:    Capillary Refill: Capillary refill takes less than 2 seconds.     Coloration: Skin is not jaundiced or pale.     Findings: No bruising or erythema.  Neurological:     General: No focal deficit present.     Mental Status: She is oriented to person, place, and time.     Sensory: No sensory deficit.     Motor: No weakness.     Coordination: Coordination normal.     Gait: Gait normal.  Psychiatric:        Mood and Affect: Mood normal.        Behavior: Behavior normal.        Judgment: Judgment normal.    Assessment & Plan:   Problem List Items Addressed This Visit       Cardiovascular and Mediastinum   Hot flashes due to menopause    Symptoms well controlled , takes estradiol managed by GYN .        Other   Annual physical exam - Primary   Relevant Orders   CMP14+EGFR   HgB A1c   Vitamin D (25 hydroxy)   Lipid Profile   Palpitations    Benign post cardiology eval.  Pt reports that  palpitations are better since she stopped taking caffene and diet soda. Normal exam today.       IUD (intrauterine device) in place    Managed by Dr. at [place] reports no adverse effects from use of Mirena IUD.       Hyperlipemia    Patient unable to tolerate crestor in the past. Lipid panel obtained today. Will get back to pt after lab result are out.  Pt willing to start another  class of cholesterol med.  Importance of diet and exercise discussed with pt.       Relevant Orders   TSH   CBC   Other Visit Diagnoses     Screening due       Relevant Orders   Hepatitis C Antibody   Sleep disturbance       Relevant Medications   hydrOXYzine (ATARAX/VISTARIL) 10 MG tablet   DDD (degenerative disc disease), cervical       Relevant Medications   methocarbamol (ROBAXIN) 500 MG tablet       Outpatient Encounter Medications as of 07/09/2021  Medication Sig   cholecalciferol (VITAMIN D3) 25 MCG (1000 UNIT) tablet Take 1,000 Units by mouth daily.   estradiol (ESTRACE) 0.5 MG tablet TAKE 1 TABLET BY MOUTH ONCE A DAY   levonorgestrel (MIRENA) 20 MCG/24HR IUD 1 each by Intrauterine route once.   meloxicam (MOBIC) 7.5 MG tablet Take 1 tablet  by mouth daily.   valACYclovir (VALTREX) 1000 MG tablet TAKE 2 TABLETS BY MOUTH 2 TIMES DAILY FOR 1 DAY FOR OUTBREAK   [DISCONTINUED] hydrOXYzine (ATARAX/VISTARIL) 10 MG tablet TAKE 1 TO 2 TABLETS BY MOUTH AT BEDTIME AS NEEDED (Patient taking differently: Take 10-20 mg by mouth at bedtime.)   [DISCONTINUED] methocarbamol (ROBAXIN) 500 MG tablet Take 1 tablet by mouth every 8 (eight) hours as needed for muscle spasms.   hydrOXYzine (ATARAX/VISTARIL) 10 MG tablet TAKE 1 TO 2 TABLETS BY MOUTH AT BEDTIME AS NEEDED   methocarbamol (ROBAXIN) 500 MG tablet Take 1 tablet by mouth every 8 (eight) hours as needed for muscle spasms.   [DISCONTINUED] estradiol (ESTRACE) 0.5 MG tablet Take 1 tablet by mouth daily.   [DISCONTINUED] predniSONE (STERAPRED  UNI-PAK 48 TAB) 10 MG (48) TBPK tablet Take 6 tablets x 4 days. Take 4 tablets x 4 days.  Then take 2 tablets x 4 days.   [DISCONTINUED] rosuvastatin (CRESTOR) 5 MG tablet Take 1 tablet (5 mg total) by mouth daily. (Patient not taking: Reported on 09/04/2020)   No facility-administered encounter medications on file as of 07/09/2021.    Follow-up: Return in about 6 months (around 01/06/2022).   Renee Rival, FNP

## 2021-07-10 ENCOUNTER — Other Ambulatory Visit (HOSPITAL_COMMUNITY): Payer: Self-pay

## 2021-07-10 LAB — HEPATITIS C ANTIBODY: Hep C Virus Ab: 0.2 s/co ratio (ref 0.0–0.9)

## 2021-07-14 ENCOUNTER — Encounter: Payer: Self-pay | Admitting: Nurse Practitioner

## 2021-07-15 ENCOUNTER — Telehealth: Payer: Self-pay

## 2021-07-15 NOTE — Telephone Encounter (Signed)
I have talked with Amiya and she said she only had one req sheet and just drew that lab. All labs were in the Epic system with should have gone to [place] direct.  I can see all the orders in Lab core computer.  I have called the patient Kayla Chaney and she will come back for another lab draw.  I apologized for the miss draw.  She will come back in asap.

## 2021-07-16 ENCOUNTER — Ambulatory Visit: Payer: Self-pay | Admitting: Nurse Practitioner

## 2021-07-23 ENCOUNTER — Other Ambulatory Visit: Payer: Self-pay

## 2021-07-23 ENCOUNTER — Ambulatory Visit: Payer: 59 | Admitting: Nurse Practitioner

## 2021-07-23 ENCOUNTER — Encounter: Payer: Self-pay | Admitting: Nurse Practitioner

## 2021-07-23 ENCOUNTER — Telehealth: Payer: Self-pay

## 2021-07-23 ENCOUNTER — Encounter: Payer: Self-pay | Admitting: Family Medicine

## 2021-07-23 ENCOUNTER — Ambulatory Visit: Payer: 59 | Admitting: Family Medicine

## 2021-07-23 VITALS — BP 114/73 | HR 78 | Resp 17 | Ht 65.5 in | Wt 171.1 lb

## 2021-07-23 DIAGNOSIS — H60392 Other infective otitis externa, left ear: Secondary | ICD-10-CM | POA: Diagnosis not present

## 2021-07-23 DIAGNOSIS — H9202 Otalgia, left ear: Secondary | ICD-10-CM | POA: Diagnosis not present

## 2021-07-23 MED ORDER — AZITHROMYCIN 250 MG PO TABS: ORAL_TABLET | ORAL | 0 refills | Status: AC

## 2021-07-23 MED ORDER — FLUCONAZOLE 150 MG PO TABS: 150.0000 mg | ORAL_TABLET | Freq: Once | ORAL | 0 refills | Status: AC

## 2021-07-23 NOTE — Telephone Encounter (Signed)
Kayla Chaney said Katiria had appt with Kristin Bruins this am but it had to be cancelled. She is scheduled to see Legrand Como next week for her pain and some external swelling but she doesn't want to go through the weekend in pain. Wants prednisone taper sent to [facility]. Please advise (I also requested she send a picture if able)

## 2021-07-23 NOTE — Progress Notes (Signed)
   Kayla Chaney     MRN: [id number]      DOB: [birth year]   HPI Kayla Chaney is here with a 10 day h/o left outer ear pain and swelling, inner aspect. No drainage, fever or chills, no hearing loss New problem, had childhood ear infections  ROS Denies recent fever or chills. Denies sinus pressure, nasal congestion, or sore throat. Denies chest congestion, productive cough or wheezing. . Denies headaches, seizures, numbness, or tingling. Denies depression, anxiety or insomnia. Denies skin break down or rash.   PE  BP 114/73   Pulse 78   Resp 17   Ht 5' 5.5" (1.664 m)   Wt 171 lb 1.9 oz (77.6 kg)   SpO2 94%   BMI 28.04 kg/m   Patient alert and oriented and in no cardiopulmonary distress.  HEENT: No facial asymmetry, EOMI,     Neck supple . Left ear canal mildly erythematous and swollen, both TM clear Chest: Clear to auscultation bilaterally.  CVS: S1, S2 no murmurs, no S3.Regular rate. ABD: Soft non tender.   Ext: No edema  Psych: Good eye contact, normal affect. Memory intact not anxious or depressed appearing.  CNS: CN 2-12 intact, power,  normal throughout.no focal deficits noted.   Assessment & Plan  Acute otalgia, left Z pack prescribed, cal if persists or worsens  Otitis externa, left Z pack and fluconazole if needed, prescribed

## 2021-07-23 NOTE — Telephone Encounter (Signed)
Appt was scheduled for 11:00 today

## 2021-07-23 NOTE — Patient Instructions (Addendum)
F/U in mid to end Jan with Kristin Bruins, call if you need [place] before  Z pack is prescribed for left ear , and fluconazle 1 only if needed for vaginal yeast infection  Please do get Covid booster   Thanks for choosing [facility], we consider it a privelige to serve you.

## 2021-07-25 ENCOUNTER — Encounter: Payer: Self-pay | Admitting: Family Medicine

## 2021-07-25 DIAGNOSIS — H6092 Unspecified otitis externa, left ear: Secondary | ICD-10-CM | POA: Insufficient documentation

## 2021-07-25 MED FILL — Estradiol Tab 0.5 MG: ORAL | 30 days supply | Qty: 30 | Fill #7 | Status: AC

## 2021-07-25 NOTE — Assessment & Plan Note (Signed)
Z pack prescribed, cal if persists or worsens

## 2021-07-25 NOTE — Assessment & Plan Note (Signed)
Z pack and fluconazole if needed, prescribed

## 2021-07-26 ENCOUNTER — Other Ambulatory Visit (HOSPITAL_COMMUNITY): Payer: Self-pay

## 2021-07-29 ENCOUNTER — Ambulatory Visit: Payer: 59 | Admitting: Nurse Practitioner

## 2021-07-30 ENCOUNTER — Encounter: Payer: Self-pay | Admitting: Obstetrics & Gynecology

## 2021-08-07 ENCOUNTER — Other Ambulatory Visit (HOSPITAL_COMMUNITY): Payer: Self-pay

## 2021-08-07 MED FILL — Valacyclovir HCl Tab 1 GM: ORAL | 8 days supply | Qty: 30 | Fill #1 | Status: AC

## 2021-08-24 ENCOUNTER — Other Ambulatory Visit (HOSPITAL_COMMUNITY): Payer: Self-pay

## 2021-08-27 ENCOUNTER — Encounter: Payer: Self-pay | Admitting: Obstetrics & Gynecology

## 2021-08-27 ENCOUNTER — Ambulatory Visit (INDEPENDENT_AMBULATORY_CARE_PROVIDER_SITE_OTHER): Payer: 59 | Admitting: Obstetrics & Gynecology

## 2021-08-27 ENCOUNTER — Other Ambulatory Visit: Payer: Self-pay

## 2021-08-27 ENCOUNTER — Other Ambulatory Visit (HOSPITAL_COMMUNITY): Payer: Self-pay

## 2021-08-27 VITALS — BP 112/74 | HR 72 | Ht 65.5 in | Wt 169.8 lb

## 2021-08-27 DIAGNOSIS — Z79890 Hormone replacement therapy (postmenopausal): Secondary | ICD-10-CM | POA: Diagnosis not present

## 2021-08-27 DIAGNOSIS — N951 Menopausal and female climacteric states: Secondary | ICD-10-CM | POA: Diagnosis not present

## 2021-08-27 MED ORDER — ESTRADIOL 0.5 MG PO TABS
ORAL_TABLET | Freq: Every day | ORAL | 4 refills | Status: DC
  Filled 2021-08-27: qty 90, 90d supply, fill #0
  Filled 2021-11-19: qty 90, 90d supply, fill #1
  Filled 2022-02-03: qty 90, 90d supply, fill #2
  Filled 2022-04-29: qty 90, 90d supply, fill #3
  Filled 2022-08-02: qty 90, 90d supply, fill #4

## 2021-08-27 NOTE — Progress Notes (Signed)
° °  GYN VISIT Patient name: Kayla Chaney MRN [id number]  Date of birth: 01-Jul-1976 Chief Complaint:   New Patient (Initial Visit) and Medication Refill  History of Present Illness:   Kayla Chaney is a 45 y.o. [phone] perimenopausal female being seen today for the following concerns:  HRT management: Previously seen by Dr. Marga Melnick; however, he moved.  She had reported considerable vasomotor symptoms and has been on estradiol with great improvement of her symptoms.  Currently rates symptoms 2/10.  She has been doing well and wishes to continue.    Pt has Mirena for progesterone- no period.  Not sure when placed but less than 8 yrs.  Placed with Dr. Corinna Capra   Otherwise reports no acute complaints- denies vaginal discharge, itching or irritation.  Denies irregular bleeding. Denies pelvic or abdominal pain.  No LMP recorded (lmp unknown). (Menstrual status: IUD).  Depression screen [facility] 2/9 08/27/2021 07/23/2021 07/09/2021 07/03/2020 11/01/2019  Decreased Interest 0 0 0 0 0  Down, Depressed, Hopeless 0 0 0 0 0  PHQ - 2 Score 0 0 0 0 0  Altered sleeping 1 0 - - -  Tired, decreased energy 0 0 - - -  Change in appetite 0 0 - - -  Feeling bad or failure about yourself  0 0 - - -  Trouble concentrating 0 0 - - -  Moving slowly or fidgety/restless 0 0 - - -  Suicidal thoughts 0 0 - - -  PHQ-9 Score 1 0 - - -     Review of Systems:   Pertinent items are noted in HPI Denies fever/chills, dizziness, headaches, visual disturbances, fatigue, shortness of breath, chest pain, abdominal pain, vomiting, no problems with periods, bowel movements, urination, or intercourse unless otherwise stated above.  Pertinent History Reviewed:  Reviewed past medical,surgical, social, obstetrical and family history.  Reviewed problem list, medications and allergies. Physical Assessment:   Vitals:   08/27/21 1140  BP: 112/74  Pulse: 72  Weight: 169 lb 12.8 oz (77 kg)  Height: 5' 5.5" (1.664 m)  Body mass index  is 27.83 kg/m.       Physical Examination:   General appearance: alert, well appearing, and in no distress  Psych: mood appropriate, normal affect  Skin: warm & dry   Cardiovascular: RRR  Respiratory: normal respiratory effort, no distress, CTAB  Abdomen: soft, non-tender, no rebound, no guarding  Pelvic: examination not indicated  Extremities: no edema   Chaperone: N/A    Assessment & Plan:  1) Perimenopausal vasomotor symptoms -doing well with current meds, plan to continue []  obtain records regarding Mirena  Discussed [facility] and reviewed risk of estrogen including risk of VTE, stroke or MI.  Pt stays up to date with mammograms.  Meds ordered this encounter  Medications   estradiol (ESTRACE) 0.5 MG tablet    Sig: TAKE 1 TABLET BY MOUTH ONCE A DAY    Dispense:  90 tablet    Refill:  4     Return in about 1 year (around 08/27/2022) for Annual. []  med release for Physician for [facility].   Janyth Pupa, DO Attending [place], [facility] for [employer], [place]

## 2021-09-01 ENCOUNTER — Telehealth: Payer: Self-pay | Admitting: *Deleted

## 2021-09-01 NOTE — Telephone Encounter (Signed)
LMOVM Mirena IUD was placed 07/27/2015 at [facility].

## 2021-09-03 ENCOUNTER — Other Ambulatory Visit (HOSPITAL_COMMUNITY): Payer: Self-pay

## 2021-09-22 ENCOUNTER — Other Ambulatory Visit (HOSPITAL_COMMUNITY): Payer: Self-pay

## 2021-09-27 ENCOUNTER — Encounter: Payer: Self-pay | Admitting: Orthopedic Surgery

## 2021-09-28 ENCOUNTER — Other Ambulatory Visit (HOSPITAL_COMMUNITY): Payer: Self-pay

## 2021-09-28 MED ORDER — PREDNISONE 10 MG PO TABS
10.0000 mg | ORAL_TABLET | Freq: Three times a day (TID) | ORAL | 5 refills | Status: DC
  Filled 2021-09-28: qty 60, 20d supply, fill #0

## 2021-09-29 ENCOUNTER — Other Ambulatory Visit: Payer: Self-pay

## 2021-09-29 ENCOUNTER — Ambulatory Visit: Payer: No Typology Code available for payment source | Admitting: Orthopedic Surgery

## 2021-09-29 DIAGNOSIS — M76821 Posterior tibial tendinitis, right leg: Secondary | ICD-10-CM

## 2021-09-29 DIAGNOSIS — S86111A Strain of other muscle(s) and tendon(s) of posterior muscle group at lower leg level, right leg, initial encounter: Secondary | ICD-10-CM | POA: Diagnosis not present

## 2021-09-29 DIAGNOSIS — M76829 Posterior tibial tendinitis, unspecified leg: Secondary | ICD-10-CM

## 2021-09-29 NOTE — Progress Notes (Signed)
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  She felt pain along the medial side of the ankle and approximately 1/3-1/4 up the medial tibia  She has difficulty weightbearing  Exam shows that she is ambulatory in a cam walker limping.  She has severe tenderness along the posterior tibial tendon and up to the distal 3rd-1/4 of the tibia just posterior to the bone.  The Achilles tendon itself is intact  There is no palpable defect  There is pain to resisted inversion and when manual muscle testing and posterior tibial tendon however there is minimal weakness there.  Recommend continue NSAIDs but no steroids  Supportive walking with tall cam walker  Encounter Diagnoses  Name Primary?   PTTD (posterior tibial tendon dysfunction) Yes   Traumatic rupture of right posterior tibial tendon, initial encounter

## 2021-10-05 ENCOUNTER — Other Ambulatory Visit (HOSPITAL_COMMUNITY): Payer: Self-pay

## 2021-10-05 ENCOUNTER — Encounter: Payer: Self-pay | Admitting: Orthopedic Surgery

## 2021-10-05 ENCOUNTER — Other Ambulatory Visit: Payer: Self-pay | Admitting: Orthopedic Surgery

## 2021-10-05 DIAGNOSIS — S86111A Strain of other muscle(s) and tendon(s) of posterior muscle group at lower leg level, right leg, initial encounter: Secondary | ICD-10-CM

## 2021-10-05 NOTE — Telephone Encounter (Signed)
Ok to proceed. 

## 2021-10-05 NOTE — Progress Notes (Unsigned)
46 yo female   Felt a pop in the ankle during a zumba class  Initial rx was with a boot   Nsaids  Activity modification  She is still having significant pain   Rec mri

## 2021-10-08 ENCOUNTER — Ambulatory Visit (HOSPITAL_COMMUNITY)
Admission: RE | Admit: 2021-10-08 | Discharge: 2021-10-08 | Disposition: A | Discharge status: Home / Self Care | Payer: No Typology Code available for payment source | Source: Ambulatory Visit | Attending: Orthopedic Surgery | Admitting: Orthopedic Surgery

## 2021-10-08 ENCOUNTER — Other Ambulatory Visit: Payer: Self-pay

## 2021-10-08 DIAGNOSIS — S86111A Strain of other muscle(s) and tendon(s) of posterior muscle group at lower leg level, right leg, initial encounter: Secondary | ICD-10-CM | POA: Diagnosis present

## 2021-10-11 ENCOUNTER — Other Ambulatory Visit: Payer: Self-pay

## 2021-10-11 ENCOUNTER — Ambulatory Visit: Payer: No Typology Code available for payment source | Admitting: Orthopedic Surgery

## 2021-10-11 DIAGNOSIS — M76821 Posterior tibial tendinitis, right leg: Secondary | ICD-10-CM

## 2021-10-11 MED ORDER — DICLOFENAC EPOLAMINE 1.3 % EX PTCH: 1.0000 | MEDICATED_PATCH | Freq: Two times a day (BID) | CUTANEOUS | Status: AC

## 2021-10-12 ENCOUNTER — Encounter: Payer: Self-pay | Admitting: Orthopedic Surgery

## 2021-10-12 NOTE — Progress Notes (Signed)
Office Visit Note   Patient: Kayla Chaney           Date of Birth: [birth year]           MRN: [id number] Visit Date: 10/11/2021              Requested by: Renee Rival, [address] [place],  [place] [id number] PCP: Renee Rival, FNP  Chief Complaint  Patient presents with   Right Ankle - Injury      HPI: Patient is a 46 year old woman who is seen for initial evaluation for posterior tibial tendon insufficiency on the right.  Patient has had pain for over 6 months she has been in a cam walker boot for 2 weeks still with persistent pain.  Patient states that the initial injury occurred during Zumba class.  She has had an MRI scan February 10.  She states she was doing high knee raises when she felt her ankle pop.  Assessment & Plan: Visit Diagnoses:  1. Insufficiency of right posterior tibial tendon     Plan: With patient's posterior tibial tendon insufficiency and significant deformity of the hindfoot I feel her best option would be to proceed with a talonavicular and subtalar fusion.  We will plan for surgery as an outpatient at [facility] operating room.  Patient is provided a prescription for Flector patch at this time.  Risks and benefits were discussed including time of healing and potential for additional surgery.  Patient states she understands wished to proceed at this time.  Follow-Up Instructions: Return in about 4 weeks (around 11/08/2021).   Ortho Exam  Patient is alert, oriented, no adenopathy, well-dressed, normal affect, normal respiratory effort. Examination patient has a good dorsalis pedis and posterior tibial pulse.  She has a pronated forefoot valgus hindfoot with pes planus on the right normal anatomy on the left she has a positive too many toes sign.  She cannot do a single limb heel raise she is tender to palpation in the sinus Tarsi from the lateral impingement from the pronation and valgus of her foot.  She has pain to  palpation of the posterior tibial tendon.  Review of the MRI scan shows partial-thickness tearing of the posterior tibial tendon with focal tendinosis just proximal to the navicular.  Imaging: No results found. No images are attached to the encounter.  Labs: Lab Results  Component Value Date   LABORGA ESCHERICHIA COLI 10/28/2014     Lab Results  Component Value Date   ALBUMIN 4.4 07/03/2020   ALBUMIN 4.5 08/13/2019   ALBUMIN 4.5 01/25/2019    No results found for: MG Lab Results  Component Value Date   VD25OH 38.5 01/25/2019    No results found for: PREALBUMIN CBC EXTENDED Latest Ref Rng & Units 08/13/2019  WBC 3.4 - 10.8 x10E3/uL 4.7  RBC 3.77 - 5.28 x10E6/uL 4.30  HGB 11.1 - 15.9 g/dL 14.2  HCT 34.0 - 46.6 % 40.5  PLT 150 - 450 x10E3/uL 203     There is no height or weight on file to calculate BMI.  Orders:  No orders of the defined types were placed in this encounter.  Meds ordered this encounter  Medications   diclofenac (FLECTOR) 1.3 % 1 patch     Procedures: No procedures performed  Clinical Data: No additional findings.  ROS:  All other systems negative, except as noted in the HPI. Review of Systems  Objective: Vital Signs: There were no vitals  taken for this visit.  Specialty Comments:  No specialty comments available.  PMFS History: Patient Active Problem List   Diagnosis Date Noted   Otitis externa, left 07/25/2021   Acute otalgia, left 07/23/2021   Palpitations 07/09/2021   Hot flashes due to menopause 07/09/2021   IUD (intrauterine device) in place 07/09/2021   Hyperlipemia 07/09/2021   Insomnia 11/13/2013   Degenerative joint disease of cervical spine 11/13/2013   Past Medical History:  Diagnosis Date   Hot flashes due to menopause 07/09/2021   Hyperlipidemia    IUD (intrauterine device) in place 07/09/2021    Family History  Problem Relation Age of Onset   Stroke Mother    Hypertension Father    Diabetes Father     Brain cancer Sister    Stroke Maternal Grandmother     Past Surgical History:  Procedure Laterality Date   CESAREAN SECTION  2003, 2008   COLONOSCOPY WITH PROPOFOL N/A 11/20/2020   Procedure: COLONOSCOPY WITH PROPOFOL;  Surgeon: Eloise Harman, DO;  [facility];  Service: Endoscopy;  Laterality: N/A;  am/ASA II, pt tested +1/24 thru [employer] at work <90 - results emailed to [place] - advised pt current arrival time of 7:00 - needed to know for transportation cy 3/21   [place] N/A 2008   Retained placenta from c-section birth   POLYPECTOMY  11/20/2020   Procedure: POLYPECTOMY;  Surgeon: Eloise Harman, DO;  Location: [facility];  Service: Endoscopy;;   Social History   Occupational History    Comment: XRAY tech  Tobacco Use   Smoking status: Never   Smokeless tobacco: Never  Vaping Use   Vaping Use: Never used  Substance and Sexual Activity   Alcohol use: Yes    Comment: occ   Drug use: No   Sexual activity: Yes    Birth control/protection: I.U.D.

## 2021-10-25 ENCOUNTER — Encounter (HOSPITAL_COMMUNITY): Payer: Self-pay | Admitting: Orthopedic Surgery

## 2021-10-25 ENCOUNTER — Other Ambulatory Visit: Payer: Self-pay

## 2021-10-25 NOTE — Progress Notes (Signed)
Spoke with pt for pre-op call. Pt denies hx of HTN, Diabetes and only cardiac history is she has palpitations at times, was told to decrease caffeine intake.  Pt's surgery is scheduled as ambulatory so no Covid test is required prior to surgery.

## 2021-10-26 ENCOUNTER — Encounter (HOSPITAL_COMMUNITY): Payer: Self-pay | Admitting: Orthopedic Surgery

## 2021-10-26 ENCOUNTER — Other Ambulatory Visit: Payer: Self-pay | Admitting: Family

## 2021-10-26 NOTE — Anesthesia Preprocedure Evaluation (Addendum)
Anesthesia Evaluation  Patient identified by MRN, date of birth, ID band Patient awake    Reviewed: Allergy & Precautions, NPO status , Patient's Chart, lab work & pertinent test results  Airway Mallampati: I  TM Distance: >3 FB Neck ROM: Full    Dental  (+) Teeth Intact, Dental Advisory Given   Pulmonary neg pulmonary ROS,    breath sounds clear to auscultation       Cardiovascular negative cardio ROS   Rhythm:Regular Rate:Normal     Neuro/Psych negative neurological ROS  negative psych ROS   GI/Hepatic negative GI ROS, Neg liver ROS,   Endo/Other  negative endocrine ROS  Renal/GU negative Renal ROS  negative genitourinary   Musculoskeletal  (+) Arthritis , Osteoarthritis,    Abdominal Normal abdominal exam  (+)   Peds negative pediatric ROS (+)  Hematology negative hematology ROS (+)   Anesthesia Other Findings   Reproductive/Obstetrics negative OB ROS                            Anesthesia Physical Anesthesia Plan  ASA: 2  Anesthesia Plan: Regional and General   Post-op Pain Management: Regional block* and Tylenol PO (pre-op)*   Induction: Intravenous  PONV Risk Score and Plan: 3 and Treatment may vary due to age or medical condition, Scopolamine patch - Pre-op, Midazolam, Ondansetron and Dexamethasone  Airway Management Planned: LMA  Additional Equipment: None  Intra-op Plan:   Post-operative Plan: Extubation in OR  Informed Consent: I have reviewed the patients History and Physical, chart, labs and discussed the procedure including the risks, benefits and alternatives for the proposed anesthesia with the patient or authorized representative who has indicated his/her understanding and acceptance.       Plan Discussed with: CRNA  Anesthesia Plan Comments: (Popliteal block for pain control. GA/LMA. Norton Blizzard, MD  )       Anesthesia Quick Evaluation

## 2021-10-27 ENCOUNTER — Encounter (HOSPITAL_COMMUNITY)
Admission: RE | Disposition: A | Discharge status: Home / Self Care | Payer: Self-pay | Source: Home / Self Care | Attending: Orthopedic Surgery

## 2021-10-27 ENCOUNTER — Other Ambulatory Visit (HOSPITAL_COMMUNITY): Payer: Self-pay

## 2021-10-27 ENCOUNTER — Other Ambulatory Visit: Payer: Self-pay

## 2021-10-27 ENCOUNTER — Ambulatory Visit (HOSPITAL_COMMUNITY): Payer: No Typology Code available for payment source | Admitting: Certified Registered Nurse Anesthetist

## 2021-10-27 ENCOUNTER — Ambulatory Visit (HOSPITAL_BASED_OUTPATIENT_CLINIC_OR_DEPARTMENT_OTHER): Payer: No Typology Code available for payment source | Admitting: Certified Registered Nurse Anesthetist

## 2021-10-27 ENCOUNTER — Encounter (HOSPITAL_COMMUNITY): Payer: Self-pay | Admitting: Orthopedic Surgery

## 2021-10-27 ENCOUNTER — Encounter: Payer: Self-pay | Admitting: Orthopedic Surgery

## 2021-10-27 ENCOUNTER — Ambulatory Visit (HOSPITAL_COMMUNITY)
Admission: RE | Admit: 2021-10-27 | Discharge: 2021-10-27 | Disposition: A | Discharge status: Home / Self Care | Payer: No Typology Code available for payment source | Attending: Orthopedic Surgery | Admitting: Orthopedic Surgery

## 2021-10-27 DIAGNOSIS — M76822 Posterior tibial tendinitis, left leg: Secondary | ICD-10-CM

## 2021-10-27 DIAGNOSIS — M67961 Unspecified disorder of synovium and tendon, right lower leg: Secondary | ICD-10-CM

## 2021-10-27 DIAGNOSIS — Y9349 Activity, other involving dancing and other rhythmic movements: Secondary | ICD-10-CM | POA: Diagnosis not present

## 2021-10-27 DIAGNOSIS — S96801A Unspecified injury of other specified muscles and tendons at ankle and foot level, right foot, initial encounter: Secondary | ICD-10-CM | POA: Diagnosis present

## 2021-10-27 DIAGNOSIS — M76829 Posterior tibial tendinitis, unspecified leg: Secondary | ICD-10-CM

## 2021-10-27 HISTORY — PX: FOOT ARTHRODESIS: SHX1655

## 2021-10-27 LAB — CBC
HCT: 36.7 % (ref 36.0–46.0)
Hemoglobin: 12.6 g/dL (ref 12.0–15.0)
MCH: 31.4 pg (ref 26.0–34.0)
MCHC: 34.3 g/dL (ref 30.0–36.0)
MCV: 91.5 fL (ref 80.0–100.0)
Platelets: 245 10*3/uL (ref 150–400)
RBC: 4.01 MIL/uL (ref 3.87–5.11)
RDW: 11.7 % (ref 11.5–15.5)
WBC: 5.7 10*3/uL (ref 4.0–10.5)
nRBC: 0 % (ref 0.0–0.2)

## 2021-10-27 LAB — POCT PREGNANCY, URINE: Preg Test, Ur: NEGATIVE

## 2021-10-27 SURGERY — FUSION, JOINT, FOOT
Anesthesia: Regional | Site: Foot | Laterality: Right

## 2021-10-27 MED ORDER — ONDANSETRON HCL 4 MG/2ML IJ SOLN
INTRAMUSCULAR | Status: DC | PRN
  Administered 2021-10-27: 4 mg via INTRAVENOUS

## 2021-10-27 MED ORDER — AMISULPRIDE (ANTIEMETIC) 5 MG/2ML IV SOLN: 10.0000 mg | Freq: Once | INTRAVENOUS | Status: DC | PRN

## 2021-10-27 MED ORDER — LIDOCAINE 2% (20 MG/ML) 5 ML SYRINGE
INTRAMUSCULAR | Status: DC | PRN
  Administered 2021-10-27: 80 mg via INTRAVENOUS

## 2021-10-27 MED ORDER — OXYCODONE HCL 5 MG/5ML PO SOLN: 5.0000 mg | Freq: Once | ORAL | Status: DC | PRN

## 2021-10-27 MED ORDER — PHENYLEPHRINE 40 MCG/ML (10ML) SYRINGE FOR IV PUSH (FOR BLOOD PRESSURE SUPPORT)
PREFILLED_SYRINGE | INTRAVENOUS | Status: DC | PRN
  Administered 2021-10-27: 40 ug via INTRAVENOUS
  Administered 2021-10-27 (×3): 80 ug via INTRAVENOUS

## 2021-10-27 MED ORDER — ORAL CARE MOUTH RINSE: 15.0000 mL | Freq: Once | OROMUCOSAL | Status: AC

## 2021-10-27 MED ORDER — MIDAZOLAM HCL 2 MG/2ML IJ SOLN
INTRAMUSCULAR | Status: DC | PRN
Start: 2021-10-27 — End: 2021-10-27
  Administered 2021-10-27: 2 mg via INTRAVENOUS

## 2021-10-27 MED ORDER — CEFAZOLIN SODIUM-DEXTROSE 2-4 GM/100ML-% IV SOLN
2.0000 g | INTRAVENOUS | Status: AC
  Administered 2021-10-27: 2 g via INTRAVENOUS
  Filled 2021-10-27: qty 100

## 2021-10-27 MED ORDER — SCOPOLAMINE 1 MG/3DAYS TD PT72
1.0000 | MEDICATED_PATCH | TRANSDERMAL | Status: DC
  Administered 2021-10-27: 1.5 mg via TRANSDERMAL
  Filled 2021-10-27: qty 1

## 2021-10-27 MED ORDER — EPHEDRINE SULFATE-NACL 50-0.9 MG/10ML-% IV SOSY
PREFILLED_SYRINGE | INTRAVENOUS | Status: DC | PRN
  Administered 2021-10-27 (×5): 5 mg via INTRAVENOUS

## 2021-10-27 MED ORDER — ONDANSETRON HCL 4 MG/2ML IJ SOLN: 4.0000 mg | Freq: Once | INTRAMUSCULAR | Status: DC | PRN

## 2021-10-27 MED ORDER — FENTANYL CITRATE (PF) 100 MCG/2ML IJ SOLN: 25.0000 ug | INTRAMUSCULAR | Status: DC | PRN

## 2021-10-27 MED ORDER — ONDANSETRON HCL 4 MG/2ML IJ SOLN
INTRAMUSCULAR | Status: AC
  Filled 2021-10-27: qty 2

## 2021-10-27 MED ORDER — LACTATED RINGERS IV SOLN: INTRAVENOUS | Status: DC

## 2021-10-27 MED ORDER — FENTANYL CITRATE (PF) 250 MCG/5ML IJ SOLN
INTRAMUSCULAR | Status: AC
  Filled 2021-10-27: qty 5

## 2021-10-27 MED ORDER — FENTANYL CITRATE (PF) 250 MCG/5ML IJ SOLN
INTRAMUSCULAR | Status: DC | PRN
  Administered 2021-10-27 (×2): 50 ug via INTRAVENOUS

## 2021-10-27 MED ORDER — PROMETHAZINE HCL 12.5 MG PO TABS
12.5000 mg | ORAL_TABLET | Freq: Four times a day (QID) | ORAL | 0 refills | Status: DC | PRN
  Filled 2021-10-27: qty 30, 8d supply, fill #0

## 2021-10-27 MED ORDER — PROPOFOL 10 MG/ML IV BOLUS
INTRAVENOUS | Status: AC
  Filled 2021-10-27: qty 20

## 2021-10-27 MED ORDER — CHLORHEXIDINE GLUCONATE 0.12 % MT SOLN
15.0000 mL | Freq: Once | OROMUCOSAL | Status: AC
  Administered 2021-10-27: 15 mL via OROMUCOSAL
  Filled 2021-10-27: qty 15

## 2021-10-27 MED ORDER — ROPIVACAINE HCL 5 MG/ML IJ SOLN
INTRAMUSCULAR | Status: DC | PRN
  Administered 2021-10-27: 25 mL via PERINEURAL
  Administered 2021-10-27: 15 mL via PERINEURAL

## 2021-10-27 MED ORDER — PHENYLEPHRINE HCL-NACL 20-0.9 MG/250ML-% IV SOLN
INTRAVENOUS | Status: DC | PRN
Start: 2021-10-27 — End: 2021-10-27

## 2021-10-27 MED ORDER — OXYCODONE-ACETAMINOPHEN 5-325 MG PO TABS
1.0000 | ORAL_TABLET | ORAL | 0 refills | Status: DC | PRN
  Filled 2021-10-27: qty 30, 5d supply, fill #0

## 2021-10-27 MED ORDER — AMISULPRIDE (ANTIEMETIC) 5 MG/2ML IV SOLN
10.0000 mg | Freq: Once | INTRAVENOUS | Status: AC
  Administered 2021-10-27: 10 mg via INTRAVENOUS

## 2021-10-27 MED ORDER — PROPOFOL 10 MG/ML IV BOLUS
INTRAVENOUS | Status: DC | PRN
  Administered 2021-10-27: 150 mg via INTRAVENOUS
  Administered 2021-10-27: 50 mg via INTRAVENOUS

## 2021-10-27 MED ORDER — MIDAZOLAM HCL 2 MG/2ML IJ SOLN
INTRAMUSCULAR | Status: AC
  Filled 2021-10-27: qty 2

## 2021-10-27 MED ORDER — 0.9 % SODIUM CHLORIDE (POUR BTL) OPTIME
TOPICAL | Status: DC | PRN
Start: 2021-10-27 — End: 2021-10-27
  Administered 2021-10-27: 1000 mL

## 2021-10-27 MED ORDER — OXYCODONE HCL 5 MG PO TABS: 5.0000 mg | ORAL_TABLET | Freq: Once | ORAL | Status: DC | PRN

## 2021-10-27 MED ORDER — DEXAMETHASONE SODIUM PHOSPHATE 10 MG/ML IJ SOLN
INTRAMUSCULAR | Status: DC | PRN
  Administered 2021-10-27: 10 mg via INTRAVENOUS

## 2021-10-27 MED ORDER — ONDANSETRON HCL 4 MG/2ML IJ SOLN
4.0000 mg | Freq: Once | INTRAMUSCULAR | Status: AC
  Administered 2021-10-27: 4 mg via INTRAVENOUS

## 2021-10-27 MED ORDER — AMISULPRIDE (ANTIEMETIC) 5 MG/2ML IV SOLN
INTRAVENOUS | Status: AC
  Filled 2021-10-27: qty 4

## 2021-10-27 MED ORDER — ACETAMINOPHEN 500 MG PO TABS
1000.0000 mg | ORAL_TABLET | Freq: Once | ORAL | Status: AC
  Administered 2021-10-27: 1000 mg via ORAL
  Filled 2021-10-27: qty 2

## 2021-10-27 SURGICAL SUPPLY — 53 items
BAG COUNTER SPONGE SURGICOUNT (BAG) ×3 IMPLANT
BAG SPNG CNTER NS LX DISP (BAG) ×1
BANDAGE ESMARK 6X9 LF (GAUZE/BANDAGES/DRESSINGS) IMPLANT
BIT DRILL 3.0 6IN (DRILL) IMPLANT
BIT DRILL 4.5 TYB 9 (BIT) ×1 IMPLANT
BLADE SAW SGTL HD 18.5X60.5X1. (BLADE) ×3 IMPLANT
BLADE SURG 10 STRL SS (BLADE) IMPLANT
BNDG CMPR 9X6 STRL LF SNTH (GAUZE/BANDAGES/DRESSINGS)
BNDG COHESIVE 4X5 TAN STRL (GAUZE/BANDAGES/DRESSINGS) ×3 IMPLANT
BNDG COHESIVE 6X5 TAN NS LF (GAUZE/BANDAGES/DRESSINGS) ×1 IMPLANT
BNDG ESMARK 6X9 LF (GAUZE/BANDAGES/DRESSINGS)
BNDG GAUZE ELAST 4 BULKY (GAUZE/BANDAGES/DRESSINGS) ×5 IMPLANT
COTTON STERILE ROLL (GAUZE/BANDAGES/DRESSINGS) ×3 IMPLANT
COVER MAYO STAND STRL (DRAPES) IMPLANT
COVER SURGICAL LIGHT HANDLE (MISCELLANEOUS) ×6 IMPLANT
DRAPE INCISE IOBAN 66X45 STRL (DRAPES) ×3 IMPLANT
DRAPE OEC MINIVIEW 54X84 (DRAPES) IMPLANT
DRAPE U-SHAPE 47X51 STRL (DRAPES) ×3 IMPLANT
DRILL 3.0 6IN (DRILL)
DRSG ADAPTIC 3X8 NADH LF (GAUZE/BANDAGES/DRESSINGS) ×3 IMPLANT
DURAPREP 26ML APPLICATOR (WOUND CARE) ×3 IMPLANT
ELECT REM PT RETURN 9FT ADLT (ELECTROSURGICAL) ×2
ELECTRODE REM PT RTRN 9FT ADLT (ELECTROSURGICAL) ×2 IMPLANT
GAUZE SPONGE 4X4 12PLY STRL (GAUZE/BANDAGES/DRESSINGS) ×3 IMPLANT
GAUZE SPONGE 4X4 12PLY STRL LF (GAUZE/BANDAGES/DRESSINGS) ×1 IMPLANT
GLOVE SURG ORTHO LTX SZ9 (GLOVE) ×3 IMPLANT
GLOVE SURG UNDER POLY LF SZ9 (GLOVE) ×3 IMPLANT
GOWN STRL REUS W/ TWL XL LVL3 (GOWN DISPOSABLE) ×6 IMPLANT
GOWN STRL REUS W/TWL XL LVL3 (GOWN DISPOSABLE) ×6
GRAFT SKIN WND MICRO 38 (Tissue) ×1 IMPLANT
GUIDEWIRE TYB 2X9 (WIRE) ×2 IMPLANT
KIT BASIN OR (CUSTOM PROCEDURE TRAY) ×3 IMPLANT
KIT STAPLE ARCUS 16X13 STRL (Staple) ×1 IMPLANT
KIT STAPLE ARCUS 20X17 STRL (Staple) ×1 IMPLANT
KIT TURNOVER KIT B (KITS) ×3 IMPLANT
MANIFOLD NEPTUNE II (INSTRUMENTS) ×3 IMPLANT
NS IRRIG 1000ML POUR BTL (IV SOLUTION) ×3 IMPLANT
PACK ORTHO EXTREMITY (CUSTOM PROCEDURE TRAY) ×3 IMPLANT
PAD ABD 8X10 STRL (GAUZE/BANDAGES/DRESSINGS) ×1 IMPLANT
PAD ARMBOARD 7.5X6 YLW CONV (MISCELLANEOUS) ×6 IMPLANT
PAD CAST 4YDX4 CTTN HI CHSV (CAST SUPPLIES) ×2 IMPLANT
PADDING CAST COTTON 4X4 STRL (CAST SUPPLIES) ×2
PUTTY DBM STAGRAFT PLUS 5CC (Putty) ×1 IMPLANT
SCREW CANN HDLS SHRT 6.5X75 (Screw) ×1 IMPLANT
SCREW CANN HDLS SHT 6.5X65 (Screw) ×1 IMPLANT
SPONGE T-LAP 18X18 ~~LOC~~+RFID (SPONGE) ×3 IMPLANT
SUCTION FRAZIER HANDLE 10FR (MISCELLANEOUS) ×2
SUCTION TUBE FRAZIER 10FR DISP (MISCELLANEOUS) ×2 IMPLANT
SUT ETHILON 2 0 PSLX (SUTURE) ×9 IMPLANT
TOWEL GREEN STERILE (TOWEL DISPOSABLE) ×3 IMPLANT
TOWEL GREEN STERILE FF (TOWEL DISPOSABLE) ×3 IMPLANT
TUBE CONNECTING 12X1/4 (SUCTIONS) ×3 IMPLANT
WATER STERILE IRR 1000ML POUR (IV SOLUTION) ×3 IMPLANT

## 2021-10-27 NOTE — Anesthesia Procedure Notes (Signed)
Procedure Name: LMA Insertion ?Date/Time: 10/27/2021 8:31 AM ?Performed by: Carolan Clines, CRNA ?Pre-anesthesia Checklist: Patient identified, Emergency Drugs available, Suction available and Patient being monitored ?Patient Re-evaluated:Patient Re-evaluated prior to induction ?Oxygen Delivery Method: Circle System Utilized ?Preoxygenation: Pre-oxygenation with 100% oxygen ?Induction Type: IV induction ?Ventilation: Mask ventilation without difficulty ?LMA: LMA inserted ?LMA Size: 4.0 ?Number of attempts: 1 ?Airway Equipment and Method: Bite block ?Placement Confirmation: positive ETCO2 and breath sounds checked- equal and bilateral ?Tube secured with: Tape ?Dental Injury: Teeth and Oropharynx as per pre-operative assessment  ? ? ? ? ?

## 2021-10-27 NOTE — Anesthesia Postprocedure Evaluation (Signed)
Anesthesia Post Note ? ?Patient: Kayla Chaney ? ?Procedure(s) Performed: RIGHT SUBTALAR AND TALONAVICULAR FUSION (Right: Foot) ? ?  ? ?Patient location during evaluation: PACU ?Anesthesia Type: Regional and General ?Level of consciousness: awake ?Pain management: pain level controlled ?Vital Signs Assessment: post-procedure vital signs reviewed and stable ?Respiratory status: spontaneous breathing and respiratory function stable ?Cardiovascular status: stable ?Postop Assessment: no apparent nausea or vomiting ?Anesthetic complications: no ? ? ?No notable events documented. ? ?Last Vitals:  ?Vitals:  ? 10/27/21 1028 10/27/21 1045  ?BP: 121/60 115/62  ?Pulse: 83 77  ?Resp: 16 16  ?Temp:  36.6 ?C  ?SpO2: 96% 98%  ?  ?Last Pain:  ?Vitals:  ? 10/27/21 1045  ?TempSrc:   ?PainSc: 0-No pain  ? ? ?  ?  ?  ?  ?  ?  ? ?Merlinda Frederick ? ? ? ? ?

## 2021-10-27 NOTE — Progress Notes (Signed)
Orthopedic Tech Progress Note ?Patient Details:  ?Kayla Chaney ?14-Mar-1976 ?[id number] ? ?Ortho Devices ?Type of Ortho Device: CAM walker ?Ortho Device/Splint Location: RLE ?Ortho Device/Splint Interventions: Ordered, Application, Adjustment ?  ?Post Interventions ?Patient Tolerated: Well ?Instructions Provided: Adjustment of device, Care of device ? ?Vernona Rieger ?10/27/2021, 11:30 AM ? ?

## 2021-10-27 NOTE — Anesthesia Procedure Notes (Signed)
Anesthesia Regional Block: Adductor canal block  ? ?Pre-Anesthetic Checklist: , timeout performed,  Correct Patient, Correct Site, Correct Laterality,  Correct Procedure, Correct Position, site marked,  Risks and benefits discussed,  Surgical consent,  Pre-op evaluation,  At surgeon's request and post-op pain management ? ?Laterality: Right ? ?Prep: chloraprep     ?  ?Needles:  ?Injection technique: Single-shot ? ?Needle Type: Echogenic Stimulator Needle   ? ? ?Needle Length: 9cm  ?Needle Gauge: 21  ? ? ? ?Additional Needles: ? ? ?Procedures:,,,, ultrasound used (permanent image in chart),,    ?Narrative:  ?Start time: 10/27/2021 7:45 AM ?End time: 10/27/2021 7:50 AM ?Injection made incrementally with aspirations every 5 mL. ? ?Performed by: Personally  ?Anesthesiologist: Effie Berkshire, MD ? ?Additional Notes: ?Patient tolerated the procedure well. Local anesthetic introduced in an incremental fashion under minimal resistance after negative aspirations. No paresthesias were elicited. After completion of the procedure, no acute issues were identified and patient continued to be monitored by RN.  ? ? ? ? ? ?

## 2021-10-27 NOTE — Op Note (Signed)
10/27/2021 ? ?9:46 AM ? ?PATIENT:  Kayla Chaney   ? ?PRE-OPERATIVE DIAGNOSIS:  Posterior Tibial Tendon Insufficiency Right ? ?POST-OPERATIVE DIAGNOSIS:  Same ? ?PROCEDURE:  RIGHT SUBTALAR AND TALONAVICULAR FUSION ?C arm fluoroscopy to verify reduction and internal fixation. ? ?SURGEON:  Newt Minion, MD ? ?PHYSICIAN ASSISTANT:None ?ANESTHESIA:   General ? ?PREOPERATIVE INDICATIONS:  [name] is a  46 y.o. female with a diagnosis of Posterior Tibial Tendon Insufficiency Right who failed conservative measures and elected for surgical management.   ? ?The risks benefits and alternatives were discussed with the patient preoperatively including but not limited to the risks of infection, bleeding, nerve injury, cardiopulmonary complications, the need for revision surgery, among others, and the patient was willing to proceed. ? ?OPERATIVE IMPLANTS: Demineralized bone matrix 5 cc. ?Kerecis micro graft 34 g. ?6.5 headless cannulated screws x2. ?Staples x2. ? ?@ENCIMAGES @ ? ?OPERATIVE FINDINGS: C-arm fluoroscopy verified reduction of the subtalar and talonavicular fusion. ? ?OPERATIVE PROCEDURE: Patient was brought the operating room after undergoing a regional anesthetic.  After adequate levels anesthesia were obtained patient's right lower extremity was prepped using DuraPrep draped into a sterile field a timeout was called.  An oblique incision was made over the sinus Tarsi.  This was carried down to the subtalar joint the peroneal tendons were protected neurovascular structures were protected dorsally.  Once the subtalar joint was visualized under direct visualization the 4 mm rem B bur was used to debride the articular cartilage from the posterior facet a curette was used to further debride this joint.  Attention was then focused on the talonavicular joint.  A dorsal incision was made between the anterior tibial tendon and the EHL tendon.  This was carried down to the talonavicular joint retractors were placed to  protect the tendons and the 4 mm bur was used to debride articular cartilage from the talonavicular joint.  Curette was used to further debride this.  The Kerecis micro powder and the demineralized bone matrix were mixed and placed in the talonavicular and subtalar joint.  To see the foot was reduced out of pronation and the talonavicular joint was stabilized with 2 staples across the talonavicular joint.  Tension was then focused on the subtalar joint.  Incision was made over the calcaneus and 2 guidewires were placed from the calcaneus into the talus C arthroscopy verified reduction and a 65 and 75 mm headless cannulated screw was used to stabilize the subtalar joint.  See arthroscopy verified reduction.  The incisions were closed with 2-0 nylon a sterile dressing was applied patient was taken the PACU in stable condition. ? ? ?DISCHARGE PLANNING: ? ?Antibiotic duration: Preoperative antibiotics ? ?Weightbearing: Nonweightbearing on the right ? ?Pain medication: Prescription for Percocet ? ?Dressing care/ Wound VAC: Follow-up in the office 1 week to change the dressing ? ?Ambulatory devices: Kneeling scooter or crutches ? ?Discharge to: Home. ? ?Follow-up: In the office 1 week post operative. ? ? ? ? ? ? ?  ?

## 2021-10-27 NOTE — H&P (Signed)
Kayla Chaney is an 46 y.o. female.   ?Chief Complaint: Pain weakness and deformity right foot and ankle. ?HPI: Patient is a 46 year old woman who is seen for initial evaluation for posterior tibial tendon insufficiency on the right.  Patient has had pain for over 6 months she has been in a cam walker boot for 2 weeks still with persistent pain.  Patient states that the initial injury occurred during Zumb[employer] class.  She has had an MRI scan February 10.  She states she was doing high knee raises when she felt her ankle pop. ? ?Past Medical History:  ?Diagnosis Date  ? COVID 08/2020  ? mild case  ? Hot flashes due to menopause 07/09/2021  ? Hyperlipidemia   ? IUD (intrauterine device) in place 07/09/2021  ? ? ?Past Surgical History:  ?Procedure Laterality Date  ? CESAREAN SECTION  2003, 2008  ? COLONOSCOPY WITH PROPOFOL N/A 11/20/2020  ? Procedure: COLONOSCOPY WITH PROPOFOL;  Surgeon: Eloise Harman, DO;  [facility];  Service: Endoscopy;  Laterality: N/A;  am/ASA II, pt tested +1/24 thru [employer] at work <90 - results emailed to [facility] - advised pt current arrival time of 7:00 - needed to know for transportation cy 3/21  ? DILITATION & CURRETTAGE/HYSTROSCOPY WITH ESSURE N/A 2008  ? Retained placenta from c-section birth  ? POLYPECTOMY  11/20/2020  ? Procedure: POLYPECTOMY;  Surgeon: Eloise Harman, DO;  [facility];  Service: Endoscopy;;  ? ? ?Family History  ?Problem Relation Age of Onset  ? Stroke Mother   ? Hypertension Father   ? Diabetes Father   ? Brain cancer Sister   ? Stroke Maternal Grandmother   ? ?Social History:  reports that she has never smoked. She has never used smokeless tobacco. She reports current alcohol use. She reports that she does not use drugs. ? ?Allergies: No Known Allergies ? ?Medications Prior to Admission  ?Medication Sig Dispense Refill  ? Calcium Carbonate-Vit D-Min (SM CALCIUM/VITAMIN D3) 600-800 MG-UNIT TABS Take 1 tablet by mouth daily.    ?  cholecalciferol (VITAMIN D3) 25 MCG (1000 UNIT) tablet Take 1,000 Units by mouth daily.    ? estradiol (ESTRACE) 0.5 MG tablet TAKE 1 TABLET BY MOUTH ONCE A DAY 90 tablet 4  ? hydrOXYzine (ATARAX/VISTARIL) 10 MG tablet TAKE 1 TO 2 TABLETS BY MOUTH AT BEDTIME AS NEEDED (Patient taking differently: Take 20 mg by mouth at bedtime as needed (sleep).) 180 tablet 3  ? levonorgestrel (MIRENA) 20 MCG/24HR IUD 1 each by Intrauterine route once.    ? meloxicam (MOBIC) 7.5 MG tablet Take 1 tablet  by mouth daily. 30 tablet 5  ? methocarbamol (ROBAXIN) 500 MG tablet Take 1 tablet by mouth every 8 (eight) hours as needed for muscle spasms. 90 tablet 3  ? valACYclovir (VALTREX) 1000 MG tablet TAKE 2 TABLETS BY MOUTH 2 TIMES DAILY FOR 1 DAY FOR OUTBREAK 30 tablet 2  ? predniSONE (DELTASONE) 10 MG tablet Take 1 tablet by mouth 3 times daily. 60 tablet 5  ? ? ?No results found for this or any previous visit (from the past 48 hour(s)). ?No results found. ? ?Review of Systems ? ?Blood pressure 136/81, pulse 90, temperature 98.3 ?F (36.8 ?C), temperature source Oral, resp. rate 17, height 5' 5.5" (1.664 m), weight 76.2 kg, SpO2 97 %. ?Physical Exam  ? ?Assessment/Plan ?1. Insufficiency of right posterior tibial tendon   ?  ?  ?Plan: With patient's posterior tibial tendon insufficiency and  significant deformity of the hindfoot I feel her best option would be to proceed with a talonavicular and subtalar fusion.  We will plan for surgery as an outpatient at [facility] operating room.  Risks and benefits were discussed including time of healing and potential for additional surgery.  Patient states she understands wished to proceed at this time. ? ?Newt Minion, MD ?10/27/2021, 7:30 AM ? ? ? ?

## 2021-10-27 NOTE — Transfer of Care (Signed)
Immediate Anesthesia Transfer of Care Note ? ?Patient: Kayla Chaney ? ?Procedure(s) Performed: RIGHT SUBTALAR AND TALONAVICULAR FUSION (Right: Foot) ? ?Patient Location: PACU ? ?Anesthesia Type:General and Regional ? ?Level of Consciousness: drowsy and patient cooperative ? ?Airway & Oxygen Therapy: Patient Spontanous Breathing and Patient connected to face mask oxygen ? ?Post-op Assessment: Report given to RN and Post -op Vital signs reviewed and stable ? ?Post vital signs: Reviewed and stable ? ?Last Vitals:  ?Vitals Value Taken Time  ?BP 118/60 10/27/21 0943  ?Temp    ?Pulse 87 10/27/21 0943  ?Resp 14 10/27/21 0943  ?SpO2 98 % 10/27/21 0943  ?Vitals shown include unvalidated device data. ? ?Last Pain:  ?Vitals:  ? 10/27/21 0734  ?TempSrc:   ?PainSc: 0-No pain  ?   ? ?  ? ?Complications: No notable events documented. ?

## 2021-10-27 NOTE — Anesthesia Procedure Notes (Signed)
Anesthesia Regional Block: Popliteal block  ? ?Pre-Anesthetic Checklist: , timeout performed,  Correct Patient, Correct Site, Correct Laterality,  Correct Procedure, Correct Position, site marked,  Risks and benefits discussed,  Surgical consent,  Pre-op evaluation,  At surgeon's request and post-op pain management ? ?Laterality: Right ? ?Prep: chloraprep     ?  ?Needles:  ?Injection technique: Single-shot ? ?Needle Type: Echogenic Stimulator Needle   ? ? ?Needle Length: 9cm  ?Needle Gauge: 21  ? ? ? ?Additional Needles: ? ? ?Procedures:,,,, ultrasound used (permanent image in chart),,    ?Narrative:  ?Start time: 10/27/2021 7:40 AM ?End time: 10/27/2021 7:45 AM ?Injection made incrementally with aspirations every 5 mL. ? ?Performed by: Personally  ?Anesthesiologist: Effie Berkshire, MD ? ?Additional Notes: ?Patient tolerated the procedure well. Local anesthetic introduced in an incremental fashion under minimal resistance after negative aspirations. No paresthesias were elicited. After completion of the procedure, no acute issues were identified and patient continued to be monitored by RN.  ? ? ? ? ? ?

## 2021-10-29 ENCOUNTER — Encounter: Payer: Self-pay | Admitting: Orthopedic Surgery

## 2021-10-29 ENCOUNTER — Encounter (HOSPITAL_COMMUNITY): Payer: Self-pay | Admitting: Orthopedic Surgery

## 2021-11-01 ENCOUNTER — Other Ambulatory Visit: Payer: Self-pay | Admitting: Orthopedic Surgery

## 2021-11-01 ENCOUNTER — Encounter: Payer: Self-pay | Admitting: Orthopedic Surgery

## 2021-11-01 ENCOUNTER — Other Ambulatory Visit (HOSPITAL_COMMUNITY): Payer: Self-pay

## 2021-11-01 MED ORDER — GABAPENTIN 300 MG PO CAPS
300.0000 mg | ORAL_CAPSULE | Freq: Three times a day (TID) | ORAL | 3 refills | Status: DC
  Filled 2021-11-01: qty 90, 30d supply, fill #0

## 2021-11-01 MED ORDER — OXYCODONE-ACETAMINOPHEN 5-325 MG PO TABS
1.0000 | ORAL_TABLET | ORAL | 0 refills | Status: DC | PRN
  Filled 2021-11-01: qty 30, 5d supply, fill #0

## 2021-11-07 ENCOUNTER — Other Ambulatory Visit: Payer: Self-pay | Admitting: Nurse Practitioner

## 2021-11-07 DIAGNOSIS — M5030 Other cervical disc degeneration, unspecified cervical region: Secondary | ICD-10-CM

## 2021-11-08 ENCOUNTER — Other Ambulatory Visit (HOSPITAL_COMMUNITY): Payer: Self-pay

## 2021-11-08 NOTE — Telephone Encounter (Signed)
Spoke with pt advised of Kayla Chaney's message she states that she is no longer taking the percocet and that she needs a refill on the methocarbamol because she has been taking it for a while now. Advised provider not in office today but will relay message. Please advise ?

## 2021-11-10 ENCOUNTER — Encounter: Payer: Self-pay | Admitting: Family

## 2021-11-10 ENCOUNTER — Other Ambulatory Visit (HOSPITAL_COMMUNITY): Payer: Self-pay

## 2021-11-10 ENCOUNTER — Ambulatory Visit: Payer: Self-pay

## 2021-11-10 ENCOUNTER — Ambulatory Visit (INDEPENDENT_AMBULATORY_CARE_PROVIDER_SITE_OTHER): Payer: No Typology Code available for payment source | Admitting: Family

## 2021-11-10 DIAGNOSIS — M5030 Other cervical disc degeneration, unspecified cervical region: Secondary | ICD-10-CM

## 2021-11-10 DIAGNOSIS — M76821 Posterior tibial tendinitis, right leg: Secondary | ICD-10-CM

## 2021-11-10 MED ORDER — ACETAMINOPHEN-CODEINE #3 300-30 MG PO TABS
1.0000 | ORAL_TABLET | Freq: Four times a day (QID) | ORAL | 0 refills | Status: DC | PRN
  Filled 2021-11-10: qty 30, 8d supply, fill #0

## 2021-11-10 MED ORDER — METHOCARBAMOL 500 MG PO TABS
ORAL_TABLET | Freq: Three times a day (TID) | ORAL | 3 refills | Status: DC | PRN
  Filled 2021-11-10: qty 90, 30d supply, fill #0
  Filled 2021-12-01: qty 90, 30d supply, fill #1
  Filled 2021-12-28: qty 90, 30d supply, fill #2
  Filled 2022-01-24: qty 90, 30d supply, fill #3

## 2021-11-10 NOTE — Progress Notes (Signed)
? ?  Post-Op Visit Note ?  ?Patient: Kayla Chaney           ?Date of Birth: [birth year]           ?MRN: [id number] ?Visit Date: 11/10/2021 ?PCP: Renee Rival, FNP ? ?Chief Complaint:  ?Chief Complaint  ?Patient presents with  ? Right Foot - Routine Post Op  ?  10/27/21 right subtalar and talonavicular fusion  ? ? ?HPI:  ?HPI ?The patient is a 46 year old woman seen 2 weeks status post subtalar and talonavicular fusion she has been doing daily Dial soap cleansing and dry dressings she is nonweightbearing with a kneeling scooter ? ?Ortho Exam ?Her incisions and portals are healing well sutures harvested today without incident these are clean and dry there is no surrounding erythema ?Visit Diagnoses:  ?1. Insufficiency of right posterior tibial tendon   ? ? ?Plan: Continue daily Dial soap cleansing continue nonweightbearing with Cam walker sutures harvested we will follow-up in the office in 2 weeks ? ?Follow-Up Instructions: Return in about 2 weeks (around 11/24/2021).  ? ?Imaging: ?No results found. ? ?Orders:  ?Orders Placed This Encounter  ?Procedures  ? XR Foot Complete Right  ? ?No orders of the defined types were placed in this encounter. ? ? ? ?PMFS History: ?Patient Active Problem List  ? Diagnosis Date Noted  ? Posterior tibialis tendon insufficiency   ? Otitis externa, left 07/25/2021  ? Acute otalgia, left 07/23/2021  ? Palpitations 07/09/2021  ? Hot flashes due to menopause 07/09/2021  ? IUD (intrauterine device) in place 07/09/2021  ? Hyperlipemia 07/09/2021  ? Insomnia 11/13/2013  ? Degenerative joint disease of cervical spine 11/13/2013  ? ?Past Medical History:  ?Diagnosis Date  ? COVID 08/2020  ? mild case  ? Hot flashes due to menopause 07/09/2021  ? Hyperlipidemia   ? IUD (intrauterine device) in place 07/09/2021  ?  ?Family History  ?Problem Relation Age of Onset  ? Stroke Mother   ? Hypertension Father   ? Diabetes Father   ? Brain cancer Sister   ? Stroke Maternal Grandmother   ?  ?Past  Surgical History:  ?Procedure Laterality Date  ? CESAREAN SECTION  2003, 2008  ? COLONOSCOPY WITH PROPOFOL N/A 11/20/2020  ? Procedure: COLONOSCOPY WITH PROPOFOL;  Surgeon: Eloise Harman, DO;  [facility];  Service: Endoscopy;  Laterality: N/A;  am/ASA II, pt tested +1/24 thru [employer] at work <90 - results emailed to [facility] - advised pt current arrival time of 7:00 - needed to know for transportation cy 3/21  ? DILITATION & CURRETTAGE/HYSTROSCOPY WITH ESSURE N/A 2008  ? Retained placenta from c-section birth  ? FOOT ARTHRODESIS Right 10/27/2021  ? Procedure: RIGHT SUBTALAR AND TALONAVICULAR FUSION;  Surgeon: Newt Minion, MD;  Location: [facility];  Service: Orthopedics;  Laterality: Right;  ? POLYPECTOMY  11/20/2020  ? Procedure: POLYPECTOMY;  Surgeon: Eloise Harman, DO;  [facility];  Service: Endoscopy;;  ? ?Social History  ? ?Occupational History  ?  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 Smoking status: Never  ? Smokeless tobacco: Never  ?Vaping Use  ? Vaping Use: Never used  ?Substance and Sexual Activity  ? Alcohol use: Yes  ?  Comment: occ  ? Drug use: No  ? Sexual activity: Yes  ?  Birth control/protection: I.U.D.  ? ? ?

## 2021-11-12 ENCOUNTER — Encounter: Payer: Self-pay | Admitting: Orthopedic Surgery

## 2021-11-17 ENCOUNTER — Other Ambulatory Visit: Payer: Self-pay | Admitting: Family

## 2021-11-17 ENCOUNTER — Other Ambulatory Visit (HOSPITAL_COMMUNITY): Payer: Self-pay

## 2021-11-17 MED ORDER — ACETAMINOPHEN-CODEINE #3 300-30 MG PO TABS
1.0000 | ORAL_TABLET | Freq: Four times a day (QID) | ORAL | 0 refills | Status: DC | PRN
  Filled 2021-11-17: qty 30, 8d supply, fill #0

## 2021-11-19 ENCOUNTER — Other Ambulatory Visit (HOSPITAL_COMMUNITY): Payer: Self-pay

## 2021-11-19 ENCOUNTER — Encounter: Payer: Self-pay | Admitting: Orthopedic Surgery

## 2021-11-25 ENCOUNTER — Encounter: Payer: Self-pay | Admitting: Orthopedic Surgery

## 2021-11-25 ENCOUNTER — Ambulatory Visit (INDEPENDENT_AMBULATORY_CARE_PROVIDER_SITE_OTHER): Payer: No Typology Code available for payment source | Admitting: Orthopedic Surgery

## 2021-11-25 ENCOUNTER — Other Ambulatory Visit (HOSPITAL_COMMUNITY): Payer: Self-pay

## 2021-11-25 DIAGNOSIS — M76821 Posterior tibial tendinitis, right leg: Secondary | ICD-10-CM

## 2021-11-25 MED ORDER — GABAPENTIN 300 MG PO CAPS
300.0000 mg | ORAL_CAPSULE | Freq: Four times a day (QID) | ORAL | 3 refills | Status: DC
  Filled 2021-11-25: qty 120, 30d supply, fill #0
  Filled 2021-12-28: qty 120, 30d supply, fill #1
  Filled 2022-01-24: qty 120, 30d supply, fill #2
  Filled 2022-02-15: qty 120, 30d supply, fill #3

## 2021-11-25 NOTE — Progress Notes (Signed)
? ?Office Visit Note ?  ?Patient: Kayla Chaney           ?Date of Birth: 05-Oct-1975           ?MRN: [id number] ?Visit Date: 11/25/2021 ?             ?Requested by: Renee Rival, FNP ?[address] ?[address] ?[place],  [place] [address] ?PCP: Renee Rival, FNP ? ?Chief Complaint  ?Patient presents with  ? Right Foot - Routine Post Op  ?  10/27/21 right sub/tal fusion  ? ? ? ? ?HPI: ?Patient is a 46 year old woman who presents 4 weeks status post right subtalar and talonavicular fusion she has been nonweightbearing in a cam boot and a kneeling scooter.  She states that she has fallen on her foot.  She states she has neuropathic burning pain and is currently taking Neurontin 4 times a day she states that this does help with the burning pain. ? ?Assessment & Plan: ?Visit Diagnoses:  ?1. Insufficiency of right posterior tibial tendon   ? ? ?Plan: Prescription was called in for Neurontin 300 mg 4 times a day she will advance to a short fracture boot. ? ?Three-view radiographs of the right foot at follow-up at which time anticipate advancing to weightbearing. ? ?Follow-Up Instructions: Return in about 2 weeks (around 12/09/2021).  ? ?Ortho Exam ? ?Patient is alert, oriented, no adenopathy, well-dressed, normal affect, normal respiratory effort. ?Examination the incisions are well-healed she has good range of motion of the ankle.  There is no redness or cellulitis there is no drainage. ? ?Imaging: ?No results found. ?No images are attached to the encounter. ? ?Labs: ?Lab Results  ?Component Value Date  ? [place] 10/28/2014  ? ? ? ?Lab Results  ?Component Value Date  ? ALBUMIN 4.4 07/03/2020  ? ALBUMIN 4.5 08/13/2019  ? ALBUMIN 4.5 01/25/2019  ? ? ?No results found for: MG ?Lab Results  ?Component Value Date  ? VD25OH 38.5 01/25/2019  ? ? ?No results found for: PREALBUMIN ? ?  Latest Ref Rng & Units 10/27/2021  ?  7:38 AM 08/13/2019  ?  9:36 AM  ?CBC EXTENDED  ?WBC 4.0 - 10.5 K/uL 5.7   4.7     ?RBC 3.87 - 5.11 MIL/uL 4.01   4.30    ?Hemoglobin 12.0 - 15.0 g/dL 12.6   14.2    ?HCT 36.0 - 46.0 % 36.7   40.5    ?Platelets 150 - 400 K/uL 245   203    ? ? ? ?There is no height or weight on file to calculate BMI. ? ?Orders:  ?No orders of the defined types were placed in this encounter. ? ?Meds ordered this encounter  ?Medications  ? gabapentin (NEURONTIN) 300 MG capsule  ?  Sig: Take 1 capsule (300 mg) by mouth 3 to 4 times a day as needed for neuropathy pain.  ?  Dispense:  120 capsule  ?  Refill:  3  ? ? ? Procedures: ?No procedures performed ? ?Clinical Data: ?No additional findings. ? ?ROS: ? ?All other systems negative, except as noted in the HPI. ?Review of Systems ? ?Objective: ?Vital Signs: There were no vitals taken for this visit. ? ?Specialty Comments:  ?No specialty comments available. ? ?PMFS History: ?Patient Active Problem List  ? Diagnosis Date Noted  ? Posterior tibialis tendon insufficiency   ? Otitis externa, left 07/25/2021  ? Acute otalgia, left 07/23/2021  ? Palpitations 07/09/2021  ?  Hot flashes due to menopause 07/09/2021  ? IUD (intrauterine device) in place 07/09/2021  ? Hyperlipemia 07/09/2021  ? Insomnia 11/13/2013  ? Degenerative joint disease of cervical spine 11/13/2013  ? ?Past Medical History:  ?Diagnosis Date  ? COVID 08/2020  ? mild case  ? Hot flashes due to menopause 07/09/2021  ? Hyperlipidemia   ? IUD (intrauterine device) in place 07/09/2021  ?  ?Family History  ?Problem Relation Age of Onset  ? Stroke Mother   ? Hypertension Father   ? Diabetes Father   ? Brain cancer Sister   ? Stroke Maternal Grandmother   ?  ?Past Surgical History:  ?Procedure Laterality Date  ? CESAREAN SECTION  2003, 2008  ? COLONOSCOPY WITH PROPOFOL N/A 11/20/2020  ? Procedure: COLONOSCOPY WITH PROPOFOL;  Surgeon: Eloise Harman, DO;  [facility];  Service: Endoscopy;  Laterality: N/A;  am/ASA II, pt tested +1/24 thru [employer] at work <90 - results emailed to [facility] - advised pt  current arrival time of 7:00 - needed to know for transportation cy 3/21  ? DILITATION & CURRETTAGE/HYSTROSCOPY WITH ESSURE N/A 2008  ? Retained placenta from c-section birth  ? FOOT ARTHRODESIS Right 10/27/2021  ? Procedure: RIGHT SUBTALAR AND TALONAVICULAR FUSION;  Surgeon: Newt Minion, MD;  Location: [facility];  Service: Orthopedics;  Laterality: Right;  ? POLYPECTOMY  11/20/2020  ? Procedure: POLYPECTOMY;  Surgeon: Eloise Harman, DO;  [facility];  Service: Endoscopy;;  ? ?Social History  ? ?Occupational History  ?  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 Smoking status: Never  ? Smokeless tobacco: Never  ?Vaping Use  ? Vaping Use: Never used  ?Substance and Sexual Activity  ? Alcohol use: Yes  ?  Comment: occ  ? Drug use: No  ? Sexual activity: Yes  ?  Birth control/protection: I.U.D.  ? ? ? ? ? ?

## 2021-11-26 ENCOUNTER — Other Ambulatory Visit: Payer: Self-pay | Admitting: Family

## 2021-11-26 ENCOUNTER — Other Ambulatory Visit (HOSPITAL_COMMUNITY): Payer: Self-pay

## 2021-11-26 MED ORDER — ACETAMINOPHEN-CODEINE #3 300-30 MG PO TABS
1.0000 | ORAL_TABLET | Freq: Four times a day (QID) | ORAL | 0 refills | Status: DC | PRN
  Filled 2021-11-26: qty 30, 8d supply, fill #0

## 2021-12-01 ENCOUNTER — Other Ambulatory Visit (HOSPITAL_COMMUNITY): Payer: Self-pay

## 2021-12-04 ENCOUNTER — Other Ambulatory Visit (HOSPITAL_COMMUNITY): Payer: Self-pay

## 2021-12-09 ENCOUNTER — Other Ambulatory Visit: Payer: Self-pay | Admitting: Family

## 2021-12-09 ENCOUNTER — Ambulatory Visit (INDEPENDENT_AMBULATORY_CARE_PROVIDER_SITE_OTHER): Payer: No Typology Code available for payment source

## 2021-12-09 ENCOUNTER — Ambulatory Visit (INDEPENDENT_AMBULATORY_CARE_PROVIDER_SITE_OTHER): Payer: No Typology Code available for payment source | Admitting: Orthopedic Surgery

## 2021-12-09 ENCOUNTER — Encounter: Payer: Self-pay | Admitting: Orthopedic Surgery

## 2021-12-09 DIAGNOSIS — M76821 Posterior tibial tendinitis, right leg: Secondary | ICD-10-CM

## 2021-12-09 DIAGNOSIS — M76829 Posterior tibial tendinitis, unspecified leg: Secondary | ICD-10-CM

## 2021-12-10 ENCOUNTER — Other Ambulatory Visit (HOSPITAL_COMMUNITY): Payer: Self-pay

## 2021-12-10 MED ORDER — ACETAMINOPHEN-CODEINE #3 300-30 MG PO TABS
1.0000 | ORAL_TABLET | Freq: Three times a day (TID) | ORAL | 0 refills | Status: DC | PRN
  Filled 2021-12-10: qty 30, 10d supply, fill #0

## 2021-12-16 ENCOUNTER — Ambulatory Visit: Payer: No Typology Code available for payment source | Admitting: Nurse Practitioner

## 2021-12-19 ENCOUNTER — Encounter: Payer: Self-pay | Admitting: Orthopedic Surgery

## 2021-12-19 ENCOUNTER — Other Ambulatory Visit: Payer: Self-pay | Admitting: Family

## 2021-12-19 NOTE — Progress Notes (Signed)
? ?Office Visit Note ?  ?Patient: Kayla Chaney           ?Date of Birth: 1976/05/07           ?MRN: [id number] ?Visit Date: 12/09/2021 ?             ?Requested by: Renee Rival, FNP ?[address] ?[address] ?[place],  [place] [address] ?PCP: Renee Rival, FNP ? ?Chief Complaint  ?Patient presents with  ? Right Foot - Routine Post Op  ?  10/27/21 right subtalar and talonavicular fusion  ? ? ? ? ?HPI: ?Patient is a 46 year old woman who presents 6 weeks status post right subtalar and talonavicular fusion.  She is in a short fracture boot uses Neurontin 300 mg 4 times a day that she states helps. ? ?Assessment & Plan: ?Visit Diagnoses:  ?1. PTTD (posterior tibial tendon dysfunction)   ? ? ?Plan: Patient will advance to weightbearing as tolerated in the fracture boot.  A prescription for Tylenol 3.  Three-view radiographs of the right foot at follow-up. ? ?Follow-Up Instructions: Return in about 3 weeks (around 12/30/2021).  ? ?Ortho Exam ? ?Patient is alert, oriented, no adenopathy, well-dressed, normal affect, normal respiratory effort. ?Examination the incisions are well-healed there is no redness no cellulitis there is minimal swelling. ? ?Imaging: ?No results found. ?No images are attached to the encounter. ? ?Labs: ?Lab Results  ?Component Value Date  ? [place] 10/28/2014  ? ? ? ?Lab Results  ?Component Value Date  ? ALBUMIN 4.4 07/03/2020  ? ALBUMIN 4.5 08/13/2019  ? ALBUMIN 4.5 01/25/2019  ? ? ?No results found for: MG ?Lab Results  ?Component Value Date  ? VD25OH 38.5 01/25/2019  ? ? ?No results found for: PREALBUMIN ? ?  Latest Ref Rng & Units 10/27/2021  ?  7:38 AM 08/13/2019  ?  9:36 AM  ?CBC EXTENDED  ?WBC 4.0 - 10.5 K/uL 5.7   4.7    ?RBC 3.87 - 5.11 MIL/uL 4.01   4.30    ?Hemoglobin 12.0 - 15.0 g/dL 12.6   14.2    ?HCT 36.0 - 46.0 % 36.7   40.5    ?Platelets 150 - 400 K/uL 245   203    ? ? ? ?There is no height or weight on file to calculate BMI. ? ?Orders:  ?Orders  Placed This Encounter  ?Procedures  ? XR Foot Complete Right  ? ?No orders of the defined types were placed in this encounter. ? ? ? Procedures: ?No procedures performed ? ?Clinical Data: ?No additional findings. ? ?ROS: ? ?All other systems negative, except as noted in the HPI. ?Review of Systems ? ?Objective: ?Vital Signs: There were no vitals taken for this visit. ? ?Specialty Comments:  ?No specialty comments available. ? ?PMFS History: ?Patient Active Problem List  ? Diagnosis Date Noted  ? Posterior tibialis tendon insufficiency   ? Otitis externa, left 07/25/2021  ? Acute otalgia, left 07/23/2021  ? Palpitations 07/09/2021  ? Hot flashes due to menopause 07/09/2021  ? IUD (intrauterine device) in place 07/09/2021  ? Hyperlipemia 07/09/2021  ? Insomnia 11/13/2013  ? Degenerative joint disease of cervical spine 11/13/2013  ? ?Past Medical History:  ?Diagnosis Date  ? COVID 08/2020  ? mild case  ? Hot flashes due to menopause 07/09/2021  ? Hyperlipidemia   ? IUD (intrauterine device) in place 07/09/2021  ?  ?Family History  ?Problem Relation Age of Onset  ? Stroke Mother   ?  Hypertension Father   ? Diabetes Father   ? Brain cancer Sister   ? Stroke Maternal Grandmother   ?  ?Past Surgical History:  ?Procedure Laterality Date  ? CESAREAN SECTION  2003, 2008  ? COLONOSCOPY WITH PROPOFOL N/A 11/20/2020  ? Procedure: COLONOSCOPY WITH PROPOFOL;  Surgeon: Eloise Harman, DO;  [facility];  Service: Endoscopy;  Laterality: N/A;  am/ASA II, pt tested +1/24 thru Health at work <90 - results emailed to [facility] - advised pt current arrival time of 7:00 - needed to know for transportation cy 3/21  ? DILITATION & CURRETTAGE/HYSTROSCOPY WITH ESSURE N/A 2008  ? Retained placenta from c-section birth  ? FOOT ARTHRODESIS Right 10/27/2021  ? Procedure: RIGHT SUBTALAR AND TALONAVICULAR FUSION;  Surgeon: Newt Minion, MD;  Location: [facility];  Service: Orthopedics;  Laterality: Right;  ? POLYPECTOMY  11/20/2020  ?  Procedure: POLYPECTOMY;  Surgeon: Eloise Harman, DO;  [facility];  Service: Endoscopy;;  ? ?Social History  ? ?Occupational History  ?  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 Smoking status: Never  ? Smokeless tobacco: Never  ?Vaping Use  ? Vaping Use: Never used  ?Substance and Sexual Activity  ? Alcohol use: Yes  ?  Comment: occ  ? Drug use: No  ? Sexual activity: Yes  ?  Birth control/protection: I.U.D.  ? ? ? ? ? ?

## 2021-12-20 ENCOUNTER — Other Ambulatory Visit (HOSPITAL_COMMUNITY): Payer: Self-pay

## 2021-12-20 MED ORDER — ACETAMINOPHEN-CODEINE #3 300-30 MG PO TABS
1.0000 | ORAL_TABLET | Freq: Three times a day (TID) | ORAL | 0 refills | Status: DC | PRN
  Filled 2021-12-20: qty 30, 10d supply, fill #0

## 2021-12-24 ENCOUNTER — Encounter: Payer: Self-pay | Admitting: Orthopedic Surgery

## 2021-12-28 ENCOUNTER — Other Ambulatory Visit (HOSPITAL_COMMUNITY): Payer: Self-pay

## 2021-12-30 ENCOUNTER — Ambulatory Visit (INDEPENDENT_AMBULATORY_CARE_PROVIDER_SITE_OTHER): Payer: No Typology Code available for payment source | Admitting: Orthopedic Surgery

## 2021-12-30 ENCOUNTER — Ambulatory Visit (INDEPENDENT_AMBULATORY_CARE_PROVIDER_SITE_OTHER): Payer: No Typology Code available for payment source

## 2021-12-30 DIAGNOSIS — M76829 Posterior tibial tendinitis, unspecified leg: Secondary | ICD-10-CM

## 2021-12-30 DIAGNOSIS — M76821 Posterior tibial tendinitis, right leg: Secondary | ICD-10-CM | POA: Diagnosis not present

## 2022-01-01 ENCOUNTER — Other Ambulatory Visit (HOSPITAL_COMMUNITY): Payer: Self-pay

## 2022-01-02 ENCOUNTER — Other Ambulatory Visit: Payer: Self-pay | Admitting: Orthopedic Surgery

## 2022-01-04 ENCOUNTER — Other Ambulatory Visit (HOSPITAL_COMMUNITY): Payer: Self-pay

## 2022-01-04 MED ORDER — ACETAMINOPHEN-CODEINE #3 300-30 MG PO TABS
1.0000 | ORAL_TABLET | Freq: Two times a day (BID) | ORAL | 0 refills | Status: DC | PRN
  Filled 2022-01-04: qty 20, 10d supply, fill #0

## 2022-01-17 ENCOUNTER — Ambulatory Visit (INDEPENDENT_AMBULATORY_CARE_PROVIDER_SITE_OTHER): Payer: No Typology Code available for payment source

## 2022-01-17 ENCOUNTER — Ambulatory Visit (INDEPENDENT_AMBULATORY_CARE_PROVIDER_SITE_OTHER): Payer: No Typology Code available for payment source | Admitting: Orthopedic Surgery

## 2022-01-17 ENCOUNTER — Other Ambulatory Visit (HOSPITAL_COMMUNITY): Payer: Self-pay

## 2022-01-17 DIAGNOSIS — M76821 Posterior tibial tendinitis, right leg: Secondary | ICD-10-CM | POA: Diagnosis not present

## 2022-01-17 DIAGNOSIS — M76829 Posterior tibial tendinitis, unspecified leg: Secondary | ICD-10-CM

## 2022-01-17 MED ORDER — NABUMETONE 750 MG PO TABS
750.0000 mg | ORAL_TABLET | Freq: Two times a day (BID) | ORAL | 3 refills | Status: DC | PRN
  Filled 2022-01-17: qty 60, 30d supply, fill #0
  Filled 2022-02-15: qty 60, 30d supply, fill #1
  Filled 2022-03-13: qty 60, 30d supply, fill #2
  Filled 2022-04-08: qty 60, 30d supply, fill #3

## 2022-01-18 ENCOUNTER — Ambulatory Visit: Payer: No Typology Code available for payment source | Admitting: Orthopedic Surgery

## 2022-01-25 ENCOUNTER — Ambulatory Visit: Payer: No Typology Code available for payment source

## 2022-01-25 ENCOUNTER — Ambulatory Visit: Payer: Self-pay

## 2022-01-25 ENCOUNTER — Other Ambulatory Visit: Payer: Self-pay | Admitting: Nurse Practitioner

## 2022-01-25 ENCOUNTER — Other Ambulatory Visit (HOSPITAL_COMMUNITY): Payer: Self-pay

## 2022-01-25 DIAGNOSIS — M79671 Pain in right foot: Secondary | ICD-10-CM

## 2022-01-26 ENCOUNTER — Other Ambulatory Visit (HOSPITAL_COMMUNITY): Payer: Self-pay

## 2022-02-01 ENCOUNTER — Encounter: Payer: Self-pay | Admitting: Orthopedic Surgery

## 2022-02-01 NOTE — Progress Notes (Signed)
Office Visit Note   Patient: Kayla Chaney           Date of Birth: 1975-11-21           MRN: [id number] Visit Date: 01/17/2022              Requested by: Renee Rival, [place],  [place] [address] PCP: Renee Rival, FNP  Chief Complaint  Patient presents with   Right Foot - Routine Post Op    10/27/2021 right subtalar talonavicular fusion with kerecis powder       HPI: Patient is a 46 year old woman who presents 3 months status post right subtalar and talonavicular fusion she is weightbearing in a fracture boot.  Assessment & Plan: Visit Diagnoses:  1. PTTD (posterior tibial tendon dysfunction)     Plan: Advance activities as tolerated she will wean off the Neurontin as she feels comfortable prescription was provided for Relafen twice a day.  Return to work and activities as tolerated.  Follow-Up Instructions: Return if symptoms worsen or fail to improve.   Ortho Exam  Patient is alert, oriented, no adenopathy, well-dressed, normal affect, normal respiratory effort. Examination the incisions are well-healed she has good range of motion of the ankle there is no redness no cellulitis no dystrophic changes.  Imaging: No results found. No images are attached to the encounter.  Labs: Lab Results  Component Value Date   LABORGA ESCHERICHIA COLI 10/28/2014     Lab Results  Component Value Date   ALBUMIN 4.4 07/03/2020   ALBUMIN 4.5 08/13/2019   ALBUMIN 4.5 01/25/2019    No results found for: MG Lab Results  Component Value Date   VD25OH 38.5 01/25/2019    No results found for: PREALBUMIN    Latest Ref Rng & Units 10/27/2021    7:38 AM 08/13/2019    9:36 AM  CBC EXTENDED  WBC 4.0 - 10.5 K/uL 5.7   4.7    RBC 3.87 - 5.11 MIL/uL 4.01   4.30    Hemoglobin 12.0 - 15.0 g/dL 12.6   14.2    HCT 36.0 - 46.0 % 36.7   40.5    Platelets 150 - 400 K/uL 245   203       There is no height or weight on file to  calculate BMI.  Orders:  Orders Placed This Encounter  Procedures   XR Foot Complete Right   Meds ordered this encounter  Medications   nabumetone (RELAFEN) 750 MG tablet    Sig: Take 1 tablet (750 mg total) by mouth 2 (two) times daily as needed for mild pain or moderate pain. with food    Dispense:  60 tablet    Refill:  3     Procedures: No procedures performed  Clinical Data: No additional findings.  ROS:  All other systems negative, except as noted in the HPI. Review of Systems  Objective: Vital Signs: There were no vitals taken for this visit.  Specialty Comments:  No specialty comments available.  PMFS History: Patient Active Problem List   Diagnosis Date Noted   Posterior tibialis tendon insufficiency    Otitis externa, left 07/25/2021   Acute otalgia, left 07/23/2021   Palpitations 07/09/2021   Hot flashes due to menopause 07/09/2021   IUD (intrauterine device) in place 07/09/2021   Hyperlipemia 07/09/2021   Insomnia 11/13/2013   Degenerative joint disease of cervical spine 11/13/2013   Past Medical History:  Diagnosis Date  COVID 08/2020   mild case   Hot flashes due to menopause 07/09/2021   Hyperlipidemia    IUD (intrauterine device) in place 07/09/2021    Family History  Problem Relation Age of Onset   Stroke Mother    Hypertension Father    Diabetes Father    Brain cancer Sister    Stroke Maternal Grandmother     Past Surgical History:  Procedure Laterality Date   CESAREAN SECTION  2003, 2008   COLONOSCOPY WITH PROPOFOL N/A 11/20/2020   Procedure: COLONOSCOPY WITH PROPOFOL;  Surgeon: Eloise Harman, DO;  [facility];  Service: Endoscopy;  Laterality: N/A;  am/ASA II, pt tested +1/24 thru Health at work <90 - results emailed to [facility] - advised pt current arrival time of 7:00 - needed to know for transportation cy 3/21   [place] N/A 2008   Retained placenta from c-section birth    FOOT ARTHRODESIS Right 10/27/2021   Procedure: RIGHT SUBTALAR AND TALONAVICULAR FUSION;  Surgeon: Newt Minion, MD;  Location: [facility];  Service: Orthopedics;  Laterality: Right;   POLYPECTOMY  11/20/2020   Procedure: POLYPECTOMY;  Surgeon: Eloise Harman, DO;  [facility];  Service: Endoscopy;;   Social History   Occupational History    Comment: XRAY tech  Tobacco Use   Smoking status: Never   Smokeless tobacco: Never  Vaping Use   Vaping Use: Never used  Substance and Sexual Activity   Alcohol use: Yes    Comment: occ   Drug use: No   Sexual activity: Yes    Birth control/protection: I.U.D.

## 2022-02-04 ENCOUNTER — Other Ambulatory Visit (HOSPITAL_COMMUNITY): Payer: Self-pay

## 2022-02-06 ENCOUNTER — Encounter: Payer: Self-pay | Admitting: Orthopedic Surgery

## 2022-02-06 NOTE — Progress Notes (Signed)
Office Visit Note   Patient: Kayla Chaney           Date of Birth: Feb 24, 1976           MRN: [id number] Visit Date: 12/30/2021              Requested by: Renee Rival, FNP [address] [place],  [place] [address] PCP: Renee Rival, FNP  Chief Complaint  Patient presents with   Right Foot - Routine Post Op    Right subtalar and talonavicular fusion      HPI: Patient is a 46 year old woman who presents 2 months status post talonavicular and subtalar fusion on the right for posterior tibial tendon insufficiency.  She is weightbearing as tolerated in a fracture boot.  Assessment & Plan: Visit Diagnoses:  1. PTTD (posterior tibial tendon dysfunction)     Plan: Anticipate return to work on May 24 without restrictions.  Follow-Up Instructions: Return in about 3 weeks (around 01/20/2022).   Ortho Exam  Patient is alert, oriented, no adenopathy, well-dressed, normal affect, normal respiratory effort. The skin is well-healed radiographs show stable alignment.  Patient still has neuropathic pain the Neurontin has helped.  Imaging: No results found. No images are attached to the encounter.  Labs: Lab Results  Component Value Date   LABORGA ESCHERICHIA COLI 10/28/2014     Lab Results  Component Value Date   ALBUMIN 4.4 07/03/2020   ALBUMIN 4.5 08/13/2019   ALBUMIN 4.5 01/25/2019    No results found for: "MG" Lab Results  Component Value Date   VD25OH 38.5 01/25/2019    No results found for: "PREALBUMIN"    Latest Ref Rng & Units 10/27/2021    7:38 AM 08/13/2019    9:36 AM  CBC EXTENDED  WBC 4.0 - 10.5 K/uL 5.7  4.7   RBC 3.87 - 5.11 MIL/uL 4.01  4.30   Hemoglobin 12.0 - 15.0 g/dL 12.6  14.2   HCT 36.0 - 46.0 % 36.7  40.5   Platelets 150 - 400 K/uL 245  203      There is no height or weight on file to calculate BMI.  Orders:  Orders Placed This Encounter  Procedures   XR Foot Complete Right   No orders of the  defined types were placed in this encounter.    Procedures: No procedures performed  Clinical Data: No additional findings.  ROS:  All other systems negative, except as noted in the HPI. Review of Systems  Objective: Vital Signs: There were no vitals taken for this visit.  Specialty Comments:  No specialty comments available.  PMFS History: Patient Active Problem List   Diagnosis Date Noted   Posterior tibialis tendon insufficiency    Otitis externa, left 07/25/2021   Acute otalgia, left 07/23/2021   Palpitations 07/09/2021   Hot flashes due to menopause 07/09/2021   IUD (intrauterine device) in place 07/09/2021   Hyperlipemia 07/09/2021   Insomnia 11/13/2013   Degenerative joint disease of cervical spine 11/13/2013   Past Medical History:  Diagnosis Date   COVID 08/2020   mild case   Hot flashes due to menopause 07/09/2021   Hyperlipidemia    IUD (intrauterine device) in place 07/09/2021    Family History  Problem Relation Age of Onset   Stroke Mother    Hypertension Father    Diabetes Father    Brain cancer Sister    Stroke Maternal Grandmother     Past Surgical History:  Procedure Laterality Date   CESAREAN SECTION  2003, 2008   COLONOSCOPY WITH PROPOFOL N/A 11/20/2020   Procedure: COLONOSCOPY WITH PROPOFOL;  Surgeon: Eloise Harman, DO;  [facility];  Service: Endoscopy;  Laterality: N/A;  am/ASA II, pt tested +1/24 thru Health at work <90 - results emailed to [facility] - advised pt current arrival time of 7:00 - needed to know for transportation cy 3/21   [place] N/A 2008   Retained placenta from c-section birth   FOOT ARTHRODESIS Right 10/27/2021   Procedure: RIGHT SUBTALAR AND TALONAVICULAR FUSION;  Surgeon: Newt Minion, MD;  Location: [facility];  Service: Orthopedics;  Laterality: Right;   POLYPECTOMY  11/20/2020   Procedure: POLYPECTOMY;  Surgeon: Eloise Harman, DO;  [facility];   Service: Endoscopy;;   Social History   Occupational History    Comment: XRAY tech  Tobacco Use   Smoking status: Never   Smokeless tobacco: Never  Vaping Use   Vaping Use: Never used  Substance and Sexual Activity   Alcohol use: Yes    Comment: occ   Drug use: No   Sexual activity: Yes    Birth control/protection: I.U.D.

## 2022-02-16 ENCOUNTER — Other Ambulatory Visit (HOSPITAL_COMMUNITY): Payer: Self-pay

## 2022-02-18 ENCOUNTER — Other Ambulatory Visit (HOSPITAL_COMMUNITY): Payer: Self-pay

## 2022-02-19 ENCOUNTER — Other Ambulatory Visit (HOSPITAL_COMMUNITY): Payer: Self-pay

## 2022-03-03 ENCOUNTER — Encounter: Payer: Self-pay | Admitting: Orthopedic Surgery

## 2022-03-04 ENCOUNTER — Other Ambulatory Visit: Payer: Self-pay

## 2022-03-04 DIAGNOSIS — M76829 Posterior tibial tendinitis, unspecified leg: Secondary | ICD-10-CM

## 2022-03-04 DIAGNOSIS — S86111A Strain of other muscle(s) and tendon(s) of posterior muscle group at lower leg level, right leg, initial encounter: Secondary | ICD-10-CM

## 2022-03-13 ENCOUNTER — Other Ambulatory Visit: Payer: Self-pay | Admitting: Orthopedic Surgery

## 2022-03-14 ENCOUNTER — Other Ambulatory Visit (HOSPITAL_COMMUNITY): Payer: Self-pay

## 2022-03-14 MED ORDER — GABAPENTIN 300 MG PO CAPS
300.0000 mg | ORAL_CAPSULE | Freq: Four times a day (QID) | ORAL | 3 refills | Status: DC
  Filled 2022-03-14: qty 120, 30d supply, fill #0
  Filled 2022-06-26: qty 120, 30d supply, fill #1

## 2022-04-01 ENCOUNTER — Encounter: Payer: Self-pay | Admitting: Orthopedic Surgery

## 2022-04-01 ENCOUNTER — Other Ambulatory Visit (HOSPITAL_COMMUNITY): Payer: Self-pay

## 2022-04-01 MED ORDER — GABAPENTIN 100 MG PO CAPS
100.0000 mg | ORAL_CAPSULE | Freq: Three times a day (TID) | ORAL | 1 refills | Status: DC
  Filled 2022-04-01: qty 90, 30d supply, fill #0

## 2022-04-06 ENCOUNTER — Ambulatory Visit (HOSPITAL_COMMUNITY): Payer: No Typology Code available for payment source | Attending: Orthopedic Surgery

## 2022-04-06 DIAGNOSIS — M76829 Posterior tibial tendinitis, unspecified leg: Secondary | ICD-10-CM | POA: Insufficient documentation

## 2022-04-06 DIAGNOSIS — S86111A Strain of other muscle(s) and tendon(s) of posterior muscle group at lower leg level, right leg, initial encounter: Secondary | ICD-10-CM | POA: Diagnosis present

## 2022-04-06 DIAGNOSIS — R262 Difficulty in walking, not elsewhere classified: Secondary | ICD-10-CM | POA: Insufficient documentation

## 2022-04-06 DIAGNOSIS — M76821 Posterior tibial tendinitis, right leg: Secondary | ICD-10-CM | POA: Insufficient documentation

## 2022-04-06 NOTE — Therapy (Signed)
OUTPATIENT PHYSICAL THERAPY LOWER EXTREMITY EVALUATION   Patient Name: Kayla IGOE MRN: [id number] DOB:06-09-1976, 46 y.o., female Today's Date: 04/06/2022   PT End of Session - 04/06/22 1607     Visit Number 1    Number of Visits 4    Date for PT Re-Evaluation 05/18/22    Authorization Type [employer]; $40.00 copay, no deduct, 25 visits and over requires medical review    Authorization - Number of Visits 25    Progress Note Due on Visit 4    PT Start Time 0400    PT Stop Time 0444    PT Time Calculation (min) 44 min             Past Medical History:  Diagnosis Date   COVID 08/2020   mild case   Hot flashes due to menopause 07/09/2021   Hyperlipidemia    IUD (intrauterine device) in place 07/09/2021   Past Surgical History:  Procedure Laterality Date   CESAREAN SECTION  2003, 2008   COLONOSCOPY WITH PROPOFOL N/A 11/20/2020   Procedure: COLONOSCOPY WITH PROPOFOL;  Surgeon: Eloise Harman, DO;  [facility];  Service: Endoscopy;  Laterality: N/A;  am/ASA II, pt tested +1/24 thru Health at work <90 - results emailed to [facility] - advised pt current arrival time of 7:00 - needed to know for transportation cy 3/21   [place] N/A 2008   Retained placenta from c-section birth   FOOT ARTHRODESIS Right 10/27/2021   Procedure: RIGHT SUBTALAR AND TALONAVICULAR FUSION;  Surgeon: Newt Minion, MD;  Location: [facility];  Service: Orthopedics;  Laterality: Right;   POLYPECTOMY  11/20/2020   Procedure: POLYPECTOMY;  Surgeon: Eloise Harman, DO;  [facility];  Service: Endoscopy;;   Patient Active Problem List   Diagnosis Date Noted   Posterior tibialis tendon insufficiency    Otitis externa, left 07/25/2021   Acute otalgia, left 07/23/2021   Palpitations 07/09/2021   Hot flashes due to menopause 07/09/2021   IUD (intrauterine device) in place 07/09/2021   Hyperlipemia 07/09/2021   Insomnia 11/13/2013    Degenerative joint disease of cervical spine 11/13/2013    PCP: Renee Rival, FNP  REFERRING PROVIDER: Meridee Score, MD  REFERRING DIAG: M76.829 (ICD-10-CM) - PTTD (posterior tibial tendon dysfunction) Y10.175Z (ICD-10-CM) - Traumatic rupture of right posterior tibial tendon, initial encounter Pt eval and treat s/p right subtalar and talonavicular fusion   THERAPY DIAG:  Posterior tibial tendinitis of right lower extremity  Difficulty in walking, not elsewhere classified  Rupture of right posterior tibialis tendon, initial encounter  Rationale for Evaluation and Treatment Rehabilitation  ONSET DATE: March 1st, 2023  SUBJECTIVE:   SUBJECTIVE STATEMENT: M76.829 (ICD-10-CM) - PTTD (posterior tibial tendon dysfunction) W25.852D (ICD-10-CM) - Traumatic rupture of right posterior tibial tendon, initial encounter Pt eval and treat s/p right subtalar and talonavicular fusion   Ankle has bothered her for a long time; ruptured it at [place]; felt a pop maybe in Jan; referred to Dr. Sharol Given. Surgery 10/27/21   PERTINENT HISTORY: none  PAIN:  Are you having pain? Yes: NPRS scale: 4/10 Pain location: lateral right ankle, deep Pain description: feels like it's going to break Aggravating factors: sitting still Relieving factors: ice,medication  PRECAUTIONS: Fall  WEIGHT BEARING RESTRICTIONS No  FALLS:  Has patient fallen in last 6 months? No  LIVING ENVIRONMENT: Lives with: lives with their family Lives in: House/apartment Stairs: No Has following equipment at home: Single  point cane, shower chair, and Grab bars  OCCUPATION: works for [employer]: Independent  PATIENT GOALS :  confidence with steps, get rid of my limp   OBJECTIVE:   DIAGNOSTIC FINDINGS: Three-view radiographs of the right foot shows stable internal fixation without hardware lucency without hardware failure.  PATIENT SURVEYS:  FOTO 63  COGNITION:  Overall cognitive status: Within functional  limits for tasks assessed     SENSATION: altered sensation medial foot   EDEMA:  Figure 8: right 54 cm,  left 49.5 cm   PALPATION: No complaint of pain on palpation  LOWER EXTREMITY ROM:  Active ROM Right eval Left eval  Hip flexion    Hip extension    Hip abduction    Hip adduction    Hip internal rotation    Hip external rotation    Knee flexion    Knee extension    Ankle dorsiflexion 4 8  Ankle plantarflexion 35 52  Ankle inversion 6 42  Ankle eversion 4 10   (Blank rows = not tested)  LOWER EXTREMITY MMT:  MMT Right eval Left eval  Hip flexion    Hip extension    Hip abduction    Hip adduction    Hip internal rotation    Hip external rotation    Knee flexion    Knee extension    Ankle dorsiflexion 4- 5  Ankle plantarflexion 3- 5  Ankle inversion 4- 5  Ankle eversion 4- 5   (Blank rows = not tested)   TODAY'S TREATMENT: Physical therapy evaluation, HEP instruction   PATIENT EDUCATION:  Education details: Patient educated on exam findings, POC, scope of PT, HEP. Person educated: Patient Education method: Explanation, Demonstration, and Handouts Education comprehension: verbalized understanding, returned demonstration, verbal cues required, and tactile cues required  HOME EXERCISE PROGRAM: Access Code: [email or web address] URL: [email or web address] Date: 04/06/2022 Prepared by: [facility]  Exercises - Seated Heel Slide  - 2 x daily - 7 x weekly - 1 sets - 10 reps - Long Sitting Ankle Eversion with Resistance  - 2 x daily - 7 x weekly - 1 sets - 10 reps - Long Sitting Ankle Plantar Flexion with Resistance  - 2 x daily - 7 x weekly - 1 sets - 10 reps - Long Sitting Ankle Inversion with Resistance  - 2 x daily - 7 x weekly - 1 sets - 10 reps - Ankle Dorsiflexion with Resistance  - 2 x daily - 7 x weekly - 1 sets - 10 reps - Standing Dorsiflexion Self-Mobilization on Step  - 2 x daily - 7 x weekly - 1 sets - 10 reps  ASSESSMENT:  CLINICAL  IMPRESSION: Patient is a 46 y.o. female who was seen today for physical therapy evaluation and treatment for Pt eval and treat s/p right subtalar and talonavicular fusion  Patient presents with limited ankle mobility, decreased ankle strength, antalgic gait all which negatively impact her ability to exercise for fitness, navigate steps and walk efficiently.  Patient will benefit from skilled physical therapy interventions to address deficits and promote optimal function.    OBJECTIVE IMPAIRMENTS Abnormal gait, decreased activity tolerance, decreased balance, decreased coordination, decreased endurance, decreased mobility, difficulty walking, decreased ROM, decreased strength, hypomobility, increased edema, increased fascial restrictions, impaired perceived functional ability, impaired flexibility, impaired sensation, and pain.   ACTIVITY LIMITATIONS lifting, bending, sitting, standing, squatting, stairs, transfers, and locomotion level  PARTICIPATION LIMITATIONS: shopping, community activity, occupation, and yard work  PERSONAL FACTORS Fitness are  also affecting patient's functional outcome. Patient very active   REHAB POTENTIAL: Good  CLINICAL DECISION MAKING: Stable/uncomplicated  EVALUATION COMPLEXITY: Low   GOALS: Goals reviewed with patient? No  SHORT TERM GOALS: Target date: 04/27/2022   patient will be independent with initial HEP  Baseline: Goal status: INITIAL  2.  Patient will increase total right ankle AROM by 10 degrees to improve ability to navigate steps and uneven surfaces safely Baseline: see ROM Goal status: INITIAL LONG TERM GOALS: Target date: 05/18/2022   Patient will be independent in self management strategies to improve quality of life and functional outcomes.  Baseline:  Goal status: INITIAL  2.  Patient will report at least 50% improvement in overall symptoms and/or function to demonstrate improved functional mobility  Baseline:  Goal status:  INITIAL  3.  Patient will improve FOTO score to predicted value to demonstrate improved functional mobility  Baseline: 63 Goal status: INITIAL  4.  Patient will walk x 350 ft in 2 minutes to demonstrate improved functional mobility and ability to walk for fitness.  Baseline:  Goal status: INITIAL  5.  Patient will increase right ankle MMT's to 4+-5/5 to  promote return to ambulation community distances with minimal deviation.  Baseline:  Goal status: INITIAL  PLAN: PT FREQUENCY: 1x/week  PT DURATION: other: every other week  PLANNED INTERVENTIONS: Therapeutic exercises, Therapeutic activity, Neuromuscular re-education, Balance training, Gait training, Patient/Family education, Joint manipulation, Joint mobilization, Stair training, Orthotic/Fit training, DME instructions, Aquatic Therapy, Dry Needling, Electrical stimulation, Spinal manipulation, Spinal mobilization, Cryotherapy, Moist heat, Compression bandaging, scar mobilization, Splintting, Taping, Traction, Ultrasound, Ionotophoresis '4mg'$ /ml Dexamethasone, and Manual therapy   PLAN FOR NEXT SESSION: Review of HEP and goals; update HEP as patient is only coming 1 time a week every other week due to her work schedule.    5:49 PM, 04/06/22 Caydee Small Rebekah Zackery MPT [facility] [place] [phone]

## 2022-04-07 ENCOUNTER — Other Ambulatory Visit (HOSPITAL_COMMUNITY): Payer: Self-pay | Admitting: Nurse Practitioner

## 2022-04-07 DIAGNOSIS — Z1231 Encounter for screening mammogram for malignant neoplasm of breast: Secondary | ICD-10-CM

## 2022-04-08 ENCOUNTER — Encounter (HOSPITAL_COMMUNITY): Payer: No Typology Code available for payment source | Admitting: Physical Therapy

## 2022-04-09 ENCOUNTER — Other Ambulatory Visit (HOSPITAL_COMMUNITY): Payer: Self-pay

## 2022-04-13 ENCOUNTER — Encounter (HOSPITAL_COMMUNITY): Payer: No Typology Code available for payment source

## 2022-04-15 ENCOUNTER — Inpatient Hospital Stay (HOSPITAL_COMMUNITY): Admission: RE | Admit: 2022-04-15 | Payer: No Typology Code available for payment source | Source: Ambulatory Visit

## 2022-04-15 ENCOUNTER — Encounter (HOSPITAL_COMMUNITY): Payer: No Typology Code available for payment source

## 2022-04-15 DIAGNOSIS — Z1231 Encounter for screening mammogram for malignant neoplasm of breast: Secondary | ICD-10-CM

## 2022-04-20 ENCOUNTER — Encounter (HOSPITAL_COMMUNITY): Payer: No Typology Code available for payment source | Admitting: Physical Therapy

## 2022-04-22 ENCOUNTER — Ambulatory Visit (HOSPITAL_COMMUNITY): Payer: No Typology Code available for payment source

## 2022-04-27 ENCOUNTER — Other Ambulatory Visit: Payer: Self-pay | Admitting: Family

## 2022-04-27 ENCOUNTER — Other Ambulatory Visit (HOSPITAL_COMMUNITY): Payer: Self-pay

## 2022-04-27 DIAGNOSIS — M5030 Other cervical disc degeneration, unspecified cervical region: Secondary | ICD-10-CM

## 2022-04-27 MED ORDER — METHOCARBAMOL 500 MG PO TABS
ORAL_TABLET | Freq: Three times a day (TID) | ORAL | 3 refills | Status: DC | PRN
  Filled 2022-04-27: qty 90, 30d supply, fill #0
  Filled 2022-06-16: qty 90, 30d supply, fill #1
  Filled 2022-09-03: qty 90, 30d supply, fill #2
  Filled 2022-10-20: qty 90, 30d supply, fill #3

## 2022-04-29 ENCOUNTER — Other Ambulatory Visit (HOSPITAL_COMMUNITY): Payer: Self-pay

## 2022-05-04 ENCOUNTER — Ambulatory Visit (HOSPITAL_COMMUNITY): Payer: No Typology Code available for payment source | Attending: Orthopedic Surgery | Admitting: Physical Therapy

## 2022-05-04 DIAGNOSIS — S86111A Strain of other muscle(s) and tendon(s) of posterior muscle group at lower leg level, right leg, initial encounter: Secondary | ICD-10-CM | POA: Insufficient documentation

## 2022-05-04 DIAGNOSIS — M76821 Posterior tibial tendinitis, right leg: Secondary | ICD-10-CM | POA: Insufficient documentation

## 2022-05-04 DIAGNOSIS — R262 Difficulty in walking, not elsewhere classified: Secondary | ICD-10-CM | POA: Insufficient documentation

## 2022-05-04 NOTE — Therapy (Signed)
OUTPATIENT PHYSICAL THERAPY TREATMENT Patient Name: Kayla Chaney MRN: [id number] DOB:[birth year], 46 y.o., female Today's Date: 05/04/2022   PT End of Session - 05/04/22 1650     Visit Number 2    Number of Visits 4    Date for PT Re-Evaluation 05/18/22    Authorization Type [employer] [employer]; $40.00 copay, no deduct, 25 visits and over requires medical review    Authorization - Number of Visits 25    Progress Note Due on Visit 4    PT Start Time 1648    PT Stop Time 1728    PT Time Calculation (min) 40 min             Past Medical History:  Diagnosis Date   COVID 08/2020   mild case   Hot flashes due to menopause 07/09/2021   Hyperlipidemia    IUD (intrauterine device) in place 07/09/2021   Past Surgical History:  Procedure Laterality Date   CESAREAN SECTION  2003, 2008   COLONOSCOPY WITH PROPOFOL N/A 11/20/2020   Procedure: COLONOSCOPY WITH PROPOFOL;  Surgeon: Eloise Harman, DO;  [facility];  Service: Endoscopy;  Laterality: N/A;  am/ASA II, pt tested +1/24 thru [employer] at work <90 - results emailed to [place] - advised pt current arrival time of 7:00 - needed to know for transportation cy 3/21   [place] N/A 2008   Retained placenta from c-section birth   FOOT ARTHRODESIS Right 10/27/2021   Procedure: RIGHT SUBTALAR AND TALONAVICULAR FUSION;  Surgeon: Newt Minion, MD;  Location: [place];  Service: Orthopedics;  Laterality: Right;   POLYPECTOMY  11/20/2020   Procedure: POLYPECTOMY;  Surgeon: Eloise Harman, DO;  [facility];  Service: Endoscopy;;   Patient Active Problem List   Diagnosis Date Noted   Posterior tibialis tendon insufficiency    Otitis externa, left 07/25/2021   Acute otalgia, left 07/23/2021   Palpitations 07/09/2021   Hot flashes due to menopause 07/09/2021   IUD (intrauterine device) in place 07/09/2021   Hyperlipemia 07/09/2021   Insomnia 11/13/2013   Degenerative joint disease  of cervical spine 11/13/2013    PCP: Renee Rival, FNP  REFERRING PROVIDER: Meridee Score, MD  REFERRING DIAG: M76.829 (ICD-10-CM) - PTTD (posterior tibial tendon dysfunction) D14.970Y (ICD-10-CM) - Traumatic rupture of right posterior tibial tendon, initial encounter Pt eval and treat s/p right subtalar and talonavicular fusion   THERAPY DIAG:  No diagnosis found.  Rationale for Evaluation and Treatment Rehabilitation  ONSET DATE: March 1st, 2023  SUBJECTIVE:   SUBJECTIVE STATEMENT: Pt states she has been unable to attend due to scheduling and illness.  [place] and was able to complete with a little soreness.  States her ankle is stiff after the first couple steps after sitting but overall getting much better.  Currently pain is 2/10.  States she has been compliant with the band exercises and stretches.   From evaluation: M76.829 (ICD-10-CM) - PTTD (posterior tibial tendon dysfunction) O37.858I (ICD-10-CM) - Traumatic rupture of right posterior tibial tendon, initial encounter Pt eval and treat s/p right subtalar and talonavicular fusion  Ankle has bothered her for a long time; ruptured it at [place]; felt a pop maybe in Jan; referred to Dr. Sharol Given. Surgery 10/27/21   PERTINENT HISTORY: 10/27/21:  Rt subtalar and talonavicular fusion  PAIN:  Are you having pain? Yes: NPRS scale: 2/10  Pain location: lateral right ankle, deep Pain description: feels like it's going to break Aggravating factors: sitting still Relieving factors: ice,medication  PRECAUTIONS: Fall  WEIGHT BEARING RESTRICTIONS No  FALLS:  Has patient fallen in last 6 months? No  LIVING ENVIRONMENT: Lives with: lives with their family Lives in: House/apartment Stairs: No Has following equipment at home: Single point cane, shower chair, and Grab bars  OCCUPATION: works for [employer]: Independent  PATIENT GOALS :  confidence with  steps, get rid of my limp   OBJECTIVE:   DIAGNOSTIC FINDINGS: Three-view radiographs of the right foot shows stable internal fixation without hardware lucency without hardware failure.  PATIENT SURVEYS:  FOTO 63  COGNITION:  Overall cognitive status: Within functional limits for tasks assessed     SENSATION: altered sensation medial foot   EDEMA:  Figure 8: right 54 cm,  left 49.5 cm   PALPATION: No complaint of pain on palpation  LOWER EXTREMITY ROM:  Active ROM Right eval Right 05/04/22 Left eval  Hip flexion     Hip extension     Hip abduction     Hip adduction     Hip internal rotation     Hip external rotation     Knee flexion     Knee extension     Ankle dorsiflexion _0 Ankle plantarflexion 35 35 52  Ankle inversion 6 28 42  Ankle eversion _1 (Blank rows = not tested)  LOWER EXTREMITY MMT:  MMT Right eval Right 05/04/22 Left eval  Hip flexion     Hip extension     Hip abduction     Hip adduction     Hip internal rotation     Hip external rotation     Knee flexion     Knee extension     Ankle dorsiflexion 4- 4+ 5  Ankle plantarflexion 3- 4+ 5  Ankle inversion 4- 4 5  Ankle eversion 4- 4 5   (Blank rows = not tested)   TODAY'S TREATMENT:  05/04/22  Goal and HEP review  Standing: Heelraises 10X Toeraises 10X Tandem stance 2X30" no UE assist SLS 8" is max for Rt LE without UE assist Forward lunge onto 6" step no UE assist 10X Step stretch (plantar fascia) 3X20"   Evaluation: Physical therapy evaluation, HEP instruction   PATIENT EDUCATION:  Education details: Patient educated on exam findings, POC, scope of PT, HEP. Person educated: Patient Education method: Explanation, Demonstration, and Handouts Education comprehension: verbalized understanding, returned demonstration, verbal cues required, and tactile cues required  HOME EXERCISE PROGRAM: Access Code: [email or web address] URL: [email or web address] Date:  04/06/2022 Prepared by: [facility]  Exercises - Seated Heel Slide  - 2 x daily - 7 x weekly - 1 sets - 10 reps - Long Sitting Ankle Eversion with Resistance  - 2 x daily - 7 x weekly - 1 sets - 10 reps - Long Sitting Ankle Plantar Flexion with Resistance  - 2 x daily - 7 x weekly - 1 sets - 10 reps - Long Sitting Ankle Inversion with Resistance  - 2 x daily - 7 x weekly - 1 sets - 10 reps - Ankle Dorsiflexion with Resistance  - 2 x daily - 7 x weekly - 1 sets - 10 reps - Standing Dorsiflexion Self-Mobilization on Step  - 2 x daily - 7 x weekly - 1 sets - 10 reps  ASSESSMENT:  CLINICAL IMPRESSION: Pt returns today following evaluation  approx 1 month prior.  Goals and HEP reviewed with patient and progressed activity and challenge level this session.  Standing exercises initiated including stair stretch, AROM and balance activities.   ROM and strength re measured with noted improvement as compared to initial evaluation (see above graph).  Cues to utilize heel to toe gait as continues to present antalgic gait.  Patient will benefit from skilled physical therapy interventions to address deficits and promote optimal function.    OBJECTIVE IMPAIRMENTS Abnormal gait, decreased activity tolerance, decreased balance, decreased coordination, decreased endurance, decreased mobility, difficulty walking, decreased ROM, decreased strength, hypomobility, increased edema, increased fascial restrictions, impaired perceived functional ability, impaired flexibility, impaired sensation, and pain.   ACTIVITY LIMITATIONS lifting, bending, sitting, standing, squatting, stairs, transfers, and locomotion level  PARTICIPATION LIMITATIONS: shopping, community activity, occupation, and yard work  PERSONAL FACTORS Fitness are also affecting patient's functional outcome. Patient very active   REHAB POTENTIAL: Good  CLINICAL DECISION MAKING: Stable/uncomplicated  EVALUATION COMPLEXITY: Low   GOALS: Goals reviewed  with patient? Yes  SHORT TERM GOALS: Target date: 04/27/2022   patient will be independent with initial HEP  Baseline: Goal status: MET  2.  Patient will increase total right ankle AROM by 10 degrees to improve ability to navigate steps and uneven surfaces safely Baseline: see ROM Goal status: IN PROGRESS  LONG TERM GOALS: Target date: 06/15/2022   Patient will be independent in self management strategies to improve quality of life and functional outcomes.  Baseline:  Goal status: IN PROGRESS  2.  Patient will report at least 50% improvement in overall symptoms and/or function to demonstrate improved functional mobility  Baseline:  Goal status: IN PROGRESS  3.  Patient will improve FOTO score to predicted value to demonstrate improved functional mobility  Baseline: 63 Goal status: IN PROGRESS  4.  Patient will walk x 350 ft in 2 minutes to demonstrate improved functional mobility and ability to walk for fitness.  Baseline:  Goal status: IN PROGRESS  5.  Patient will increase right ankle MMT's to 4+-5/5 to  promote return to ambulation community distances with minimal deviation.  Baseline:  Goal status: IN PROGRESS  PLAN: PT FREQUENCY: 1x/week  PT DURATION: other: every other week  PLANNED INTERVENTIONS: Therapeutic exercises, Therapeutic activity, Neuromuscular re-education, Balance training, Gait training, Patient/Family education, Joint manipulation, Joint mobilization, Stair training, Orthotic/Fit training, DME instructions, Aquatic Therapy, Dry Needling, Electrical stimulation, Spinal manipulation, Spinal mobilization, Cryotherapy, Moist heat, Compression bandaging, scar mobilization, Splintting, Taping, Traction, Ultrasound, Ionotophoresis 34m/ml Dexamethasone, and Manual therapy   PLAN FOR NEXT SESSION:   Progress and update HEP as patient is only coming 1 time a week every other week due to her work schedule.    4:50 PM, 05/04/22 ATeena Irani PTA/CLT CStellaPh: [phone]

## 2022-05-13 ENCOUNTER — Encounter (HOSPITAL_COMMUNITY): Payer: Self-pay

## 2022-05-13 ENCOUNTER — Ambulatory Visit (HOSPITAL_COMMUNITY): Payer: No Typology Code available for payment source

## 2022-05-13 DIAGNOSIS — M76821 Posterior tibial tendinitis, right leg: Secondary | ICD-10-CM

## 2022-05-13 DIAGNOSIS — S86111A Strain of other muscle(s) and tendon(s) of posterior muscle group at lower leg level, right leg, initial encounter: Secondary | ICD-10-CM

## 2022-05-13 DIAGNOSIS — R262 Difficulty in walking, not elsewhere classified: Secondary | ICD-10-CM

## 2022-05-13 NOTE — Therapy (Signed)
OUTPATIENT PHYSICAL THERAPY TREATMENT Patient Name: Kayla Chaney MRN: [id number] DOB:10-30-1975, 46 y.o., female Today's Date: 05/13/2022   PT End of Session - 05/13/22 0817     Visit Number 3    Number of Visits 4    Date for PT Re-Evaluation 05/18/22    Authorization Type [employer]; $40.00 copay, no deduct, 25 visits and over requires medical review    Authorization - Number of Visits 25    Progress Note Due on Visit 4    PT Start Time 0734    PT Stop Time 0813    PT Time Calculation (min) 39 min    Activity Tolerance Patient tolerated treatment well    Behavior During Therapy [facility] for tasks assessed/performed              Past Medical History:  Diagnosis Date   COVID 08/2020   mild case   Hot flashes due to menopause 07/09/2021   Hyperlipidemia    IUD (intrauterine device) in place 07/09/2021   Past Surgical History:  Procedure Laterality Date   CESAREAN SECTION  2003, 2008   COLONOSCOPY WITH PROPOFOL N/A 11/20/2020   Procedure: COLONOSCOPY WITH PROPOFOL;  Surgeon: Eloise Harman, DO;  [facility];  Service: Endoscopy;  Laterality: N/A;  am/ASA II, pt tested +1/24 thru [employer] at work <90 - results emailed to [place] - advised pt current arrival time of 7:00 - needed to know for transportation cy 3/21   [place] N/A 2008   Retained placenta from c-section birth   FOOT ARTHRODESIS Right 10/27/2021   Procedure: RIGHT SUBTALAR AND TALONAVICULAR FUSION;  Surgeon: Newt Minion, MD;  Location: [place];  Service: Orthopedics;  Laterality: Right;   POLYPECTOMY  11/20/2020   Procedure: POLYPECTOMY;  Surgeon: Eloise Harman, DO;  [facility];  Service: Endoscopy;;   Patient Active Problem List   Diagnosis Date Noted   Posterior tibialis tendon insufficiency    Otitis externa, left 07/25/2021   Acute otalgia, left 07/23/2021   Palpitations 07/09/2021   Hot flashes due to menopause 07/09/2021   IUD  (intrauterine device) in place 07/09/2021   Hyperlipemia 07/09/2021   Insomnia 11/13/2013   Degenerative joint disease of cervical spine 11/13/2013    PCP: Renee Rival, FNP  REFERRING PROVIDER: Meridee Score, MD  REFERRING DIAG: M76.829 (ICD-10-CM) - PTTD (posterior tibial tendon dysfunction) G18.299B (ICD-10-CM) - Traumatic rupture of right posterior tibial tendon, initial encounter Pt eval and treat s/p right subtalar and talonavicular fusion   THERAPY DIAG:  Posterior tibial tendinitis of right lower extremity  Difficulty in walking, not elsewhere classified  Rupture of right posterior tibialis tendon, initial encounter  Rationale for Evaluation and Treatment Rehabilitation  ONSET DATE: March 1st, 2023  SUBJECTIVE:   SUBJECTIVE STATEMENT: Pt stated she can tell she is making progress.  Continues to feel weak with gastroc musculature but feels improvements by 70% since initial eval.  Reoprts compliance with HEP daily and gets between 8-10,000 steps per day.  Reports most difficulty with descending stairs.    From evaluation: M76.829 (ICD-10-CM) - PTTD (posterior tibial tendon dysfunction) Z16.967E (ICD-10-CM) - Traumatic rupture of right posterior tibial tendon, initial encounter Pt eval and treat s/p right subtalar and talonavicular fusion  Ankle has bothered her for a long time; ruptured it at [place]; felt a pop maybe in Jan; referred to Dr. Sharol Given. Surgery 10/27/21   PERTINENT HISTORY: 10/27/21:  Rt subtalar and talonavicular fusion  PAIN:  Are you having pain? Yes: NPRS scale: 2/10 Pain location: lateral right ankle, deep Pain description: feels like it's going to break Aggravating factors: sitting still Relieving factors: ice,medication  PRECAUTIONS: Fall  WEIGHT BEARING RESTRICTIONS No  FALLS:  Has patient fallen in last 6 months? No  LIVING ENVIRONMENT: Lives with: lives with their family Lives in: House/apartment Stairs: No Has following equipment at  home: Single point cane, shower chair, and Grab bars  OCCUPATION: works for [employer]: Independent  PATIENT GOALS :  confidence with steps, get rid of my limp   OBJECTIVE:   DIAGNOSTIC FINDINGS: Three-view radiographs of the right foot shows stable internal fixation without hardware lucency without hardware failure.  PATIENT SURVEYS:  FOTO 63  COGNITION:  Overall cognitive status: Within functional limits for tasks assessed     SENSATION: altered sensation medial foot   EDEMA:  Figure 8: right 54 cm,  left 49.5 cm   PALPATION: No complaint of pain on palpation  LOWER EXTREMITY ROM:  Active ROM Right eval Right 05/04/22 Left eval Right 05/13/22  Hip flexion      Hip extension      Hip abduction      Hip adduction      Hip internal rotation      Hip external rotation      Knee flexion      Knee extension      Ankle dorsiflexion _0 Ankle plantarflexion 35 35 52 38  Ankle inversion 6 28 42 28  Ankle eversion _1 (Blank rows = not tested)  LOWER EXTREMITY MMT:  MMT Right eval Right 05/04/22 Left eval Right  05/13/22  Hip flexion      Hip extension      Hip abduction      Hip adduction      Hip internal rotation      Hip external rotation      Knee flexion      Knee extension      Ankle dorsiflexion 4- 4+ 5 4+  Ankle plantarflexion 3- 4+ 5 4/5  Ankle inversion 4- 4 5 4+  Ankle eversion 4- 4 5 4+   (Blank rows = not tested)   TODAY'S TREATMENT:  05/13/22:  2MWT 473f  Heel raise split stance Rt LE weight bearing Toe raises 15x 5" decline slope  FOTO  ROM   MMT  SLS Lt 30" Rt 11" max of 3  Vector 3x 5"  Reciprocal pattern 7in step 7RT  05/04/22  Goal and HEP review  Standing: Heelraises 10X Toeraises 10X Tandem stance 2X30" no UE assist SLS 8" is max for Rt LE without UE assist Forward lunge onto 6" step no UE assist 10X Step stretch (plantar fascia) 3X20"   Evaluation: Physical therapy evaluation, HEP  instruction   PATIENT EDUCATION:  Education details: Patient educated on exam findings, POC, scope of PT, HEP. Person educated: Patient Education method: Explanation, Demonstration, and Handouts Education comprehension: verbalized understanding, returned demonstration, verbal cues required, and tactile cues required  HOME EXERCISE PROGRAM: Access Code: [email or web address] URL: [email or web address] Date: 04/06/2022 Prepared by: [facility]  Exercises - Seated Heel Slide  - 2 x daily - 7 x weekly - 1 sets - 10 reps - Long Sitting Ankle Eversion with Resistance  - 2 x daily - 7 x weekly - 1 sets - 10 reps - Long Sitting Ankle Plantar Flexion with Resistance  - 2  x daily - 7 x weekly - 1 sets - 10 reps - Long Sitting Ankle Inversion with Resistance  - 2 x daily - 7 x weekly - 1 sets - 10 reps - Ankle Dorsiflexion with Resistance  - 2 x daily - 7 x weekly - 1 sets - 10 reps - Standing Dorsiflexion Self-Mobilization on Step  - 2 x daily - 7 x weekly - 1 sets - 10 reps  05/13/22:  Split stance heel raise   Vector stance  ASSESSMENT:  CLINICAL IMPRESSION: Reviewed goals per cert due and pt has made great progress.  Reports compliance with HEP and feels she has improved by 70%.  FOTO score improved from 63 to 75%.  Pt presents with improved cadence and equal stance phase during 2MWT.  Strength has improved though continues to demonstrate weak gastrocnemius muscuature and difficulty with SLS and descending stairs.  Progressed strengthening with HEP with additional split stance heel raise and vector stance for hip stability.  Pt would like to continue PT 1 more session to address deficits unmet.  OBJECTIVE IMPAIRMENTS Abnormal gait, decreased activity tolerance, decreased balance, decreased coordination, decreased endurance, decreased mobility, difficulty walking, decreased ROM, decreased strength, hypomobility, increased edema, increased fascial restrictions, impaired perceived functional  ability, impaired flexibility, impaired sensation, and pain.   ACTIVITY LIMITATIONS lifting, bending, sitting, standing, squatting, stairs, transfers, and locomotion level  PARTICIPATION LIMITATIONS: shopping, community activity, occupation, and yard work  PERSONAL FACTORS Fitness are also affecting patient's functional outcome. Patient very active   REHAB POTENTIAL: Good  CLINICAL DECISION MAKING: Stable/uncomplicated  EVALUATION COMPLEXITY: Low   GOALS: Goals reviewed with patient? Yes  SHORT TERM GOALS: Target date: 04/27/2022   patient will be independent with initial HEP Baseline: Goal status: MET  2.  Patient will increase total right ankle AROM by 10 degrees to improve ability to navigate steps and uneven surfaces safely Baseline: see ROM Goal status: MET  LONG TERM GOALS: Target date: 06/24/2022   Patient will be independent in self management strategies to improve quality of life and functional outcomes. Baseline: 05/13/22: Reports ice massage  Goal status: IN PROGRESS  2.  Patient will report at least 50% improvement in overall symptoms and/or function to demonstrate improved functional mobility  Baseline: 05/13/22:  Reports 70% improvements Goal status: MET  3.  Patient will improve FOTO score to predicted value to demonstrate improved functional mobility  Baseline: 63 05/13/22: FOTO 75% (FOTO predicated 79) Goal status: IN PROGRESS  4.  Patient will walk x 350 ft in 2 minutes to demonstrate improved functional mobility and ability to walk for fitness.  Baseline: 05/13/22: 2MWT 478f good gait mechanics and no AD Goal status: MET  5.  Patient will increase right ankle MMT's to 4+-5/5 to  promote return to ambulation community distances with minimal deviation.  Baseline: 9/15: see MMT Goal status: IN PROGRESS  PLAN: PT FREQUENCY: 1x/week  PT DURATION: other: every other week  PLANNED INTERVENTIONS: Therapeutic exercises, Therapeutic activity,  Neuromuscular re-education, Balance training, Gait training, Patient/Family education, Joint manipulation, Joint mobilization, Stair training, Orthotic/Fit training, DME instructions, Aquatic Therapy, Dry Needling, Electrical stimulation, Spinal manipulation, Spinal mobilization, Cryotherapy, Moist heat, Compression bandaging, scar mobilization, Splintting, Taping, Traction, Ultrasound, Ionotophoresis 472mml Dexamethasone, and Manual therapy   PLAN FOR NEXT SESSION:   Progress and update HEP as patient is only coming 1 time a week every other week due to her work schedule.  Continue PT 1 more session with focus on descending stairs, gastroc strength and  SLS.  Continue to build HEP prior DC to HEP.  Ihor Austin, LPTA/CLT; CBIS [phone]  8:17 AM, 05/13/22 11:26 AM, 05/17/22 Quanita Small Lynch MPT [facility] [place] [phone]

## 2022-05-17 NOTE — Addendum Note (Signed)
Addended byDonnal Debar, Iqra S on: 05/17/2022 11:28 AM   Modules accepted: Orders

## 2022-05-17 NOTE — Addendum Note (Signed)
Addended byKarn Cassis S on: 05/17/2022 11:33 AM   Modules accepted: Orders

## 2022-05-18 ENCOUNTER — Other Ambulatory Visit: Payer: Self-pay | Admitting: Orthopedic Surgery

## 2022-05-19 ENCOUNTER — Other Ambulatory Visit (HOSPITAL_COMMUNITY): Payer: Self-pay

## 2022-05-19 MED ORDER — NABUMETONE 750 MG PO TABS
750.0000 mg | ORAL_TABLET | Freq: Two times a day (BID) | ORAL | 3 refills | Status: DC | PRN
  Filled 2022-05-19: qty 60, 30d supply, fill #0
  Filled 2022-06-16: qty 60, 30d supply, fill #1
  Filled 2022-09-03: qty 60, 30d supply, fill #2
  Filled 2022-10-20: qty 60, 30d supply, fill #3

## 2022-06-01 ENCOUNTER — Encounter (HOSPITAL_COMMUNITY): Payer: No Typology Code available for payment source | Admitting: Physical Therapy

## 2022-06-10 ENCOUNTER — Encounter: Payer: Self-pay | Admitting: Nurse Practitioner

## 2022-06-16 ENCOUNTER — Other Ambulatory Visit (HOSPITAL_COMMUNITY): Payer: Self-pay

## 2022-06-17 ENCOUNTER — Ambulatory Visit: Payer: No Typology Code available for payment source | Admitting: Internal Medicine

## 2022-06-21 ENCOUNTER — Other Ambulatory Visit (HOSPITAL_COMMUNITY): Payer: Self-pay

## 2022-06-21 ENCOUNTER — Ambulatory Visit (INDEPENDENT_AMBULATORY_CARE_PROVIDER_SITE_OTHER): Payer: No Typology Code available for payment source

## 2022-06-21 ENCOUNTER — Encounter: Payer: Self-pay | Admitting: Orthopedic Surgery

## 2022-06-21 ENCOUNTER — Ambulatory Visit (INDEPENDENT_AMBULATORY_CARE_PROVIDER_SITE_OTHER): Payer: No Typology Code available for payment source | Admitting: Orthopedic Surgery

## 2022-06-21 DIAGNOSIS — M25571 Pain in right ankle and joints of right foot: Secondary | ICD-10-CM

## 2022-06-21 MED ORDER — LIDOCAINE HCL 1 % IJ SOLN
2.0000 mL | INTRAMUSCULAR | Status: AC | PRN
  Administered 2022-06-21: 2 mL

## 2022-06-21 MED ORDER — METHYLPREDNISOLONE ACETATE 40 MG/ML IJ SUSP
40.0000 mg | INTRAMUSCULAR | Status: AC | PRN
  Administered 2022-06-21: 40 mg via INTRA_ARTICULAR

## 2022-06-21 MED ORDER — PREDNISONE 10 MG PO TABS
10.0000 mg | ORAL_TABLET | Freq: Every day | ORAL | 1 refills | Status: DC
Start: 2022-06-21 — End: 2022-08-24
  Filled 2022-06-21: qty 30, 30d supply, fill #0

## 2022-06-21 NOTE — Progress Notes (Signed)
Office Visit Note   Patient: Kayla Chaney           Date of Birth: 01-13-1976           MRN: [id number] Visit Date: 06/21/2022              Requested by: Renee Rival, [address] [place],  [place] [id number] PCP: Renee Rival, FNP  Chief Complaint  Patient presents with   Right Ankle - Pain      HPI: Patient is a 46 year old woman who is 7 months status post right subtalar and talonavicular fusion for posterior tibial tendon insufficiency.  Patient states that about 2 weeks ago she started having some snapping pain in the peroneal tendons and some pain laterally in the ankle with weightbearing.  Assessment & Plan: Visit Diagnoses:  1. Pain in right ankle and joints of right foot     Plan: The tibiotalar joint was injected she tolerated this well she did have immediate relief of some of her symptoms.  Patient will see how she does with the shot over the next week and then may start prednisone 10 mg with breakfast and week.  Will reevaluate in 4 weeks.  Discussed that we most likely will need to remove the posterior screw of the subtalar fusion and possible ankle arthroscopy.  Follow-Up Instructions: No follow-ups on file.   Ortho Exam  Patient is alert, oriented, no adenopathy, well-dressed, normal affect, normal respiratory effort. Examination patient has mechanical snapping with the peroneal tendons they are functioning.  She has good inversion and eversion strength.  Patient seems to have scar tissue that is causing adhesions to the peroneal tendons.  Patient also has tenderness to palpation of the lateral joint line.  After tibial talar injection patient had good interval relief.  Imaging: XR Ankle Complete Right  Result Date: 06/21/2022 Three-view radiographs of the right ankle shows lateral migration of the posterior screw for the subtalar fusion.  The screws threads are within the cartilage of the gutter of the talus.  XR Foot  Complete Right  Result Date: 06/21/2022 Three-view radiographs of the right foot shows a stable subtalar and talonavicular fusion.  No images are attached to the encounter.  Labs: Lab Results  Component Value Date   LABORGA ESCHERICHIA COLI 10/28/2014     Lab Results  Component Value Date   ALBUMIN 4.4 07/03/2020   ALBUMIN 4.5 08/13/2019   ALBUMIN 4.5 01/25/2019    No results found for: "MG" Lab Results  Component Value Date   VD25OH 38.5 01/25/2019    No results found for: "PREALBUMIN"    Latest Ref Rng & Units 10/27/2021    7:38 AM 08/13/2019    9:36 AM  CBC EXTENDED  WBC 4.0 - 10.5 K/uL 5.7  4.7   RBC 3.87 - 5.11 MIL/uL 4.01  4.30   Hemoglobin 12.0 - 15.0 g/dL 12.6  14.2   HCT 36.0 - 46.0 % 36.7  40.5   Platelets 150 - 400 K/uL 245  203      There is no height or weight on file to calculate BMI.  Orders:  Orders Placed This Encounter  Procedures   XR Foot Complete Right   XR Ankle Complete Right   No orders of the defined types were placed in this encounter.    Procedures: Medium Joint Inj: R ankle on 06/21/2022 4:23 PM Indications: pain and diagnostic evaluation Details: 22 G 1.5 in needle, anteromedial approach  Medications: 2 mL lidocaine 1 %; 40 mg methylPREDNISolone acetate 40 MG/ML Outcome: tolerated well, no immediate complications Procedure, treatment alternatives, risks and benefits explained, specific risks discussed. Consent was given by the patient. Immediately prior to procedure a time out was called to verify the correct patient, procedure, equipment, support staff and site/side marked as required. Patient was prepped and draped in the usual sterile fashion.      Clinical Data: No additional findings.  ROS:  All other systems negative, except as noted in the HPI. Review of Systems  Objective: Vital Signs: There were no vitals taken for this visit.  Specialty Comments:  No specialty comments available.  PMFS History: Patient  Active Problem List   Diagnosis Date Noted   Posterior tibialis tendon insufficiency    Otitis externa, left 07/25/2021   Acute otalgia, left 07/23/2021   Palpitations 07/09/2021   Hot flashes due to menopause 07/09/2021   IUD (intrauterine device) in place 07/09/2021   Hyperlipemia 07/09/2021   Insomnia 11/13/2013   Degenerative joint disease of cervical spine 11/13/2013   Past Medical History:  Diagnosis Date   COVID 08/2020   mild case   Hot flashes due to menopause 07/09/2021   Hyperlipidemia    IUD (intrauterine device) in place 07/09/2021    Family History  Problem Relation Age of Onset   Stroke Mother    Hypertension Father    Diabetes Father    Brain cancer Sister    Stroke Maternal Grandmother     Past Surgical History:  Procedure Laterality Date   CESAREAN SECTION  2003, 2008   COLONOSCOPY WITH PROPOFOL N/A 11/20/2020   Procedure: COLONOSCOPY WITH PROPOFOL;  Surgeon: Eloise Harman, DO;  [facility];  Service: Endoscopy;  Laterality: N/A;  am/ASA II, pt tested +1/24 thru [employer] at work <90 - results emailed to [facility] - advised pt current arrival time of 7:00 - needed to know for transportation cy 3/21   [place] N/A 2008   Retained placenta from c-section birth   FOOT ARTHRODESIS Right 10/27/2021   Procedure: RIGHT SUBTALAR AND TALONAVICULAR FUSION;  Surgeon: Newt Minion, MD;  Location: [facility];  Service: Orthopedics;  Laterality: Right;   POLYPECTOMY  11/20/2020   Procedure: POLYPECTOMY;  Surgeon: Eloise Harman, DO;  [facility];  Service: Endoscopy;;   Social History   Occupational History    Comment: XRAY tech  Tobacco Use   Smoking status: Never   Smokeless tobacco: Never  Vaping Use   Vaping Use: Never used  Substance and Sexual Activity   Alcohol use: Yes    Comment: occ   Drug use: No   Sexual activity: Yes    Birth control/protection: I.U.D.

## 2022-06-22 ENCOUNTER — Other Ambulatory Visit (HOSPITAL_COMMUNITY): Payer: Self-pay

## 2022-06-22 ENCOUNTER — Ambulatory Visit: Payer: No Typology Code available for payment source | Admitting: Family

## 2022-06-27 ENCOUNTER — Other Ambulatory Visit (HOSPITAL_COMMUNITY): Payer: Self-pay

## 2022-06-28 ENCOUNTER — Encounter: Payer: Self-pay | Admitting: Orthopedic Surgery

## 2022-07-08 ENCOUNTER — Other Ambulatory Visit (HOSPITAL_COMMUNITY): Payer: Self-pay

## 2022-07-08 ENCOUNTER — Encounter: Payer: Self-pay | Admitting: Orthopedic Surgery

## 2022-07-08 MED ORDER — ACETAMINOPHEN-CODEINE 300-30 MG PO TABS
1.0000 | ORAL_TABLET | Freq: Three times a day (TID) | ORAL | 0 refills | Status: DC | PRN
  Filled 2022-07-08: qty 30, 10d supply, fill #0

## 2022-07-11 ENCOUNTER — Ambulatory Visit: Payer: Self-pay | Admitting: Nurse Practitioner

## 2022-07-13 ENCOUNTER — Encounter: Payer: Self-pay | Admitting: Internal Medicine

## 2022-07-13 ENCOUNTER — Other Ambulatory Visit (HOSPITAL_COMMUNITY): Payer: Self-pay

## 2022-07-13 ENCOUNTER — Ambulatory Visit (INDEPENDENT_AMBULATORY_CARE_PROVIDER_SITE_OTHER): Payer: No Typology Code available for payment source | Admitting: Internal Medicine

## 2022-07-13 VITALS — BP 125/76 | HR 78 | Ht 65.5 in | Wt 178.6 lb

## 2022-07-13 DIAGNOSIS — G479 Sleep disorder, unspecified: Secondary | ICD-10-CM

## 2022-07-13 DIAGNOSIS — Z0001 Encounter for general adult medical examination with abnormal findings: Secondary | ICD-10-CM

## 2022-07-13 DIAGNOSIS — E785 Hyperlipidemia, unspecified: Secondary | ICD-10-CM

## 2022-07-13 DIAGNOSIS — G4700 Insomnia, unspecified: Secondary | ICD-10-CM

## 2022-07-13 MED ORDER — HYDROXYZINE HCL 10 MG PO TABS
20.0000 mg | ORAL_TABLET | Freq: Every evening | ORAL | 1 refills | Status: DC | PRN
  Filled 2022-07-13 – 2022-07-16 (×2): qty 90, 45d supply, fill #0
  Filled 2022-09-20: qty 90, 45d supply, fill #1

## 2022-07-13 NOTE — Progress Notes (Signed)
Complete physical exam  Patient: Kayla Chaney   DOB: [birth year]   46 y.o. Female  MRN: [id number]  Subjective:    Chief Complaint  Patient presents with   Annual Exam    Kayla Chaney is a 46 y.o. female who presents today for a complete physical exam. She reports consuming a low sodium diet. Home exercise routine includes walking 1 hrs per day. She generally feels fairly well. She reports sleeping well. She does not have additional problems to discuss today.   Most recent fall risk assessment:    07/13/2022    4:18 PM  Fall Risk   Falls in the past year? 1  Number falls in past yr: 0  Injury with Fall? 0  Risk for fall due to : No Fall Risks  Follow up Falls evaluation completed     Most recent depression screenings:    07/13/2022    4:18 PM 08/27/2021   11:45 AM  PHQ 2/9 Scores  PHQ - 2 Score 0 0  PHQ- 9 Score 2 1   Vision:Within last year and Dental: No current dental problems and Receives regular dental care  Past Medical History:  Diagnosis Date   COVID 08/2020   mild case   Hot flashes due to menopause 07/09/2021   Hyperlipidemia    IUD (intrauterine device) in place 07/09/2021   Past Surgical History:  Procedure Laterality Date   CESAREAN SECTION  2003, 2008   COLONOSCOPY WITH PROPOFOL N/A 11/20/2020   Procedure: COLONOSCOPY WITH PROPOFOL;  Surgeon: Eloise Harman, DO;  [facility];  Service: Endoscopy;  Laterality: N/A;  am/ASA II, pt tested +1/24 thru [employer] at work <90 - results emailed to [facility] - advised pt current arrival time of 7:00 - needed to know for transportation cy 3/21   [place] N/A 2008   Retained placenta from c-section birth   FOOT ARTHRODESIS Right 10/27/2021   Procedure: RIGHT SUBTALAR AND TALONAVICULAR FUSION;  Surgeon: Newt Minion, MD;  Location: [facility];  Service: Orthopedics;  Laterality: Right;   POLYPECTOMY  11/20/2020   Procedure: POLYPECTOMY;  Surgeon: Eloise Harman, DO;  [facility];  Service: Endoscopy;;   Social History   Tobacco Use   Smoking status: Never   Smokeless tobacco: Never  Vaping Use   Vaping Use: Never used  Substance Use Topics   Alcohol use: Yes    Comment: occ   Drug use: No   Family History  Problem Relation Age of Onset   Stroke Mother    Hypertension Father    Diabetes Father    Brain cancer Sister    Stroke Maternal Grandmother    No Known Allergies   Patient Care Team: Johnette Abraham, MD as PCP - General (Internal Medicine) Eloise Harman, DO as Consulting Physician (Internal Medicine)   Outpatient Medications Prior to Visit  Medication Sig   acetaminophen-codeine (TYLENOL #3) 300-30 MG tablet Take 1 tablet by mouth every 8 (eight) hours as needed for moderate pain.   Calcium Carbonate-Vit D-Min (SM CALCIUM/VITAMIN D3) 600-800 MG-UNIT TABS Take 1 tablet by mouth daily.   cholecalciferol (VITAMIN D3) 25 MCG (1000 UNIT) tablet Take 1,000 Units by mouth daily.   estradiol (ESTRACE) 0.5 MG tablet TAKE 1 TABLET BY MOUTH ONCE A DAY   levonorgestrel (MIRENA) 20 MCG/24HR IUD 1 each by Intrauterine route once.   methocarbamol (ROBAXIN) 500 MG tablet Take 1 tablet by mouth every  8 (eight) hours as needed for muscle spasms.   nabumetone (RELAFEN) 750 MG tablet Take 1 tablet (750 mg total) by mouth 2 (two) times daily as needed for mild pain or moderate pain. with food   predniSONE (DELTASONE) 10 MG tablet Take 1 tablet (10 mg total) by mouth daily with breakfast.   [DISCONTINUED] hydrOXYzine (ATARAX) 10 MG tablet TAKE 1 TO 2 TABLETS BY MOUTH AT BEDTIME AS NEEDED (Patient taking differently: Take 20 mg by mouth at bedtime as needed (sleep).)   [DISCONTINUED] gabapentin (NEURONTIN) 100 MG capsule Take 1 capsule (100 mg total) by mouth 3 (three) times daily.   [DISCONTINUED] gabapentin (NEURONTIN) 300 MG capsule Take 1 capsule (300 mg total) by mouth 3 (three) times daily when necessary for neuropathy pain    [DISCONTINUED] gabapentin (NEURONTIN) 300 MG capsule Take 1 capsule (300 mg) by mouth 3 to 4 times a day as needed for neuropathy pain.   [DISCONTINUED] meloxicam (MOBIC) 7.5 MG tablet Take 1 tablet  by mouth daily.   No facility-administered medications prior to visit.    Review of Systems  Constitutional:  Negative for chills and fever.  HENT:  Negative for sore throat.   Respiratory:  Negative for cough and shortness of breath.   Cardiovascular:  Negative for chest pain, palpitations and leg swelling.  Gastrointestinal:  Negative for abdominal pain, blood in stool, constipation, diarrhea, nausea and vomiting.  Genitourinary:  Negative for dysuria and hematuria.  Musculoskeletal:  Positive for joint pain (right ankle). Negative for myalgias.  Skin:  Negative for itching and rash.  Neurological:  Negative for dizziness and headaches.  Psychiatric/Behavioral:  Negative for depression and suicidal ideas.       Objective:     BP 125/76   Pulse 78   Ht 5' 5.5" (1.664 m)   Wt 178 lb 9.6 oz (81 kg)   SpO2 98%   BMI 29.27 kg/m  BP Readings from Last 3 Encounters:  07/13/22 125/76  10/27/21 115/62  08/27/21 112/74   Physical Exam Vitals reviewed.  Constitutional:      General: She is not in acute distress.    Appearance: Normal appearance. She is not toxic-appearing.  HENT:     Head: Normocephalic and atraumatic.     Right Ear: External ear normal.     Left Ear: External ear normal.     Nose: Nose normal. No congestion or rhinorrhea.     Mouth/Throat:     Mouth: Mucous membranes are moist.     Pharynx: Oropharynx is clear. No oropharyngeal exudate or posterior oropharyngeal erythema.  Eyes:     General: No scleral icterus.    Extraocular Movements: Extraocular movements intact.     Conjunctiva/sclera: Conjunctivae normal.     Pupils: Pupils are equal, round, and reactive to light.  Cardiovascular:     Rate and Rhythm: Normal rate and regular rhythm.     Pulses: Normal  pulses.     Heart sounds: Normal heart sounds. No murmur heard.    No friction rub. No gallop.  Pulmonary:     Effort: Pulmonary effort is normal.     Breath sounds: Normal breath sounds. No wheezing, rhonchi or rales.  Abdominal:     General: Abdomen is flat. Bowel sounds are normal. There is no distension.     Palpations: Abdomen is soft.     Tenderness: There is no abdominal tenderness.  Musculoskeletal:        General: Swelling (right lateral ankle) present. Normal range  of motion.     Cervical back: Normal range of motion.     Right lower leg: No edema.     Left lower leg: No edema.  Lymphadenopathy:     Cervical: No cervical adenopathy.  Skin:    General: Skin is warm and dry.     Capillary Refill: Capillary refill takes less than 2 seconds.     Coloration: Skin is not jaundiced.  Neurological:     General: No focal deficit present.     Mental Status: She is alert and oriented to person, place, and time.  Psychiatric:        Mood and Affect: Mood normal.        Behavior: Behavior normal.     Last CBC Lab Results  Component Value Date   WBC 5.7 10/27/2021   HGB 12.6 10/27/2021   HCT 36.7 10/27/2021   MCV 91.5 10/27/2021   MCH 31.4 10/27/2021   RDW 11.7 10/27/2021   PLT 245 60/11/5995   Last metabolic panel Lab Results  Component Value Date   GLUCOSE 82 07/03/2020   NA 140 07/03/2020   K 4.0 07/03/2020   CL 104 07/03/2020   CO2 22 07/03/2020   BUN 8 07/03/2020   CREATININE 0.72 07/03/2020   GFRNONAA 101 07/03/2020   CALCIUM 9.5 07/03/2020   PROT 6.9 07/03/2020   ALBUMIN 4.4 07/03/2020   LABGLOB 2.5 07/03/2020   AGRATIO 1.8 07/03/2020   BILITOT 0.4 07/03/2020   ALKPHOS 50 07/03/2020   AST 18 07/03/2020   ALT 21 07/03/2020   Last lipids Lab Results  Component Value Date   CHOL 243 (H) 07/03/2020   HDL 43 07/03/2020   LDLCALC 179 (H) 07/03/2020   TRIG 114 07/03/2020   CHOLHDL 5.7 (H) 07/03/2020   Last thyroid functions Lab Results  Component  Value Date   TSH 1.110 08/13/2019   Last vitamin D Lab Results  Component Value Date   VD25OH 38.5 01/25/2019       Assessment & Plan:    Routine Health Maintenance and Physical Exam  Immunization History  Administered Date(s) Administered   Influenza,inj,Quad PF,6+ Mos 05/27/2019, 06/04/2021, 06/16/2022   Influenza-Unspecified 05/23/2014, 05/29/2016, 06/04/2020   Moderna SARS-COV2 Booster Vaccination 07/17/2020   Moderna Sars-Covid-2 Vaccination 09/03/2019, 10/04/2019   Tdap 11/10/2016    Health Maintenance  Topic Date Due   COVID-19 Vaccine (3 - Moderna series) 09/11/2020   PAP SMEAR-Modifier  01/24/2022   TETANUS/TDAP  11/11/2026   COLONOSCOPY (Pts 45-89yr Insurance coverage will need to be confirmed)  11/21/2030   INFLUENZA VACCINE  Completed   Hepatitis C Screening  Completed   HIV Screening  Completed   HPV VACCINES  Aged Out    Discussed health benefits of physical activity, and encouraged her to engage in regular exercise appropriate for her age and condition.  Problem List Items Addressed This Visit       Insomnia    Atarax refilled today      Hyperlipemia    Previously prescribed rosuvastatin 5 mg daily, however she was unable to tolerate the medication due to myalgias.  She is not currently on any cholesterol-lowering therapy.  She reports that she will undergo coronary calcium scoring when she turns 49 and then further discuss treatment options with her cardiologist.      Encounter for annual general medical examination with abnormal findings in adult - Primary    Presenting today for her annual exam.  Previous records and labs were reviewed. -Repeat  labs ordered today.  These will be completed in 2 weeks. -Vaccines are up-to-date -She is scheduled to undergo a Pap smear with her OB/GYN next month -Ms. Paccione is currently scheduled for surgery to remove hardware in her right ankle next week (11/22).  She is currently on prednisone and is hoping to  taper off after surgery. -We will tentatively plan for follow-up in 6 months       Return in about 6 months (around 01/11/2023).     Johnette Abraham, MD

## 2022-07-13 NOTE — Assessment & Plan Note (Signed)
Presenting today for her annual exam.  Previous records and labs were reviewed. -Repeat labs ordered today.  These will be completed in 2 weeks. -Vaccines are up-to-date -She is scheduled to undergo a Pap smear with her OB/GYN next month -Kayla Chaney is currently scheduled for surgery to remove hardware in her right ankle next week (11/22).  She is currently on prednisone and is hoping to taper off after surgery. -We will tentatively plan for follow-up in 6 months

## 2022-07-13 NOTE — Assessment & Plan Note (Signed)
Previously prescribed rosuvastatin 5 mg daily, however she was unable to tolerate the medication due to myalgias.  She is not currently on any cholesterol-lowering therapy.  She reports that she will undergo coronary calcium scoring when she turns 49 and then further discuss treatment options with her cardiologist.

## 2022-07-13 NOTE — Patient Instructions (Signed)
It was a pleasure to see you today.  Thank you for giving [place] the opportunity to be involved in your care.  Below is a brief recap of your visit and next steps.  We will plan to see you again in 6 months.  Summary We have completed your annual exam today I have ordered labs to be completed in 2 weeks Atarax has been refilled Good luck with your surgery next week

## 2022-07-13 NOTE — Assessment & Plan Note (Signed)
Atarax refilled today

## 2022-07-16 ENCOUNTER — Other Ambulatory Visit (HOSPITAL_COMMUNITY): Payer: Self-pay

## 2022-07-19 ENCOUNTER — Ambulatory Visit: Payer: No Typology Code available for payment source | Admitting: Orthopedic Surgery

## 2022-07-19 ENCOUNTER — Encounter (HOSPITAL_COMMUNITY): Payer: Self-pay | Admitting: Orthopedic Surgery

## 2022-07-19 ENCOUNTER — Other Ambulatory Visit: Payer: Self-pay

## 2022-07-19 NOTE — Progress Notes (Signed)
[name] denies chest pain or shortness of breath. Patient denies having any s/s of Covid in her household, also denies any known exposure to Covid.  [name]'s PCP is Dr. Marland Kitchen.

## 2022-07-19 NOTE — Anesthesia Preprocedure Evaluation (Addendum)
Anesthesia Evaluation  Patient identified by MRN, date of birth, ID band Patient awake    Reviewed: Allergy & Precautions, NPO status , Patient's Chart, lab work & pertinent test results  History of Anesthesia Complications (+) PONV and history of anesthetic complications  Airway Mallampati: III  TM Distance: >3 FB Neck ROM: Full    Dental  (+) Dental Advisory Given   Pulmonary neg pulmonary ROS   Pulmonary exam normal breath sounds clear to auscultation       Cardiovascular negative cardio ROS Normal cardiovascular exam Rhythm:Regular Rate:Normal     Neuro/Psych negative neurological ROS  negative psych ROS   GI/Hepatic negative GI ROS, Neg liver ROS,,,  Endo/Other  negative endocrine ROS    Renal/GU negative Renal ROS  negative genitourinary   Musculoskeletal  (+) Arthritis , Osteoarthritis,  Impingement right ankle  Painful hardware right ankle   Abdominal   Peds  Hematology negative hematology ROS (+)   Anesthesia Other Findings   Reproductive/Obstetrics                              Anesthesia Physical Anesthesia Plan  ASA: 2  Anesthesia Plan: General   Post-op Pain Management: Tylenol PO (pre-op)*, Minimal or no pain anticipated and Precedex   Induction: Intravenous  PONV Risk Score and Plan: 4 or greater and Treatment may vary due to age or medical condition, Scopolamine patch - Pre-op, Midazolam, Ondansetron and Dexamethasone  Airway Management Planned: LMA  Additional Equipment: None  Intra-op Plan:   Post-operative Plan: Extubation in OR  Informed Consent: I have reviewed the patients History and Physical, chart, labs and discussed the procedure including the risks, benefits and alternatives for the proposed anesthesia with the patient or authorized representative who has indicated his/her understanding and acceptance.     Dental advisory given  Plan  Discussed with: CRNA and Anesthesiologist  Anesthesia Plan Comments:          Anesthesia Quick Evaluation

## 2022-07-20 ENCOUNTER — Other Ambulatory Visit: Payer: Self-pay

## 2022-07-20 ENCOUNTER — Ambulatory Visit (HOSPITAL_COMMUNITY): Payer: No Typology Code available for payment source | Admitting: Anesthesiology

## 2022-07-20 ENCOUNTER — Ambulatory Visit (HOSPITAL_COMMUNITY): Payer: No Typology Code available for payment source

## 2022-07-20 ENCOUNTER — Ambulatory Visit (HOSPITAL_COMMUNITY)
Admission: RE | Admit: 2022-07-20 | Discharge: 2022-07-20 | Disposition: A | Discharge status: Home / Self Care | Payer: No Typology Code available for payment source | Source: Ambulatory Visit | Attending: Orthopedic Surgery | Admitting: Orthopedic Surgery

## 2022-07-20 ENCOUNTER — Ambulatory Visit (HOSPITAL_BASED_OUTPATIENT_CLINIC_OR_DEPARTMENT_OTHER): Payer: No Typology Code available for payment source | Admitting: Anesthesiology

## 2022-07-20 ENCOUNTER — Encounter (HOSPITAL_COMMUNITY)
Admission: RE | Disposition: A | Discharge status: Home / Self Care | Payer: Self-pay | Source: Ambulatory Visit | Attending: Orthopedic Surgery

## 2022-07-20 ENCOUNTER — Other Ambulatory Visit (HOSPITAL_COMMUNITY): Payer: Self-pay

## 2022-07-20 ENCOUNTER — Encounter (HOSPITAL_COMMUNITY): Payer: Self-pay | Admitting: Orthopedic Surgery

## 2022-07-20 DIAGNOSIS — M25871 Other specified joint disorders, right ankle and foot: Secondary | ICD-10-CM

## 2022-07-20 DIAGNOSIS — T8484XA Pain due to internal orthopedic prosthetic devices, implants and grafts, initial encounter: Secondary | ICD-10-CM | POA: Insufficient documentation

## 2022-07-20 DIAGNOSIS — M1990 Unspecified osteoarthritis, unspecified site: Secondary | ICD-10-CM | POA: Insufficient documentation

## 2022-07-20 DIAGNOSIS — T85848A Pain due to other internal prosthetic devices, implants and grafts, initial encounter: Secondary | ICD-10-CM

## 2022-07-20 DIAGNOSIS — Y831 Surgical operation with implant of artificial internal device as the cause of abnormal reaction of the patient, or of later complication, without mention of misadventure at the time of the procedure: Secondary | ICD-10-CM | POA: Insufficient documentation

## 2022-07-20 DIAGNOSIS — T85848D Pain due to other internal prosthetic devices, implants and grafts, subsequent encounter: Secondary | ICD-10-CM

## 2022-07-20 HISTORY — DX: Other specified postprocedural states: Z98.890

## 2022-07-20 HISTORY — PX: ANKLE ARTHROSCOPY: SHX545

## 2022-07-20 HISTORY — DX: Other complications of anesthesia, initial encounter: T88.59XA

## 2022-07-20 HISTORY — DX: Other specified postprocedural states: R11.2

## 2022-07-20 HISTORY — PX: HARDWARE REMOVAL: SHX979

## 2022-07-20 LAB — CBC
HCT: 40.7 % (ref 36.0–46.0)
Hemoglobin: 14.4 g/dL (ref 12.0–15.0)
MCH: 32.1 pg (ref 26.0–34.0)
MCHC: 35.4 g/dL (ref 30.0–36.0)
MCV: 90.8 fL (ref 80.0–100.0)
Platelets: 278 10*3/uL (ref 150–400)
RBC: 4.48 MIL/uL (ref 3.87–5.11)
RDW: 12.1 % (ref 11.5–15.5)
WBC: 7.3 10*3/uL (ref 4.0–10.5)
nRBC: 0 % (ref 0.0–0.2)

## 2022-07-20 LAB — SURGICAL PCR SCREEN
MRSA, PCR: NEGATIVE
Staphylococcus aureus: NEGATIVE

## 2022-07-20 LAB — POCT PREGNANCY, URINE: Preg Test, Ur: NEGATIVE

## 2022-07-20 SURGERY — REMOVAL, HARDWARE
Anesthesia: General | Site: Ankle | Laterality: Right

## 2022-07-20 MED ORDER — MIDAZOLAM HCL 2 MG/2ML IJ SOLN
INTRAMUSCULAR | Status: AC
  Administered 2022-07-20: 1 mg via INTRAVENOUS
  Filled 2022-07-20: qty 2

## 2022-07-20 MED ORDER — SODIUM CHLORIDE 0.9 % IR SOLN
Status: DC | PRN
  Administered 2022-07-20: 3000 mL

## 2022-07-20 MED ORDER — CHLORHEXIDINE GLUCONATE 0.12 % MT SOLN
15.0000 mL | Freq: Once | OROMUCOSAL | Status: AC
  Administered 2022-07-20: 15 mL via OROMUCOSAL
  Filled 2022-07-20: qty 15

## 2022-07-20 MED ORDER — FENTANYL CITRATE (PF) 250 MCG/5ML IJ SOLN
INTRAMUSCULAR | Status: DC | PRN
  Administered 2022-07-20: 50 ug via INTRAVENOUS

## 2022-07-20 MED ORDER — PROPOFOL 10 MG/ML IV BOLUS
INTRAVENOUS | Status: AC
  Filled 2022-07-20: qty 20

## 2022-07-20 MED ORDER — FENTANYL CITRATE (PF) 100 MCG/2ML IJ SOLN: 50.0000 ug | Freq: Once | INTRAMUSCULAR | Status: AC

## 2022-07-20 MED ORDER — CEFAZOLIN SODIUM-DEXTROSE 2-3 GM-%(50ML) IV SOLR
INTRAVENOUS | Status: DC | PRN
  Administered 2022-07-20: 2 g via INTRAVENOUS

## 2022-07-20 MED ORDER — FENTANYL CITRATE (PF) 250 MCG/5ML IJ SOLN
INTRAMUSCULAR | Status: AC
  Filled 2022-07-20: qty 5

## 2022-07-20 MED ORDER — LIDOCAINE 2% (20 MG/ML) 5 ML SYRINGE
INTRAMUSCULAR | Status: DC | PRN
  Administered 2022-07-20: 20 mg via INTRAVENOUS

## 2022-07-20 MED ORDER — BUPIVACAINE-EPINEPHRINE (PF) 0.5% -1:200000 IJ SOLN
INTRAMUSCULAR | Status: DC | PRN
  Administered 2022-07-20: 30 mL via PERINEURAL

## 2022-07-20 MED ORDER — DEXAMETHASONE SODIUM PHOSPHATE 10 MG/ML IJ SOLN
INTRAMUSCULAR | Status: AC
  Filled 2022-07-20: qty 1

## 2022-07-20 MED ORDER — SCOPOLAMINE 1 MG/3DAYS TD PT72
1.0000 | MEDICATED_PATCH | TRANSDERMAL | Status: DC
  Administered 2022-07-20: 1.5 mg via TRANSDERMAL
  Filled 2022-07-20: qty 1

## 2022-07-20 MED ORDER — ONDANSETRON HCL 4 MG/2ML IJ SOLN
INTRAMUSCULAR | Status: DC | PRN
  Administered 2022-07-20: 4 mg via INTRAVENOUS

## 2022-07-20 MED ORDER — ONDANSETRON HCL 4 MG/2ML IJ SOLN
INTRAMUSCULAR | Status: AC
  Filled 2022-07-20: qty 2

## 2022-07-20 MED ORDER — ORAL CARE MOUTH RINSE: 15.0000 mL | Freq: Once | OROMUCOSAL | Status: AC

## 2022-07-20 MED ORDER — HYDROCODONE-ACETAMINOPHEN 5-325 MG PO TABS
1.0000 | ORAL_TABLET | ORAL | 0 refills | Status: DC | PRN
  Filled 2022-07-20: qty 30, 5d supply, fill #0

## 2022-07-20 MED ORDER — LACTATED RINGERS IV SOLN: INTRAVENOUS | Status: DC

## 2022-07-20 MED ORDER — EPHEDRINE SULFATE-NACL 50-0.9 MG/10ML-% IV SOSY
PREFILLED_SYRINGE | INTRAVENOUS | Status: DC | PRN
  Administered 2022-07-20: 5 mg via INTRAVENOUS

## 2022-07-20 MED ORDER — ARTIFICIAL TEARS OPHTHALMIC OINT
TOPICAL_OINTMENT | OPHTHALMIC | Status: AC
  Filled 2022-07-20: qty 3.5

## 2022-07-20 MED ORDER — LIDOCAINE 2% (20 MG/ML) 5 ML SYRINGE
INTRAMUSCULAR | Status: AC
  Filled 2022-07-20: qty 5

## 2022-07-20 MED ORDER — PROPOFOL 10 MG/ML IV BOLUS
INTRAVENOUS | Status: DC | PRN
  Administered 2022-07-20: 200 mg via INTRAVENOUS

## 2022-07-20 MED ORDER — MIDAZOLAM HCL 2 MG/2ML IJ SOLN: 1.0000 mg | Freq: Once | INTRAMUSCULAR | Status: AC

## 2022-07-20 MED ORDER — MIDAZOLAM HCL 2 MG/2ML IJ SOLN
INTRAMUSCULAR | Status: AC
  Filled 2022-07-20: qty 2

## 2022-07-20 MED ORDER — FENTANYL CITRATE (PF) 100 MCG/2ML IJ SOLN
INTRAMUSCULAR | Status: AC
  Administered 2022-07-20: 50 ug via INTRAVENOUS
  Filled 2022-07-20: qty 2

## 2022-07-20 SURGICAL SUPPLY — 65 items
BAG COUNTER SPONGE SURGICOUNT (BAG) ×3 IMPLANT
BAG SPNG CNTER NS LX DISP (BAG) ×2
BANDAGE ESMARK 6X9 LF (GAUZE/BANDAGES/DRESSINGS) IMPLANT
BLADE CUDA 5.5 (BLADE) IMPLANT
BLADE EXCALIBUR 4.0X13 (MISCELLANEOUS) IMPLANT
BNDG CMPR 5X6 CHSV STRCH STRL (GAUZE/BANDAGES/DRESSINGS) ×2
BNDG CMPR 9X6 STRL LF SNTH (GAUZE/BANDAGES/DRESSINGS)
BNDG COHESIVE 4X5 TAN STRL (GAUZE/BANDAGES/DRESSINGS) IMPLANT
BNDG COHESIVE 6X5 TAN ST LF (GAUZE/BANDAGES/DRESSINGS) IMPLANT
BNDG COHESIVE 6X5 TAN STRL LF (GAUZE/BANDAGES/DRESSINGS) ×3 IMPLANT
BNDG ESMARK 6X9 LF (GAUZE/BANDAGES/DRESSINGS)
BNDG GAUZE DERMACEA FLUFF 4 (GAUZE/BANDAGES/DRESSINGS) ×3 IMPLANT
BNDG GZE DERMACEA 4 6PLY (GAUZE/BANDAGES/DRESSINGS) ×2
BUR OVAL 4.0 (BURR) IMPLANT
COVER SURGICAL LIGHT HANDLE (MISCELLANEOUS) ×6 IMPLANT
CUFF TOURN SGL QUICK 34 (TOURNIQUET CUFF)
CUFF TOURN SGL QUICK 42 (TOURNIQUET CUFF) IMPLANT
CUFF TRNQT CYL 34X4.125X (TOURNIQUET CUFF) IMPLANT
DRAPE ARTHROSCOPY W/POUCH 114 (DRAPES) ×3 IMPLANT
DRAPE C-ARM 42X72 X-RAY (DRAPES) IMPLANT
DRAPE INCISE IOBAN 66X45 STRL (DRAPES) IMPLANT
DRAPE OEC MINIVIEW 54X84 (DRAPES) IMPLANT
DRAPE ORTHO SPLIT 77X108 STRL (DRAPES)
DRAPE SURG ORHT 6 SPLT 77X108 (DRAPES) IMPLANT
DRAPE U-SHAPE 47X51 STRL (DRAPES) ×3 IMPLANT
DRSG EMULSION OIL 3X3 NADH (GAUZE/BANDAGES/DRESSINGS) ×3 IMPLANT
DURAPREP 26ML APPLICATOR (WOUND CARE) ×3 IMPLANT
ELECT REM PT RETURN 9FT ADLT (ELECTROSURGICAL) ×2
ELECTRODE REM PT RTRN 9FT ADLT (ELECTROSURGICAL) ×3 IMPLANT
GAUZE PAD ABD 8X10 STRL (GAUZE/BANDAGES/DRESSINGS) ×3 IMPLANT
GAUZE SPONGE 4X4 12PLY STRL (GAUZE/BANDAGES/DRESSINGS) ×3 IMPLANT
GLOVE BIOGEL PI IND STRL 9 (GLOVE) ×3 IMPLANT
GLOVE SURG ORTHO 9.0 STRL STRW (GLOVE) ×3 IMPLANT
GOWN STRL REUS W/ TWL XL LVL3 (GOWN DISPOSABLE) ×9 IMPLANT
GOWN STRL REUS W/TWL XL LVL3 (GOWN DISPOSABLE) ×6
GUIDEWIRE TYB 2X9 (WIRE) IMPLANT
KIT BASIN OR (CUSTOM PROCEDURE TRAY) ×3 IMPLANT
KIT TURNOVER KIT B (KITS) ×3 IMPLANT
MANIFOLD NEPTUNE II (INSTRUMENTS) ×3 IMPLANT
NDL 18GX1X1/2 (RX/OR ONLY) (NEEDLE) ×3 IMPLANT
NEEDLE 18GX1X1/2 (RX/OR ONLY) (NEEDLE) ×2 IMPLANT
NS IRRIG 1000ML POUR BTL (IV SOLUTION) ×3 IMPLANT
PACK ARTHROSCOPY DSU (CUSTOM PROCEDURE TRAY) ×3 IMPLANT
PACK ORTHO EXTREMITY (CUSTOM PROCEDURE TRAY) ×3 IMPLANT
PAD ARMBOARD 7.5X6 YLW CONV (MISCELLANEOUS) ×6 IMPLANT
PADDING CAST COTTON 6X4 STRL (CAST SUPPLIES) ×3 IMPLANT
PORT APPOLLO RF 90DEGREE MULTI (SURGICAL WAND) IMPLANT
SPONGE T-LAP 18X18 ~~LOC~~+RFID (SPONGE) IMPLANT
SPONGE T-LAP 4X18 ~~LOC~~+RFID (SPONGE) ×3 IMPLANT
STAPLER VISISTAT 35W (STAPLE) IMPLANT
STOCKINETTE IMPERVIOUS 9X36 MD (GAUZE/BANDAGES/DRESSINGS) IMPLANT
SUT ETHILON 2 0 PSLX (SUTURE) IMPLANT
SUT ETHILON 4 0 PS 2 18 (SUTURE) ×3 IMPLANT
SUT MNCRL AB 3-0 PS2 18 (SUTURE) IMPLANT
SUT VIC AB 0 CT1 27 (SUTURE)
SUT VIC AB 0 CT1 27XBRD ANBCTR (SUTURE) IMPLANT
SUT VIC AB 2-0 CT1 27 (SUTURE)
SUT VIC AB 2-0 CT1 TAPERPNT 27 (SUTURE) IMPLANT
SYR 20ML LL LF (SYRINGE) ×3 IMPLANT
TAPE STRIPS DRAPE STRL (GAUZE/BANDAGES/DRESSINGS) IMPLANT
TOWEL GREEN STERILE (TOWEL DISPOSABLE) ×3 IMPLANT
TOWEL GREEN STERILE FF (TOWEL DISPOSABLE) ×3 IMPLANT
TUBING ARTHROSCOPY IRRIG 16FT (MISCELLANEOUS) ×3 IMPLANT
UNDERPAD 30X36 HEAVY ABSORB (UNDERPADS AND DIAPERS) ×3 IMPLANT
WATER STERILE IRR 1000ML POUR (IV SOLUTION) ×3 IMPLANT

## 2022-07-20 NOTE — Progress Notes (Signed)
After PIV started in right hand, the patient stated she did not feel well. Placed on monitor and BP checked. BP- 73/51. Dr. Royce Macadamia with anesthesia made aware. Patient placed in Trendelenburg and fluid bolus given per Dr. Royce Macadamia.  After fluid bolus, patient states she feels better and denies any complaints.

## 2022-07-20 NOTE — Anesthesia Procedure Notes (Signed)
Anesthesia Regional Block: Popliteal block   Pre-Anesthetic Checklist: , timeout performed,  Correct Patient, Correct Site, Correct Laterality,  Correct Procedure, Correct Position, site marked,  Risks and benefits discussed,  Surgical consent,  Pre-op evaluation,  At surgeon's request and post-op pain management  Laterality: Right  Prep: chloraprep       Needles:  Injection technique: Single-shot  Needle Type: Echogenic Stimulator Needle     Needle Length: 10cm  Needle Gauge: 21   Needle insertion depth: 5 cm   Additional Needles:   Procedures:,,,, ultrasound used (permanent image in chart),,    Narrative:  Start time: 07/20/2022 8:01 AM End time: 07/20/2022 8:06 AM Injection made incrementally with aspirations every 5 mL. Anesthesiologist: Josephine Igo, MD  Additional Notes: Timeout performed. Patient sedated. Relevant anatomy ID'd using [place]. Incremental 2-5m injection of LA with frequent aspiration. Patient tolerated procedure well.

## 2022-07-20 NOTE — Progress Notes (Signed)
Orthopedic Tech Progress Note Patient Details:  Kayla Chaney 08/20/1976 [id number]  Ortho Devices Type of Ortho Device: CAM walker, Crutches Ortho Device/Splint Location: RLE Ortho Device/Splint Interventions: Application, Adjustment   Post Interventions Patient Tolerated: Well Instructions Provided: Adjustment of device  [name] E Ariellah Faust 07/20/2022, 10:55 AM

## 2022-07-20 NOTE — Discharge Instructions (Signed)
Dry dressing change in  3 days.

## 2022-07-20 NOTE — Anesthesia Postprocedure Evaluation (Signed)
Anesthesia Post Note  Patient: Kayla Chaney  Procedure(s) Performed: REMOVAL SCREW RIGHT ANKLE (Right) RIGHT ANKLE ARTHROSCOPY (Right: Ankle)     Patient location during evaluation: PACU Anesthesia Type: General Level of consciousness: awake and alert and oriented Pain management: pain level controlled Vital Signs Assessment: post-procedure vital signs reviewed and stable Respiratory status: spontaneous breathing, nonlabored ventilation and respiratory function stable Cardiovascular status: blood pressure returned to baseline and stable Postop Assessment: no apparent nausea or vomiting Anesthetic complications: no   No notable events documented.  Last Vitals:  Vitals:   07/20/22 1015 07/20/22 1030  BP: 108/65 116/71  Pulse: 81 91  Resp: 18 18  Temp:  36.7 C  SpO2: 96% 99%    Last Pain:  Vitals:   07/20/22 1030  PainSc: 0-No pain                 [name]

## 2022-07-20 NOTE — Op Note (Signed)
07/20/2022  9:44 AM  PATIENT:  Kayla Chaney    PRE-OPERATIVE DIAGNOSIS:  Impingement Right Ankle with Painful Hardware  POST-OPERATIVE DIAGNOSIS:  Same  PROCEDURE:  REMOVAL SCREW RIGHT ANKLE, RIGHT ANKLE ARTHROSCOPY C-arm fluoroscopy to identify the location of the screw and verify removal  SURGEON:  Newt Minion, MD  PHYSICIAN ASSISTANT:None ANESTHESIA:   General  PREOPERATIVE INDICATIONS:  Kayla Chaney is a  46 y.o. female with a diagnosis of Impingement Right Ankle with Painful Hardware who failed conservative measures and elected for surgical management.    The risks benefits and alternatives were discussed with the patient preoperatively including but not limited to the risks of infection, bleeding, nerve injury, cardiopulmonary complications, the need for revision surgery, among others, and the patient was willing to proceed.  OPERATIVE IMPLANTS: None  '@ENCIMAGES'$ @  OPERATIVE FINDINGS: C-arm fluoroscopy verified screw removal.  Arthroscopic findings showed no articular cartilage defects of the tibial talar dome the gutters were clear.  There is a few cartilage defects over the anterior aspect of the talus which were not weightbearing.  OPERATIVE PROCEDURE: Patient was brought the operating room after undergoing a regional anesthetic she then underwent a general anesthesia.  After adequate levels anesthesia were obtained patient's right lower extremity was prepped using DuraPrep draped into a sterile field a timeout was called.  Attention was first focused on removal of the deep retained screw.  An incision was made posteriorly over the heel in line with the previous incision.  A guidewire was then inserted through the calcaneus and this was just adjacent to the screw head.  This was then overdrilled with a tissue protector and a guidewire was then inserted through the screw head down the shaft of the screw this was verified by the C arm.  The screwdriver was then used to remove  the screw without complications.  See arthroscopy verified removal.  The wound was irrigated with normal saline and incision closed using 2-0 nylon.  Attention was then focused on the ankle.  A 18-gauge needle was used to identify the anterior medial portal the skin was incised blunt dissection was carried down to the capsule and a blunt trocar was used and inserted into the joint.  With the scope inside the joint the skin was illuminated and an 18-gauge needle was inserted through the anterior lateral portal to miss neurovascular structures.  Visualization showed the 18-gauge needle to be in the appropriate place the skin was incised blunt dissection was carried down to the capsule and a blunt trocar was used inserted into the joint.  The scope was inserted into the joint.  There was inflammatory tissue both anteriorly as well as the medial lateral gutters.  The shaver was used to remove this synovitis.  There is no cartilage defects over the tibial talar weightbearing surface and the medial and lateral gutters were both checked and there was no articular defects in the medial lateral gutters.  There were a few cartilage abrasions anteriorly on the talar body that were not weightbearing.  There were no loose bodies no loose cartilage defects no full-thickness cartilage defects.  The electrical wand was then used for hemostasis a survey of all compartments was again performed no loose bodies no full-thickness articular defects.  The instruments removed the portals were closed using 2-0 nylon a sterile dressing was applied patient was extubated taken the PACU in stable condition.   DISCHARGE PLANNING:  Antibiotic duration: Preoperative antibiotics  Weightbearing: Weightbearing as tolerated in the  postoperative boot  Pain medication: Prescription for Vicodin  Dressing care/ Wound VAC: Dry dressing change in 3 days  Ambulatory devices: Crutches  Discharge to: Home.  Follow-up: In the office 1 week post  operative.

## 2022-07-20 NOTE — H&P (Signed)
Kayla Chaney is an 46 y.o. female.   Chief Complaint: Right ankle pain. HPI: Patient is status post subtalar fusion for posterior tibial tendon insufficiency.  Patient has been having increasing impingement symptoms in her ankle.  Past Medical History:  Diagnosis Date   Complication of anesthesia    COVID 08/2020   mild case   Hot flashes due to menopause 07/09/2021   Hyperlipidemia    IUD (intrauterine device) in place 07/09/2021   PONV (postoperative nausea and vomiting)    just nausea    Past Surgical History:  Procedure Laterality Date   CESAREAN SECTION  2003, 2008   COLONOSCOPY WITH PROPOFOL N/A 11/20/2020   Procedure: COLONOSCOPY WITH PROPOFOL;  Surgeon: Eloise Harman, DO;  [facility];  Service: Endoscopy;  Laterality: N/A;  am/ASA II, pt tested +1/24 thru [facility] at work <90 - results emailed to [place] - advised pt current arrival time of 7:00 - needed to know for transportation cy 3/21   [place] N/A 2008   Retained placenta from c-section birth   FOOT ARTHRODESIS Right 10/27/2021   Procedure: RIGHT SUBTALAR AND TALONAVICULAR FUSION;  Surgeon: Newt Minion, MD;  Location: [facility];  Service: Orthopedics;  Laterality: Right;   POLYPECTOMY  11/20/2020   Procedure: POLYPECTOMY;  Surgeon: Eloise Harman, DO;  [facility];  Service: Endoscopy;;    Family History  Problem Relation Age of Onset   Stroke Mother    Hypertension Father    Diabetes Father    Brain cancer Sister    Stroke Maternal Grandmother    Social History:  reports that she has never smoked. She has never used smokeless tobacco. She reports current alcohol use. She reports that she does not use drugs.  Allergies: No Known Allergies  Medications Prior to Admission  Medication Sig Dispense Refill   acetaminophen-codeine (TYLENOL #3) 300-30 MG tablet Take 1 tablet by mouth every 8 (eight) hours as needed for moderate pain. 30 tablet 0    Calcium Carbonate (CALCIUM 500 PO) Take 500 mg by mouth daily.     calcium carbonate (OS-CAL - DOSED IN MG OF ELEMENTAL CALCIUM) 1250 (500 Ca) MG tablet Take 1 tablet by mouth daily.     cholecalciferol (VITAMIN D3) 25 MCG (1000 UNIT) tablet Take 1,000 Units by mouth daily.     estradiol (ESTRACE) 0.5 MG tablet TAKE 1 TABLET BY MOUTH ONCE A DAY 90 tablet 4   hydrOXYzine (ATARAX) 10 MG tablet Take 2 tablets (20 mg total) by mouth at bedtime as needed (sleep). (Patient taking differently: Take 10-20 mg by mouth at bedtime.) 90 tablet 1   levonorgestrel (MIRENA) 20 MCG/24HR IUD 1 each by Intrauterine route once.     methocarbamol (ROBAXIN) 500 MG tablet Take 1 tablet by mouth every 8 (eight) hours as needed for muscle spasms. 90 tablet 3   predniSONE (DELTASONE) 10 MG tablet Take 1 tablet (10 mg total) by mouth daily with breakfast. 30 tablet 1   nabumetone (RELAFEN) 750 MG tablet Take 1 tablet (750 mg total) by mouth 2 (two) times daily as needed for mild pain or moderate pain. with food (Patient taking differently: Take 750 mg by mouth 2 (two) times daily. with food) 60 tablet 3    No results found for this or any previous visit (from the past 48 hour(s)). No results found.  Review of Systems  All other systems reviewed and are negative.   Blood pressure 130/81,  pulse 97, temperature 98.7 F (37.1 C), resp. rate 18, SpO2 94 %. Physical Exam  Examination patient has a good dorsalis pedis pulse there is no dystrophic changes no cellulitis.  Patient has a cannulated screw that is close to the articular surface.  Patient has impingement symptoms of the ankle that is improved with steroid injection. Assessment/Plan Assessment: Impingement right ankle status post subtalar fusion.  Plan: We will plan for removal of the screw for the subtalar fusion.  Plan for ankle arthroscopy.  Risks and benefits were discussed including persistent pain need for additional surgery.  Patient states she understands  wished to proceed at this time.  Newt Minion, MD 07/20/2022, 7:04 AM

## 2022-07-20 NOTE — Anesthesia Procedure Notes (Signed)
Procedure Name: LMA Insertion Date/Time: 07/20/2022 8:56 AM  Performed by: Georgia Duff, CRNAPre-anesthesia Checklist: Patient identified, Emergency Drugs available, Suction available and Patient being monitored Patient Re-evaluated:Patient Re-evaluated prior to induction Oxygen Delivery Method: Circle System Utilized Preoxygenation: Pre-oxygenation with 100% oxygen Induction Type: IV induction Ventilation: Mask ventilation without difficulty LMA: LMA inserted LMA Size: 4.0 Number of attempts: 1 Airway Equipment and Method: Bite block Placement Confirmation: positive ETCO2 Tube secured with: Tape Dental Injury: Teeth and Oropharynx as per pre-operative assessment

## 2022-07-20 NOTE — Transfer of Care (Signed)
Immediate Anesthesia Transfer of Care Note  Patient: Kayla Chaney  Procedure(s) Performed: REMOVAL SCREW RIGHT ANKLE (Right) RIGHT ANKLE ARTHROSCOPY (Right: Ankle)  Patient Location: PACU  Anesthesia Type:General  Level of Consciousness: drowsy  Airway & Oxygen Therapy: Patient Spontanous Breathing  Post-op Assessment: Report given to RN and Post -op Vital signs reviewed and stable  Post vital signs: Reviewed and stable  Last Vitals:  Vitals Value Taken Time  BP 120/62 07/20/22 0935  Temp    Pulse 97 07/20/22 0938  Resp 15 07/20/22 0938  SpO2 94 % 07/20/22 0938  Vitals shown include unvalidated device data.  Last Pain:  Vitals:   07/20/22 0825  PainSc: 0-No pain      Patients Stated Pain Goal: 0 (09/81/19 1478)  Complications: No notable events documented.

## 2022-07-21 ENCOUNTER — Encounter (HOSPITAL_COMMUNITY): Payer: Self-pay | Admitting: Orthopedic Surgery

## 2022-07-26 ENCOUNTER — Encounter: Payer: Self-pay | Admitting: Orthopedic Surgery

## 2022-07-26 NOTE — Telephone Encounter (Signed)
Can you call her and get her in 2 week post op appt? She has one scheduled on 12/12, which is too far out. Her surgery was on 07/20/22, so 2 weeks from then, thank you!

## 2022-07-26 NOTE — Telephone Encounter (Signed)
She is scheduled for post op on 08/09/22, ankle scope was on 07/20/22, can offer her a sooner appt if needed. Any restrictions? She wants to return to work tomorrow.

## 2022-08-02 ENCOUNTER — Ambulatory Visit (INDEPENDENT_AMBULATORY_CARE_PROVIDER_SITE_OTHER): Payer: No Typology Code available for payment source | Admitting: Orthopedic Surgery

## 2022-08-02 DIAGNOSIS — M25571 Pain in right ankle and joints of right foot: Secondary | ICD-10-CM

## 2022-08-03 ENCOUNTER — Other Ambulatory Visit (HOSPITAL_COMMUNITY): Payer: Self-pay

## 2022-08-08 ENCOUNTER — Encounter: Payer: Self-pay | Admitting: Orthopedic Surgery

## 2022-08-08 NOTE — Progress Notes (Signed)
Office Visit Note   Patient: Kayla Chaney           Date of Birth: February 22, 1976           MRN: [id number] Visit Date: 08/02/2022              Requested by: Johnette Abraham, MD [address] [place],  [place] [address] PCP: Johnette Abraham, MD  Chief Complaint  Patient presents with   Right Ankle - Routine Post Op    07/20/2022 right ankle scope removal of screw and scope       HPI: Patient is a 46 year old woman who presents status post right ankle arthroscopy removal of deep retained hardware.  She did have osteochondral changes over the anterior talar dome but no osteochondral changes under the weightbearing surface.  Assessment & Plan: Visit Diagnoses:  1. Pain in right ankle and joints of right foot     Plan: The remaining suture in the heel was removed.  She will increase her weightbearing as tolerated.  Follow-Up Instructions: Return in about 4 weeks (around 08/30/2022).   Ortho Exam  Patient is alert, oriented, no adenopathy, well-dressed, normal affect, normal respiratory effort. Examination the portals are clean and dry incisions are well-healed no redness or cellulitis.  Imaging: No results found. No images are attached to the encounter.  Labs: Lab Results  Component Value Date   LABORGA ESCHERICHIA COLI 10/28/2014     Lab Results  Component Value Date   ALBUMIN 4.4 07/03/2020   ALBUMIN 4.5 08/13/2019   ALBUMIN 4.5 01/25/2019    No results found for: "MG" Lab Results  Component Value Date   VD25OH 38.5 01/25/2019    No results found for: "PREALBUMIN"    Latest Ref Rng & Units 07/20/2022    7:05 AM 10/27/2021    7:38 AM 08/13/2019    9:36 AM  CBC EXTENDED  WBC 4.0 - 10.5 K/uL 7.3  5.7  4.7   RBC 3.87 - 5.11 MIL/uL 4.48  4.01  4.30   Hemoglobin 12.0 - 15.0 g/dL 14.4  12.6  14.2   HCT 36.0 - 46.0 % 40.7  36.7  40.5   Platelets 150 - 400 K/uL 278  245  203      There is no height or weight on file to calculate BMI.  Orders:  No  orders of the defined types were placed in this encounter.  No orders of the defined types were placed in this encounter.    Procedures: No procedures performed  Clinical Data: No additional findings.  ROS:  All other systems negative, except as noted in the HPI. Review of Systems  Objective: Vital Signs: There were no vitals taken for this visit.  Specialty Comments:  No specialty comments available.  PMFS History: Patient Active Problem List   Diagnosis Date Noted   Impingement syndrome of right ankle 07/20/2022   Pain from implanted hardware 07/20/2022   Encounter for annual general medical examination with abnormal findings in adult 07/13/2022   Posterior tibialis tendon insufficiency    Otitis externa, left 07/25/2021   Acute otalgia, left 07/23/2021   Palpitations 07/09/2021   Hot flashes due to menopause 07/09/2021   IUD (intrauterine device) in place 07/09/2021   Hyperlipemia 07/09/2021   Insomnia 11/13/2013   Degenerative joint disease of cervical spine 11/13/2013   Past Medical History:  Diagnosis Date   Complication of anesthesia    COVID 08/2020   mild case  Hot flashes due to menopause 07/09/2021   Hyperlipidemia    IUD (intrauterine device) in place 07/09/2021   PONV (postoperative nausea and vomiting)    just nausea    Family History  Problem Relation Age of Onset   Stroke Mother    Hypertension Father    Diabetes Father    Brain cancer Sister    Stroke Maternal Grandmother     Past Surgical History:  Procedure Laterality Date   ANKLE ARTHROSCOPY Right 07/20/2022   Procedure: RIGHT ANKLE ARTHROSCOPY;  Surgeon: Newt Minion, MD;  Location: [facility];  Service: Orthopedics;  Laterality: Right;   CESAREAN SECTION  2003, 2008   COLONOSCOPY WITH PROPOFOL N/A 11/20/2020   Procedure: COLONOSCOPY WITH PROPOFOL;  Surgeon: Eloise Harman, DO;  [facility];  Service: Endoscopy;  Laterality: N/A;  am/ASA II, pt tested +1/24 thru [facility]  at work <90 - results emailed to [place] - advised pt current arrival time of 7:00 - needed to know for transportation cy 3/21   [place] N/A 2008   Retained placenta from c-section birth   FOOT ARTHRODESIS Right 10/27/2021   Procedure: RIGHT SUBTALAR AND TALONAVICULAR FUSION;  Surgeon: Newt Minion, MD;  Location: [facility];  Service: Orthopedics;  Laterality: Right;   HARDWARE REMOVAL Right 07/20/2022   Procedure: REMOVAL SCREW RIGHT ANKLE;  Surgeon: Newt Minion, MD;  Location: [facility];  Service: Orthopedics;  Laterality: Right;   POLYPECTOMY  11/20/2020   Procedure: POLYPECTOMY;  Surgeon: Eloise Harman, DO;  [facility];  Service: Endoscopy;;   Social History   Occupational History    Comment: XRAY tech  Tobacco Use   Smoking status: Never   Smokeless tobacco: Never  Vaping Use   Vaping Use: Never used  Substance and Sexual Activity   Alcohol use: Yes    Comment: occ   Drug use: No   Sexual activity: Yes    Birth control/protection: I.U.D.

## 2022-08-09 ENCOUNTER — Encounter: Payer: No Typology Code available for payment source | Admitting: Orthopedic Surgery

## 2022-08-18 ENCOUNTER — Encounter (HOSPITAL_COMMUNITY): Payer: Self-pay

## 2022-08-18 ENCOUNTER — Encounter: Payer: Self-pay | Admitting: Orthopedic Surgery

## 2022-08-18 NOTE — Therapy (Signed)
PHYSICAL THERAPY DISCHARGE SUMMARY  Visits from Start of Care: 3  Current functional level related to goals / functional outcomes: na   Remaining deficits: na   Education / Equipment: na   Patient agrees to discharge. Patient goals were partially met. Patient is being discharged due to not returning since the last visit.

## 2022-08-18 NOTE — Telephone Encounter (Signed)
Scheduled

## 2022-08-19 ENCOUNTER — Other Ambulatory Visit (HOSPITAL_COMMUNITY): Payer: Self-pay

## 2022-08-19 ENCOUNTER — Other Ambulatory Visit: Payer: Self-pay

## 2022-08-19 ENCOUNTER — Encounter: Payer: Self-pay | Admitting: Family

## 2022-08-19 ENCOUNTER — Ambulatory Visit (INDEPENDENT_AMBULATORY_CARE_PROVIDER_SITE_OTHER): Payer: No Typology Code available for payment source | Admitting: Family

## 2022-08-19 DIAGNOSIS — M25571 Pain in right ankle and joints of right foot: Secondary | ICD-10-CM

## 2022-08-19 MED ORDER — DICLOFENAC EPOLAMINE 1.3 % EX PTCH
1.0000 | MEDICATED_PATCH | Freq: Two times a day (BID) | CUTANEOUS | 0 refills | Status: DC
  Filled 2022-08-19: qty 60, 30d supply, fill #0

## 2022-08-19 NOTE — Progress Notes (Signed)
Post-Op Visit Note   Patient: Kayla Chaney           Date of Birth: 1975/09/19           MRN: [id number] Visit Date: 08/19/2022 PCP: Johnette Abraham, MD  Chief Complaint:  Chief Complaint  Patient presents with   Right Ankle - Routine Post Op    07/20/2022 right ankle screw removal     HPI:  HPI The patient is a 46 year old woman seen status post right ankle hardware removal November 22 she has continued to have lateral ankle pain and along the joint line.  She is having difficulty performing her work duties due to ankle pain especially with weightbearing  Reports that Relafen is causing GI upset.  Ortho Exam On examination of the right ankle she has tenderness along the anterior joint line no significant edema her incisions are well-healed  Visit Diagnoses: No diagnosis found.  Plan: Have called in the diclofenac patch to the [facility] per her request. This was not available for eprescribe, was called in. Depo-Medrol injection of the right ankle today.  Patient tolerated well.  Voiced relief at the time of injection.  She will follow-up with Dr. Sharol Given as needed.  Follow-Up Instructions: No follow-ups on file.   Imaging: No results found.  Orders:  No orders of the defined types were placed in this encounter.  No orders of the defined types were placed in this encounter.    PMFS History: Patient Active Problem List   Diagnosis Date Noted   Impingement syndrome of right ankle 07/20/2022   Pain from implanted hardware 07/20/2022   Encounter for annual general medical examination with abnormal findings in adult 07/13/2022   Posterior tibialis tendon insufficiency    Otitis externa, left 07/25/2021   Acute otalgia, left 07/23/2021   Palpitations 07/09/2021   Hot flashes due to menopause 07/09/2021   IUD (intrauterine device) in place 07/09/2021   Hyperlipemia 07/09/2021   Insomnia 11/13/2013   Degenerative joint disease of cervical spine 11/13/2013    Past Medical History:  Diagnosis Date   Complication of anesthesia    COVID 08/2020   mild case   Hot flashes due to menopause 07/09/2021   Hyperlipidemia    IUD (intrauterine device) in place 07/09/2021   PONV (postoperative nausea and vomiting)    just nausea    Family History  Problem Relation Age of Onset   Stroke Mother    Hypertension Father    Diabetes Father    Brain cancer Sister    Stroke Maternal Grandmother     Past Surgical History:  Procedure Laterality Date   ANKLE ARTHROSCOPY Right 07/20/2022   Procedure: RIGHT ANKLE ARTHROSCOPY;  Surgeon: Newt Minion, MD;  Location: [facility];  Service: Orthopedics;  Laterality: Right;   CESAREAN SECTION  2003, 2008   COLONOSCOPY WITH PROPOFOL N/A 11/20/2020   Procedure: COLONOSCOPY WITH PROPOFOL;  Surgeon: Eloise Harman, DO;  [facility];  Service: Endoscopy;  Laterality: N/A;  am/ASA II, pt tested +1/24 thru [facility] at work <90 - results emailed to [place] - advised pt current arrival time of 7:00 - needed to know for transportation cy 3/21   [place] N/A 2008   Retained placenta from c-section birth   FOOT ARTHRODESIS Right 10/27/2021   Procedure: RIGHT SUBTALAR AND TALONAVICULAR FUSION;  Surgeon: Newt Minion, MD;  Location: [facility];  Service: Orthopedics;  Laterality: Right;   HARDWARE REMOVAL  Right 07/20/2022   Procedure: REMOVAL SCREW RIGHT ANKLE;  Surgeon: Newt Minion, MD;  Location: [facility];  Service: Orthopedics;  Laterality: Right;   POLYPECTOMY  11/20/2020   Procedure: POLYPECTOMY;  Surgeon: Eloise Harman, DO;  [facility];  Service: Endoscopy;;   Social History   Occupational History    Comment: XRAY tech  Tobacco Use   Smoking status: Never   Smokeless tobacco: Never  Vaping Use   Vaping Use: Never used  Substance and Sexual Activity   Alcohol use: Yes    Comment: occ   Drug use: No   Sexual activity: Yes    Birth control/protection:  I.U.D.

## 2022-08-23 ENCOUNTER — Other Ambulatory Visit (HOSPITAL_COMMUNITY): Payer: Self-pay

## 2022-08-23 ENCOUNTER — Other Ambulatory Visit: Payer: Self-pay

## 2022-08-24 ENCOUNTER — Ambulatory Visit (INDEPENDENT_AMBULATORY_CARE_PROVIDER_SITE_OTHER): Payer: No Typology Code available for payment source | Admitting: Obstetrics & Gynecology

## 2022-08-24 ENCOUNTER — Other Ambulatory Visit: Payer: Self-pay

## 2022-08-24 ENCOUNTER — Other Ambulatory Visit (HOSPITAL_COMMUNITY)
Admission: RE | Admit: 2022-08-24 | Discharge: 2022-08-24 | Disposition: A | Discharge status: Home / Self Care | Payer: No Typology Code available for payment source | Source: Ambulatory Visit | Attending: Obstetrics & Gynecology | Admitting: Obstetrics & Gynecology

## 2022-08-24 ENCOUNTER — Encounter: Payer: Self-pay | Admitting: Obstetrics & Gynecology

## 2022-08-24 ENCOUNTER — Other Ambulatory Visit (HOSPITAL_COMMUNITY): Payer: Self-pay

## 2022-08-24 VITALS — BP 114/66 | HR 76 | Ht 65.5 in | Wt 180.1 lb

## 2022-08-24 DIAGNOSIS — Z01419 Encounter for gynecological examination (general) (routine) without abnormal findings: Secondary | ICD-10-CM | POA: Insufficient documentation

## 2022-08-24 DIAGNOSIS — Z79890 Hormone replacement therapy (postmenopausal): Secondary | ICD-10-CM

## 2022-08-24 DIAGNOSIS — N951 Menopausal and female climacteric states: Secondary | ICD-10-CM

## 2022-08-24 MED ORDER — ESTRADIOL 0.5 MG PO TABS
0.5000 mg | ORAL_TABLET | Freq: Every day | ORAL | 4 refills | Status: DC
  Filled 2022-08-24: qty 90, fill #0
  Filled 2022-09-03 – 2022-11-01 (×2): qty 90, 90d supply, fill #0
  Filled 2023-01-25: qty 90, 90d supply, fill #1
  Filled 2023-04-13: qty 90, 90d supply, fill #2
  Filled 2023-05-08 – 2023-07-24 (×2): qty 90, 90d supply, fill #3

## 2022-08-24 NOTE — Progress Notes (Signed)
WELL-WOMAN EXAMINATION Patient name: Kayla Chaney MRN [id number]  Date of birth: 08-Jun-1976 Chief Complaint:   Annual Exam  History of Present Illness:   Kayla Chaney is a 46 y.o. G47P2002 female being seen today for a routine well-woman exam.    HRT: Doing well with current treatment.  Symptoms are well controlled and feels much better when she is taking estrogen.  She has Mirena for progesterone- records reviewed- placed 2016.  She will note occasional spotting, but light and irregular.  No LMP recorded. Patient is perimenopausal.  The current method of family planning is IUD.  07/27/2015- Mirena placed for progesterone due to HRT  Last pap 2020.  Last mammogram: scheduled. Last colonoscopy: 2022     08/24/2022    3:32 PM 07/13/2022    4:18 PM 08/27/2021   11:45 AM 07/23/2021   10:59 AM 07/09/2021    8:14 AM  Depression screen PHQ 2/9  Decreased Interest 0 0 0 0 0  Down, Depressed, Hopeless 0 0 0 0 0  PHQ - 2 Score 0 0 0 0 0  Altered sleeping 1 0 1 0   Tired, decreased energy 0 0 0 0   Change in appetite 1 2 0 0   Feeling bad or failure about yourself  0 0 0 0   Trouble concentrating 0 0 0 0   Moving slowly or fidgety/restless 0 0 0 0   Suicidal thoughts 0 0 0 0   PHQ-9 Score '2 2 1 '$ 0       Review of Systems:   Pertinent items are noted in HPI Denies any headaches, blurred vision, fatigue, shortness of breath, chest pain, abdominal pain, bowel movements, urination, or intercourse unless otherwise stated above.  Pertinent History Reviewed:  Reviewed past medical,surgical, social and family history.  Reviewed problem list, medications and allergies. Physical Assessment:   Vitals:   08/24/22 1531  BP: 114/66  Pulse: 76  Weight: 180 lb 2 oz (81.7 kg)  Height: 5' 5.5" (1.664 m)  Body mass index is 29.52 kg/m.        Physical Examination:   General appearance - well appearing, and in no distress  Mental status - alert, oriented to person, place, and  time  Psych:  She has a normal mood and affect  Skin - warm and dry, normal color, no suspicious lesions noted  Chest - effort normal, all lung fields clear to auscultation bilaterally  Heart - normal rate and regular rhythm  Neck:  midline trachea, no thyromegaly or nodules  Breasts - breasts appear normal, no suspicious masses, no skin or nipple changes or  axillary nodes  Abdomen - soft, nontender, nondistended, no masses or organomegaly  Pelvic - VULVA: normal appearing vulva with no masses, tenderness or lesions  VAGINA: normal appearing vagina with normal color and discharge, no lesions  CERVIX: normal appearing cervix without discharge or lesions, no CMT.  Strings visualized at os  Thin prep pap is done with HR HPV cotesting  UTERUS: uterus is felt to be normal size, shape, consistency and nontender   ADNEXA: No adnexal masses or tenderness noted.  Extremities:  No swelling or varicosities noted  Chaperone:  pt declined      Assessment & Plan:  1) Well-Woman Exam -pap collected, reviewed screening guidelines -other screening exams up to date  2) HRT IUD in place, '[]'$  due for replacement next year Continue with current estrogen  Meds:  Meds ordered this encounter  Medications  estradiol (ESTRACE) 0.5 MG tablet    Sig: Take 1 tablet (0.5 mg total) by mouth daily.    Dispense:  90 tablet    Refill:  4    Follow-up: Return in about 1 year (around 08/25/2023) for Annual with IUD replacement.   Janyth Pupa, DO Attending [place], [facility] for [employer], [place]

## 2022-08-25 ENCOUNTER — Other Ambulatory Visit: Payer: Self-pay

## 2022-08-26 ENCOUNTER — Other Ambulatory Visit: Payer: Self-pay | Admitting: Internal Medicine

## 2022-08-27 LAB — CBC WITH DIFFERENTIAL/PLATELET
Basophils Absolute: 0 10*3/uL (ref 0.0–0.2)
Basos: 0 %
EOS (ABSOLUTE): 0 10*3/uL (ref 0.0–0.4)
Eos: 0 %
Hematocrit: 39.2 % (ref 34.0–46.6)
Hemoglobin: 13.4 g/dL (ref 11.1–15.9)
Immature Grans (Abs): 0 10*3/uL (ref 0.0–0.1)
Immature Granulocytes: 0 %
Lymphocytes Absolute: 2.7 10*3/uL (ref 0.7–3.1)
Lymphs: 34 %
MCH: 31.3 pg (ref 26.6–33.0)
MCHC: 34.2 g/dL (ref 31.5–35.7)
MCV: 92 fL (ref 79–97)
Monocytes Absolute: 0.4 10*3/uL (ref 0.1–0.9)
Monocytes: 5 %
Neutrophils Absolute: 4.8 10*3/uL (ref 1.4–7.0)
Neutrophils: 61 %
Platelets: 274 10*3/uL (ref 150–450)
RBC: 4.28 x10E6/uL (ref 3.77–5.28)
RDW: 12.3 % (ref 11.7–15.4)
WBC: 7.9 10*3/uL (ref 3.4–10.8)

## 2022-08-27 LAB — LIPID PANEL
Chol/HDL Ratio: 6 ratio — ABNORMAL HIGH (ref 0.0–4.4)
Cholesterol, Total: 332 mg/dL — ABNORMAL HIGH (ref 100–199)
HDL: 55 mg/dL (ref >39)
LDL Chol Calc (NIH): 225 mg/dL — ABNORMAL HIGH (ref 0–99)
Triglycerides: 257 mg/dL — ABNORMAL HIGH (ref 0–149)
VLDL Cholesterol Cal: 52 mg/dL — ABNORMAL HIGH (ref 5–40)

## 2022-08-27 LAB — CMP14+EGFR
ALT: 15 IU/L (ref 0–32)
AST: 16 IU/L (ref 0–40)
Albumin/Globulin Ratio: 1.7 (ref 1.2–2.2)
Albumin: 4.5 g/dL (ref 3.9–4.9)
Alkaline Phosphatase: 72 IU/L (ref 44–121)
BUN/Creatinine Ratio: 22 (ref 9–23)
BUN: 14 mg/dL (ref 6–24)
Bilirubin Total: 0.3 mg/dL (ref 0.0–1.2)
CO2: 22 mmol/L (ref 20–29)
Calcium: 9.6 mg/dL (ref 8.7–10.2)
Chloride: 99 mmol/L (ref 96–106)
Creatinine, Ser: 0.63 mg/dL (ref 0.57–1.00)
Globulin, Total: 2.6 g/dL (ref 1.5–4.5)
Glucose: 81 mg/dL (ref 70–99)
Potassium: 4.1 mmol/L (ref 3.5–5.2)
Sodium: 138 mmol/L (ref 134–144)
Total Protein: 7.1 g/dL (ref 6.0–8.5)
eGFR: 110 mL/min/{1.73_m2} (ref >59)

## 2022-08-27 LAB — B12 AND FOLATE PANEL
Folate: >20 ng/mL (ref >3.0)
Vitamin B-12: 385 pg/mL (ref 232–1245)

## 2022-08-27 LAB — HEMOGLOBIN A1C
Est. average glucose Bld gHb Est-mCnc: 105 mg/dL
Hgb A1c MFr Bld: 5.3 % (ref 4.8–5.6)

## 2022-08-27 LAB — TSH+FREE T4
Free T4: 1.07 ng/dL (ref 0.82–1.77)
TSH: 1.98 u[IU]/mL (ref 0.450–4.500)

## 2022-08-27 LAB — VITAMIN D 25 HYDROXY (VIT D DEFICIENCY, FRACTURES): Vit D, 25-Hydroxy: 36.8 ng/mL (ref 30.0–100.0)

## 2022-08-30 LAB — CYTOLOGY - PAP
Comment: NEGATIVE
Diagnosis: NEGATIVE
High risk HPV: NEGATIVE

## 2022-09-03 ENCOUNTER — Other Ambulatory Visit (HOSPITAL_COMMUNITY): Payer: Self-pay

## 2022-09-03 ENCOUNTER — Encounter: Payer: Self-pay | Admitting: Internal Medicine

## 2022-09-03 DIAGNOSIS — E782 Mixed hyperlipidemia: Secondary | ICD-10-CM

## 2022-09-03 DIAGNOSIS — Z789 Other specified health status: Secondary | ICD-10-CM

## 2022-09-05 ENCOUNTER — Encounter: Payer: Self-pay | Admitting: Internal Medicine

## 2022-09-06 ENCOUNTER — Other Ambulatory Visit (HOSPITAL_COMMUNITY): Payer: Self-pay

## 2022-09-06 MED ORDER — REPATHA 140 MG/ML ~~LOC~~ SOSY
140.0000 mg | PREFILLED_SYRINGE | SUBCUTANEOUS | 0 refills | Status: DC
  Filled 2022-09-06: qty 6, 84d supply, fill #0

## 2022-09-07 ENCOUNTER — Other Ambulatory Visit (HOSPITAL_COMMUNITY): Payer: Self-pay

## 2022-09-12 ENCOUNTER — Other Ambulatory Visit (HOSPITAL_COMMUNITY): Payer: Self-pay

## 2022-09-15 ENCOUNTER — Other Ambulatory Visit: Payer: Self-pay

## 2022-09-20 ENCOUNTER — Encounter: Payer: Self-pay | Admitting: Orthopedic Surgery

## 2022-09-21 ENCOUNTER — Other Ambulatory Visit (HOSPITAL_COMMUNITY): Payer: Self-pay

## 2022-09-21 ENCOUNTER — Other Ambulatory Visit: Payer: Self-pay | Admitting: Family

## 2022-09-21 ENCOUNTER — Other Ambulatory Visit: Payer: Self-pay

## 2022-09-21 MED ORDER — DICLOFENAC EPOLAMINE 1.3 % EX PTCH
1.0000 | MEDICATED_PATCH | Freq: Two times a day (BID) | CUTANEOUS | 2 refills | Status: DC
  Filled 2022-09-21: qty 60, 30d supply, fill #0
  Filled 2022-11-01: qty 60, 30d supply, fill #1
  Filled 2022-12-01: qty 60, 30d supply, fill #2

## 2022-09-22 ENCOUNTER — Other Ambulatory Visit: Payer: Self-pay

## 2022-09-22 ENCOUNTER — Other Ambulatory Visit (HOSPITAL_COMMUNITY): Payer: Self-pay

## 2022-09-23 ENCOUNTER — Other Ambulatory Visit: Payer: Self-pay

## 2022-10-03 ENCOUNTER — Encounter: Payer: Self-pay | Admitting: Orthopedic Surgery

## 2022-10-20 ENCOUNTER — Other Ambulatory Visit (HOSPITAL_COMMUNITY): Payer: Self-pay

## 2022-10-27 ENCOUNTER — Encounter: Payer: Self-pay | Admitting: Radiology

## 2022-11-01 ENCOUNTER — Other Ambulatory Visit: Payer: Self-pay

## 2022-11-01 ENCOUNTER — Other Ambulatory Visit (HOSPITAL_COMMUNITY): Payer: Self-pay

## 2022-11-01 ENCOUNTER — Other Ambulatory Visit: Payer: Self-pay | Admitting: Internal Medicine

## 2022-11-01 DIAGNOSIS — G479 Sleep disorder, unspecified: Secondary | ICD-10-CM

## 2022-11-01 MED ORDER — HYDROXYZINE HCL 10 MG PO TABS
20.0000 mg | ORAL_TABLET | Freq: Every evening | ORAL | 1 refills | Status: DC | PRN
  Filled 2022-11-01: qty 90, 45d supply, fill #0

## 2022-11-02 ENCOUNTER — Other Ambulatory Visit: Payer: Self-pay

## 2022-11-10 ENCOUNTER — Ambulatory Visit: Payer: 59 | Admitting: Orthopedic Surgery

## 2022-11-10 ENCOUNTER — Encounter: Payer: Self-pay | Admitting: Orthopedic Surgery

## 2022-11-10 ENCOUNTER — Other Ambulatory Visit (INDEPENDENT_AMBULATORY_CARE_PROVIDER_SITE_OTHER): Payer: 59

## 2022-11-10 DIAGNOSIS — M25571 Pain in right ankle and joints of right foot: Secondary | ICD-10-CM

## 2022-11-10 NOTE — Progress Notes (Signed)
Office Visit Note   Patient: Kayla Chaney           Date of Birth: [birth year]           MRN: [id number] Visit Date: 11/10/2022              Requested by: Johnette Abraham, MD [place],  [place] [id number] PCP: Johnette Abraham, MD  Chief Complaint  Patient presents with   Right Ankle - Pain    07/20/2022 right ankle screw removal      HPI: Patient is a 47 year old woman who is status post talonavicular and subtalar fusion and subsequent ankle arthroscopy with removal of deep retained hardware.  Patient states she had been doing well and then went to a Zumba and a line dancing class and had acute pain afterwards.  Assessment & Plan: Visit Diagnoses:  1. Pain in right ankle and joints of right foot     Plan: Recommended that she could resume a short course of prednisone 10 mg with breakfast she was given instructions and demonstrated isometric strengthening for the right lower extremity.  Follow-Up Instructions: No follow-ups on file.   Ortho Exam  Patient is alert, oriented, no adenopathy, well-dressed, normal affect, normal respiratory effort. Examination she has a good pulse she has good sensation over the dorsum of her foot plantarly she has some neuropathy changes beneath the metatarsal heads.  She has good ankle and midfoot range of motion.  There is no redness no cellulitis no swelling.  Imaging: XR Ankle Complete Right  Result Date: 11/10/2022 Three-view radiographs of the right ankle shows a congruent mortise the hardware shows no loosening the fusions are stable.  No images are attached to the encounter.  Labs: Lab Results  Component Value Date   HGBA1C 5.3 08/26/2022   LABORGA ESCHERICHIA COLI 10/28/2014     Lab Results  Component Value Date   ALBUMIN 4.5 08/26/2022   ALBUMIN 4.4 07/03/2020   ALBUMIN 4.5 08/13/2019    No results found for: "MG" Lab Results  Component Value Date   VD25OH 36.8 08/26/2022   VD25OH 38.5 01/25/2019     No results found for: "PREALBUMIN"    Latest Ref Rng & Units 08/26/2022   12:32 PM 07/20/2022    7:05 AM 10/27/2021    7:38 AM  CBC EXTENDED  WBC 3.4 - 10.8 x10E3/uL 7.9  7.3  5.7   RBC 3.77 - 5.28 x10E6/uL 4.28  4.48  4.01   Hemoglobin 11.1 - 15.9 g/dL 13.4  14.4  12.6   HCT 34.0 - 46.6 % 39.2  40.7  36.7   Platelets 150 - 450 x10E3/uL 274  278  245   NEUT# 1.4 - 7.0 x10E3/uL 4.8     Lymph# 0.7 - 3.1 x10E3/uL 2.7        There is no height or weight on file to calculate BMI.  Orders:  Orders Placed This Encounter  Procedures   XR Ankle Complete Right   No orders of the defined types were placed in this encounter.    Procedures: No procedures performed  Clinical Data: No additional findings.  ROS:  All other systems negative, except as noted in the HPI. Review of Systems  Objective: Vital Signs: There were no vitals taken for this visit.  Specialty Comments:  No specialty comments available.  PMFS History: Patient Active Problem List   Diagnosis Date Noted   Impingement syndrome of right ankle 07/20/2022  Pain from implanted hardware 07/20/2022   Encounter for annual general medical examination with abnormal findings in adult 07/13/2022   Posterior tibialis tendon insufficiency    Otitis externa, left 07/25/2021   Acute otalgia, left 07/23/2021   Palpitations 07/09/2021   Hot flashes due to menopause 07/09/2021   IUD (intrauterine device) in place 07/09/2021   Hyperlipemia 07/09/2021   Insomnia 11/13/2013   Degenerative joint disease of cervical spine 11/13/2013   Past Medical History:  Diagnosis Date   Complication of anesthesia    COVID 08/2020   mild case   Hot flashes due to menopause 07/09/2021   Hyperlipidemia    IUD (intrauterine device) in place 07/09/2021   PONV (postoperative nausea and vomiting)    just nausea    Family History  Problem Relation Age of Onset   Stroke Mother    Hypertension Father    Diabetes Father    Brain  cancer Sister    Stroke Maternal Grandmother     Past Surgical History:  Procedure Laterality Date   ANKLE ARTHROSCOPY Right 07/20/2022   Procedure: RIGHT ANKLE ARTHROSCOPY;  Surgeon: Newt Minion, MD;  Location: [place];  Service: Orthopedics;  Laterality: Right;   CESAREAN SECTION  2003, 2008   COLONOSCOPY WITH PROPOFOL N/A 11/20/2020   Procedure: COLONOSCOPY WITH PROPOFOL;  Surgeon: Eloise Harman, DO;  [facility];  Service: Endoscopy;  Laterality: N/A;  am/ASA II, pt tested +1/24 thru [employer] [facility] <90 - results emailed to [facility] - advised pt current arrival time of 7:00 - needed to know for transportation cy 3/21   [place] N/A 2008   Retained placenta from c-section birth   FOOT ARTHRODESIS Right 10/27/2021   Procedure: RIGHT SUBTALAR AND TALONAVICULAR FUSION;  Surgeon: Newt Minion, MD;  Location: [place];  Service: Orthopedics;  Laterality: Right;   HARDWARE REMOVAL Right 07/20/2022   Procedure: REMOVAL SCREW RIGHT ANKLE;  Surgeon: Newt Minion, MD;  Location: [facility];  Service: Orthopedics;  Laterality: Right;   POLYPECTOMY  11/20/2020   Procedure: POLYPECTOMY;  Surgeon: Eloise Harman, DO;  [facility];  Service: Endoscopy;;   Social History   Occupational History    Comment: XRAY tech  Tobacco Use   Smoking status: Never   Smokeless tobacco: Never  Vaping Use   Vaping Use: Never used  Substance and Sexual Activity   Alcohol use: Yes    Comment: occ   Drug use: No   Sexual activity: Yes    Birth control/protection: I.U.D.

## 2022-11-18 ENCOUNTER — Encounter: Payer: Self-pay | Admitting: Orthopedic Surgery

## 2022-11-18 ENCOUNTER — Other Ambulatory Visit (HOSPITAL_COMMUNITY): Payer: Self-pay

## 2022-11-18 ENCOUNTER — Other Ambulatory Visit: Payer: Self-pay | Admitting: Orthopedic Surgery

## 2022-11-18 MED ORDER — GABAPENTIN 300 MG PO CAPS
300.0000 mg | ORAL_CAPSULE | Freq: Three times a day (TID) | ORAL | 3 refills | Status: DC
  Filled 2022-11-18: qty 90, 30d supply, fill #0
  Filled 2022-11-22 – 2022-12-21 (×3): qty 90, 30d supply, fill #1
  Filled 2023-02-19: qty 90, 30d supply, fill #2
  Filled 2023-03-17: qty 90, 30d supply, fill #3

## 2022-11-21 ENCOUNTER — Other Ambulatory Visit (INDEPENDENT_AMBULATORY_CARE_PROVIDER_SITE_OTHER): Payer: 59

## 2022-11-21 ENCOUNTER — Other Ambulatory Visit: Payer: Self-pay

## 2022-11-21 ENCOUNTER — Ambulatory Visit: Payer: 59 | Admitting: Orthopedic Surgery

## 2022-11-21 ENCOUNTER — Encounter: Payer: Self-pay | Admitting: Orthopedic Surgery

## 2022-11-21 ENCOUNTER — Other Ambulatory Visit (HOSPITAL_COMMUNITY): Payer: Self-pay

## 2022-11-21 DIAGNOSIS — M25571 Pain in right ankle and joints of right foot: Secondary | ICD-10-CM

## 2022-11-21 MED ORDER — GABAPENTIN 100 MG PO CAPS
100.0000 mg | ORAL_CAPSULE | Freq: Three times a day (TID) | ORAL | 3 refills | Status: DC
  Filled 2022-11-21: qty 90, 30d supply, fill #0
  Filled 2022-12-20 – 2022-12-21 (×2): qty 90, 30d supply, fill #1

## 2022-11-21 MED ORDER — AMITRIPTYLINE HCL 25 MG PO TABS
25.0000 mg | ORAL_TABLET | Freq: Every day | ORAL | 3 refills | Status: DC
  Filled 2022-11-21: qty 30, 30d supply, fill #0
  Filled 2022-12-20 – 2022-12-21 (×2): qty 30, 30d supply, fill #1

## 2022-11-21 NOTE — Progress Notes (Signed)
Office Visit Note   Patient: Kayla Chaney           Date of Birth: 1976/06/06           MRN: [id number] Visit Date: 11/21/2022              Requested by: Johnette Abraham, MD [place],  [place] [id number] PCP: Johnette Abraham, MD  Chief Complaint  Patient presents with   Right Ankle - Pain      HPI: Patient is a 47 year old woman who is seen in follow-up status post subtalar and talonavicular fusion as well as ankle arthroscopy with removal of a screw.  Patient describes pain radiating in a nerve root distribution.  She states the prednisone did not help.  Assessment & Plan: Visit Diagnoses:  1. Pain in right ankle and joints of right foot     Plan: Patient's symptoms seem to be more consistent with complex regional pain syndrome of the superficial peroneal nerve.  Patient will be started on a lower dose of Neurontin 100 mg 3 times a day and Elavil at night.  Discussed that we may need to include Cymbalta.  Follow-Up Instructions: Return in about 2 weeks (around 12/05/2022).   Ortho Exam  Patient is alert, oriented, no adenopathy, well-dressed, normal affect, normal respiratory effort. Examination patient has tingling and burning reproduced with palpation of the superficial peroneal nerve where it exits the anterior compartment.  There are no dystrophic changes in her foot noted changes in skin color or temperature.  Imaging: XR Ankle Complete Right  Result Date: 11/21/2022 Three-view radiographs of the right ankle shows no joint line collapse the joint is congruent.  Hardware is intact without lytic changes around the hardware.  No images are attached to the encounter.  Labs: Lab Results  Component Value Date   HGBA1C 5.3 08/26/2022   LABORGA ESCHERICHIA COLI 10/28/2014     Lab Results  Component Value Date   ALBUMIN 4.5 08/26/2022   ALBUMIN 4.4 07/03/2020   ALBUMIN 4.5 08/13/2019    No results found for: "MG" Lab Results  Component Value  Date   VD25OH 36.8 08/26/2022   VD25OH 38.5 01/25/2019    No results found for: "PREALBUMIN"    Latest Ref Rng & Units 08/26/2022   12:32 PM 07/20/2022    7:05 AM 10/27/2021    7:38 AM  CBC EXTENDED  WBC 3.4 - 10.8 x10E3/uL 7.9  7.3  5.7   RBC 3.77 - 5.28 x10E6/uL 4.28  4.48  4.01   Hemoglobin 11.1 - 15.9 g/dL 13.4  14.4  12.6   HCT 34.0 - 46.6 % 39.2  40.7  36.7   Platelets 150 - 450 x10E3/uL 274  278  245   NEUT# 1.4 - 7.0 x10E3/uL 4.8     Lymph# 0.7 - 3.1 x10E3/uL 2.7        There is no height or weight on file to calculate BMI.  Orders:  Orders Placed This Encounter  Procedures   XR Ankle Complete Right   Meds ordered this encounter  Medications   gabapentin (NEURONTIN) 100 MG capsule    Sig: Take 1 capsule (100 mg total) by mouth 3 (three) times daily. When necessary for neuropathy pain    Dispense:  90 capsule    Refill:  3   amitriptyline (ELAVIL) 25 MG tablet    Sig: Take 1 tablet (25 mg total) by mouth at bedtime.    Dispense:  30 tablet    Refill:  3     Procedures: No procedures performed  Clinical Data: No additional findings.  ROS:  All other systems negative, except as noted in the HPI. Review of Systems  Objective: Vital Signs: There were no vitals taken for this visit.  Specialty Comments:  No specialty comments available.  PMFS History: Patient Active Problem List   Diagnosis Date Noted   Impingement syndrome of right ankle 07/20/2022   Pain from implanted hardware 07/20/2022   Encounter for annual general medical examination with abnormal findings in adult 07/13/2022   Posterior tibialis tendon insufficiency    Otitis externa, left 07/25/2021   Acute otalgia, left 07/23/2021   Palpitations 07/09/2021   Hot flashes due to menopause 07/09/2021   IUD (intrauterine device) in place 07/09/2021   Hyperlipemia 07/09/2021   Insomnia 11/13/2013   Degenerative joint disease of cervical spine 11/13/2013   Past Medical History:  Diagnosis  Date   Complication of anesthesia    COVID 08/2020   mild case   Hot flashes due to menopause 07/09/2021   Hyperlipidemia    IUD (intrauterine device) in place 07/09/2021   PONV (postoperative nausea and vomiting)    just nausea    Family History  Problem Relation Age of Onset   Stroke Mother    Hypertension Father    Diabetes Father    Brain cancer Sister    Stroke Maternal Grandmother     Past Surgical History:  Procedure Laterality Date   ANKLE ARTHROSCOPY Right 07/20/2022   Procedure: RIGHT ANKLE ARTHROSCOPY;  Surgeon: Newt Minion, MD;  Location: [place];  Service: Orthopedics;  Laterality: Right;   CESAREAN SECTION  2003, 2008   COLONOSCOPY WITH PROPOFOL N/A 11/20/2020   Procedure: COLONOSCOPY WITH PROPOFOL;  Surgeon: Eloise Harman, DO;  [facility];  Service: Endoscopy;  Laterality: N/A;  am/ASA II, pt tested +1/24 thru [employer] [facility] <90 - results emailed to [place] - advised pt current arrival time of 7:00 - needed to know for transportation cy 3/21   [place] N/A 2008   Retained placenta from c-section birth   FOOT ARTHRODESIS Right 10/27/2021   Procedure: RIGHT SUBTALAR AND TALONAVICULAR FUSION;  Surgeon: Newt Minion, MD;  Location: [facility];  Service: Orthopedics;  Laterality: Right;   HARDWARE REMOVAL Right 07/20/2022   Procedure: REMOVAL SCREW RIGHT ANKLE;  Surgeon: Newt Minion, MD;  Location: [place];  Service: Orthopedics;  Laterality: Right;   POLYPECTOMY  11/20/2020   Procedure: POLYPECTOMY;  Surgeon: Eloise Harman, DO;  [facility];  Service: Endoscopy;;   Social History   Occupational History    Comment: XRAY tech  Tobacco Use   Smoking status: Never   Smokeless tobacco: Never  Vaping Use   Vaping Use: Never used  Substance and Sexual Activity   Alcohol use: Yes    Comment: occ   Drug use: No   Sexual activity: Yes    Birth control/protection: I.U.D.

## 2022-11-23 ENCOUNTER — Other Ambulatory Visit: Payer: Self-pay

## 2022-11-28 ENCOUNTER — Encounter: Payer: Self-pay | Admitting: Orthopedic Surgery

## 2022-12-01 ENCOUNTER — Other Ambulatory Visit: Payer: Self-pay

## 2022-12-01 ENCOUNTER — Other Ambulatory Visit: Payer: Self-pay | Admitting: Internal Medicine

## 2022-12-01 DIAGNOSIS — Z789 Other specified health status: Secondary | ICD-10-CM

## 2022-12-01 DIAGNOSIS — E782 Mixed hyperlipidemia: Secondary | ICD-10-CM

## 2022-12-01 MED ORDER — REPATHA 140 MG/ML ~~LOC~~ SOSY
140.0000 mg | PREFILLED_SYRINGE | SUBCUTANEOUS | 0 refills | Status: DC
  Filled 2022-12-01: qty 6, 84d supply, fill #0

## 2022-12-02 ENCOUNTER — Other Ambulatory Visit: Payer: Self-pay

## 2022-12-02 ENCOUNTER — Other Ambulatory Visit (HOSPITAL_COMMUNITY): Payer: Self-pay

## 2022-12-05 ENCOUNTER — Other Ambulatory Visit (HOSPITAL_COMMUNITY): Payer: Self-pay

## 2022-12-05 ENCOUNTER — Encounter (HOSPITAL_COMMUNITY): Payer: Self-pay

## 2022-12-06 ENCOUNTER — Other Ambulatory Visit (HOSPITAL_COMMUNITY): Payer: Self-pay

## 2022-12-06 ENCOUNTER — Encounter (HOSPITAL_COMMUNITY): Payer: Self-pay

## 2022-12-07 ENCOUNTER — Other Ambulatory Visit: Payer: Self-pay

## 2022-12-07 ENCOUNTER — Encounter: Payer: Self-pay | Admitting: Orthopedic Surgery

## 2022-12-07 DIAGNOSIS — M76829 Posterior tibial tendinitis, unspecified leg: Secondary | ICD-10-CM

## 2022-12-07 DIAGNOSIS — M25571 Pain in right ankle and joints of right foot: Secondary | ICD-10-CM

## 2022-12-13 ENCOUNTER — Other Ambulatory Visit (HOSPITAL_COMMUNITY): Payer: Self-pay | Admitting: Internal Medicine

## 2022-12-13 DIAGNOSIS — Z1231 Encounter for screening mammogram for malignant neoplasm of breast: Secondary | ICD-10-CM

## 2022-12-16 ENCOUNTER — Other Ambulatory Visit: Payer: Self-pay | Admitting: Orthopedic Surgery

## 2022-12-16 ENCOUNTER — Other Ambulatory Visit (HOSPITAL_COMMUNITY): Payer: Self-pay

## 2022-12-16 ENCOUNTER — Other Ambulatory Visit: Payer: Self-pay

## 2022-12-16 MED ORDER — PREDNISONE 10 MG PO TABS
10.0000 mg | ORAL_TABLET | Freq: Every day | ORAL | 1 refills | Status: DC
  Filled 2022-12-16: qty 30, 30d supply, fill #0

## 2022-12-19 ENCOUNTER — Ambulatory Visit (HOSPITAL_COMMUNITY)
Admission: RE | Admit: 2022-12-19 | Discharge: 2022-12-19 | Disposition: A | Discharge status: Home / Self Care | Payer: 59 | Source: Ambulatory Visit | Attending: Internal Medicine | Admitting: Internal Medicine

## 2022-12-19 ENCOUNTER — Encounter (HOSPITAL_COMMUNITY): Payer: Self-pay

## 2022-12-19 ENCOUNTER — Ambulatory Visit (HOSPITAL_COMMUNITY): Admission: RE | Admit: 2022-12-19 | Payer: 59 | Source: Ambulatory Visit

## 2022-12-19 DIAGNOSIS — Z1231 Encounter for screening mammogram for malignant neoplasm of breast: Secondary | ICD-10-CM

## 2022-12-20 ENCOUNTER — Other Ambulatory Visit (HOSPITAL_COMMUNITY): Payer: Self-pay

## 2022-12-21 ENCOUNTER — Other Ambulatory Visit (HOSPITAL_COMMUNITY): Payer: Self-pay

## 2022-12-21 ENCOUNTER — Other Ambulatory Visit: Payer: Self-pay

## 2022-12-27 ENCOUNTER — Encounter: Payer: 59 | Admitting: Physical Medicine and Rehabilitation

## 2023-01-03 ENCOUNTER — Ambulatory Visit: Payer: 59 | Admitting: Physical Medicine and Rehabilitation

## 2023-01-03 ENCOUNTER — Other Ambulatory Visit: Payer: Self-pay

## 2023-01-03 VITALS — BP 136/85 | HR 77

## 2023-01-03 DIAGNOSIS — G90521 Complex regional pain syndrome I of right lower limb: Secondary | ICD-10-CM

## 2023-01-03 MED ORDER — BUPIVACAINE HCL 0.25 % IJ SOLN
2.0000 mL | Freq: Once | INTRAMUSCULAR | Status: AC
Start: 2023-01-03 — End: 2023-01-03
  Administered 2023-01-03: 2 mL

## 2023-01-03 NOTE — Procedures (Signed)
Lumbar Sympathetic Block with Fluoroscopic Guidance  Patient: Kayla Chaney      Date of Birth: 1976/04/23 MRN: [id number] PCP: Billie Lade, MD      Visit Date: 01/03/2023   Universal Protocol:    Date/Time: 05/07/241:39 PM  Consent Given By: the patient  Position: PRONE  Additional Comments: Vital signs were monitored before and after the procedure. Patient was prepped and draped in the usual sterile fashion. The correct patient, procedure, and site was verified.   Injection Procedure Details:  Procedure Site One Meds Administered:  Meds ordered this encounter  Medications   bupivacaine (MARCAINE) 0.25 % (with pres) injection 2 mL     Laterality: Right  Location/Site:  L3 vertebral body  Needle size: 25 G  Needle type: spinal  Needle Placement: Lateral vertebral body  Findings:  -Comments: There was excellent flow contrast just ventral to the vertebral body with no vascular flow and no muscular contrast pattern.  There was good spread of contrast up and down to levels.  Procedure Details: The fluoroscope beam was manipulated to square off the endplates of the L3 vertebral body to achieve a true midline AP view.  An oblique view of the region overlying the superior  portion of the L3 vertebral body with the ipsilateral tip of the transverse process aligned with the ventral edge of the vertebral body was obtained under fluoroscopic visualization. The target is the outer edge of the upper third of the L3 vertebral body.  For the target described the skin was anesthetized with 1 ml of 1% Lidocaine without epinephrine. The spinal needle was inserted down to the inferior anterolateral aspect of the L3 vertebral body using biplanar imaging.   A 4ml volume of Omnipaque-240 was injected to make sure that the needle tip was not in a blood vessel or intra muscular and a standard fascial plane outline was obtained. Bi-planar imaging confirmed placement and appropriate images  documented.  The solution noted above was injected in this region slowly in 1 ml aliquots.    Additional Comments:  The patient tolerated the procedure well.  She did have somewhat of a small vasovagal episode at the end of the procedure. Dressing: Band-Aid    Post-procedure details: Patient was observed during the procedure. Post-procedure instructions were reviewed.  Patient left the clinic in stable condition.

## 2023-01-03 NOTE — Progress Notes (Signed)
Functional Pain Scale - descriptive words and definitions  Uncomfortable (3)  Pain is present but can complete all ADL's/sleep is slightly affected and passive distraction only gives marginal relief. Mild range order  Average Pain 2   +Driver, -BT, -Dye Allergies.  Right foot pain

## 2023-01-03 NOTE — Patient Instructions (Signed)

## 2023-01-04 ENCOUNTER — Other Ambulatory Visit (HOSPITAL_COMMUNITY): Payer: Self-pay

## 2023-01-04 ENCOUNTER — Encounter: Payer: Self-pay | Admitting: Orthopedic Surgery

## 2023-01-04 ENCOUNTER — Other Ambulatory Visit: Payer: Self-pay | Admitting: Orthopedic Surgery

## 2023-01-04 MED ORDER — DULOXETINE HCL 20 MG PO CPEP
20.0000 mg | ORAL_CAPSULE | Freq: Every day | ORAL | 3 refills | Status: DC
  Filled 2023-01-04 – 2023-01-06 (×2): qty 30, 30d supply, fill #0

## 2023-01-04 MED ORDER — AMITRIPTYLINE HCL 25 MG PO TABS
50.0000 mg | ORAL_TABLET | Freq: Every day | ORAL | 3 refills | Status: DC
  Filled 2023-01-04 – 2023-01-06 (×2): qty 30, 15d supply, fill #0

## 2023-01-06 ENCOUNTER — Encounter: Payer: Self-pay | Admitting: Physical Medicine and Rehabilitation

## 2023-01-06 ENCOUNTER — Other Ambulatory Visit (HOSPITAL_COMMUNITY): Payer: Self-pay

## 2023-01-06 ENCOUNTER — Other Ambulatory Visit: Payer: Self-pay

## 2023-01-06 ENCOUNTER — Other Ambulatory Visit: Payer: Self-pay | Admitting: Physical Medicine and Rehabilitation

## 2023-01-06 ENCOUNTER — Other Ambulatory Visit: Payer: Self-pay | Admitting: Orthopedic Surgery

## 2023-01-06 DIAGNOSIS — M25571 Pain in right ankle and joints of right foot: Secondary | ICD-10-CM

## 2023-01-06 DIAGNOSIS — M76829 Posterior tibial tendinitis, unspecified leg: Secondary | ICD-10-CM

## 2023-01-06 DIAGNOSIS — G90521 Complex regional pain syndrome I of right lower limb: Secondary | ICD-10-CM

## 2023-01-06 MED ORDER — AMITRIPTYLINE HCL 25 MG PO TABS
50.0000 mg | ORAL_TABLET | Freq: Every day | ORAL | 3 refills | Status: DC
  Filled 2023-01-06 – 2023-01-10 (×3): qty 30, 15d supply, fill #0

## 2023-01-10 ENCOUNTER — Other Ambulatory Visit (HOSPITAL_COMMUNITY): Payer: Self-pay

## 2023-01-10 ENCOUNTER — Other Ambulatory Visit: Payer: Self-pay | Admitting: Family

## 2023-01-10 ENCOUNTER — Other Ambulatory Visit: Payer: Self-pay | Admitting: Orthopedic Surgery

## 2023-01-10 DIAGNOSIS — M5030 Other cervical disc degeneration, unspecified cervical region: Secondary | ICD-10-CM

## 2023-01-10 NOTE — Progress Notes (Addendum)
Kayla Chaney - 47 y.o. female MRN [id number]  Date of birth: July 30, 1976  Office Visit Note: Visit Date: 01/03/2023 PCP: Billie Lade, MD Referred by: Billie Lade, MD  Subjective: Chief Complaint  Patient presents with   Right Foot - Pain   HPI:  Kayla Chaney is a 47 y.o. female who comes in today at the request of Dr. Aldean Baker for planned Right lumbar sympathetic block with fluoroscopic guidance.  The patient has failed conservative care including home exercise, medications, time and activity modification.  She has had 2 ankle surgeries and these can be reviewed through Dr. Audrie Lia notes.  With the first surgery she did appear to have discoloration and change in color and sweating and some swelling.  This seemed to get better with therapy and medication and then she had a second surgery and since that time has had continued paresthesias down the front of the leg to the dorsum of the foot.  At this point she does endorse some change in sensation and allodynia and some changes with temperature.  Today temperature seems to be the same side-to-side.  Depending on relief with sympathetic block would look at repeat block to see if there is further relief of symptoms.  If she gets some relief but is just not lasting would consider referral to [facility] if this just felt like it was really ongoing CRPS.  I did have a discussion with Kayla Chaney concerning medication management.  She likely could do well with Lyrica although she had her there are some issues with weight gain.  My experience has not been weight gain but there can be swelling.  She is really not taking enough of the gabapentin to probably make and affect and would likely need work up to 300 mg 3 times a day regularly and consistently.  This probably is going to be prohibitive with working etc. as it does make her drowsy and somewhat off balance.  We also discussed the use of something like duloxetine  which is a selective norepinephrine reuptake inhibitor.  Again this really does not usually cause weight gain and may be beneficial in terms of pain relief from a nerve standpoint.     ROS Otherwise per HPI.  Assessment & Plan: Visit Diagnoses:    ICD-10-CM   1. Complex regional pain syndrome type 1 of right lower extremity  G90.521 XR C-ARM NO REPORT    Nerve Block    bupivacaine (MARCAINE) 0.25 % (with pres) injection 2 mL      Plan: No additional findings.   Meds & Orders:  Meds ordered this encounter  Medications   bupivacaine (MARCAINE) 0.25 % (with pres) injection 2 mL    Orders Placed This Encounter  Procedures   Nerve Block   XR C-ARM NO REPORT    Follow-up: Return if symptoms worsen or fail to improve.   Procedures: No procedures performed  Lumbar Sympathetic Block with Fluoroscopic Guidance  Patient: Kayla Chaney      Date of Birth: 1975/10/03 MRN: [id number] PCP: Billie Lade, MD      Visit Date: 01/03/2023   Universal Protocol:    Date/Time: 05/07/241:39 PM  Consent Given By: the patient  Position: PRONE  Additional Comments: Vital signs were monitored before and after the procedure. Patient was prepped and draped in the usual sterile fashion. The correct patient, procedure, and site was verified.   Injection Procedure Details:  Procedure Site One Meds  Administered:  Meds ordered this encounter  Medications   bupivacaine (MARCAINE) 0.25 % (with pres) injection 2 mL     Laterality: Right  Location/Site:  L3 vertebral body  Needle size: 25 G  Needle type: spinal  Needle Placement: Lateral vertebral body  Findings:  -Comments: There was excellent flow contrast just ventral to the vertebral body with no vascular flow and no muscular contrast pattern.  There was good spread of contrast up and down to levels.  Procedure Details: The fluoroscope beam was manipulated to square off the endplates of the L3 vertebral body to achieve a  true midline AP view.  An oblique view of the region overlying the superior  portion of the L3 vertebral body with the ipsilateral tip of the transverse process aligned with the ventral edge of the vertebral body was obtained under fluoroscopic visualization. The target is the outer edge of the upper third of the L3 vertebral body.  For the target described the skin was anesthetized with 1 ml of 1% Lidocaine without epinephrine. The spinal needle was inserted down to the inferior anterolateral aspect of the L3 vertebral body using biplanar imaging.   A 4ml volume of Omnipaque-240 was injected to make sure that the needle tip was not in a blood vessel or intra muscular and a standard fascial plane outline was obtained. Bi-planar imaging confirmed placement and appropriate images documented.  The solution noted above was injected in this region slowly in 1 ml aliquots.    Additional Comments:  The patient tolerated the procedure well.  She did have somewhat of a small vasovagal episode at the end of the procedure. Dressing: Band-Aid    Post-procedure details: Patient was observed during the procedure. Post-procedure instructions were reviewed.  Patient left the clinic in stable condition.    Clinical History: No specialty comments available.     Objective:  VS:  HT:    WT:   BMI:     BP:136/85  HR:77bpm  TEMP: ( )  RESP:  Physical Exam Vitals and nursing note reviewed.  Constitutional:      General: She is not in acute distress.    Appearance: Normal appearance. She is not ill-appearing.  HENT:     Head: Normocephalic and atraumatic.     Right Ear: External ear normal.     Left Ear: External ear normal.  Eyes:     Extraocular Movements: Extraocular movements intact.  Cardiovascular:     Rate and Rhythm: Normal rate.     Pulses: Normal pulses.  Pulmonary:     Effort: Pulmonary effort is normal. No respiratory distress.  Abdominal:     General: There is no distension.      Palpations: Abdomen is soft.  Musculoskeletal:        General: Tenderness present.     Cervical back: Neck supple.     Right lower leg: Edema present.     Left lower leg: No edema.     Comments: Patient has good distal strength with no pain over the greater trochanters.  No clonus or focal weakness.  She does have dysesthesias and more of a peroneal or fibular nerve distribution on the right with some allodynia over the ankle and foot.  No real focal swelling but mild swelling.  Skin:    Findings: No erythema, lesion or rash.  Neurological:     General: No focal deficit present.     Mental Status: She is alert and oriented to person, place, and time.  Sensory: No sensory deficit.     Motor: No weakness or abnormal muscle tone.     Coordination: Coordination normal.  Psychiatric:        Mood and Affect: Mood normal.        Behavior: Behavior normal.      Imaging: No results found.

## 2023-01-11 ENCOUNTER — Encounter: Payer: Self-pay | Admitting: Orthopedic Surgery

## 2023-01-11 ENCOUNTER — Ambulatory Visit (INDEPENDENT_AMBULATORY_CARE_PROVIDER_SITE_OTHER): Payer: 59 | Admitting: Internal Medicine

## 2023-01-11 ENCOUNTER — Other Ambulatory Visit (HOSPITAL_COMMUNITY): Payer: Self-pay

## 2023-01-11 ENCOUNTER — Encounter: Payer: Self-pay | Admitting: Internal Medicine

## 2023-01-11 ENCOUNTER — Other Ambulatory Visit: Payer: Self-pay

## 2023-01-11 ENCOUNTER — Other Ambulatory Visit: Payer: Self-pay | Admitting: Orthopedic Surgery

## 2023-01-11 VITALS — BP 126/84 | HR 77 | Ht 65.5 in | Wt 175.6 lb

## 2023-01-11 DIAGNOSIS — E782 Mixed hyperlipidemia: Secondary | ICD-10-CM

## 2023-01-11 MED ORDER — AMITRIPTYLINE HCL 25 MG PO TABS
50.0000 mg | ORAL_TABLET | Freq: Every day | ORAL | 3 refills | Status: DC
  Filled 2023-01-11: qty 60, 30d supply, fill #0

## 2023-01-11 MED ORDER — NABUMETONE 750 MG PO TABS
750.0000 mg | ORAL_TABLET | Freq: Two times a day (BID) | ORAL | 3 refills | Status: DC | PRN
  Filled 2023-01-11: qty 60, 30d supply, fill #0
  Filled 2023-02-19: qty 60, 30d supply, fill #1
  Filled 2023-03-21: qty 60, 30d supply, fill #2
  Filled 2023-06-28: qty 60, 30d supply, fill #3

## 2023-01-11 MED ORDER — METHOCARBAMOL 500 MG PO TABS
500.0000 mg | ORAL_TABLET | Freq: Three times a day (TID) | ORAL | 3 refills | Status: DC | PRN
Start: 2023-01-11 — End: 2023-06-13
  Filled 2023-01-11: qty 90, 30d supply, fill #0
  Filled 2023-02-19: qty 90, 30d supply, fill #1
  Filled 2023-03-21: qty 90, 30d supply, fill #2
  Filled 2023-05-08: qty 90, 30d supply, fill #3

## 2023-01-11 NOTE — Patient Instructions (Signed)
It was a pleasure to see you today.  Thank you for giving [place] the opportunity to be involved in your care.  Below is a brief recap of your visit and next steps.  We will plan to see you again in 6 months  Summary No medication changes today Repeat lipid panel and check labs for familial hypercholesterolemia Follow up in 6 months for annual exam

## 2023-01-11 NOTE — Assessment & Plan Note (Signed)
Lipid panel updated in December 2023.  Total cholesterol 332 and LDL 225.  Previously intolerant of multiple statins.  Repatha was prescribed and it initially started in late January.  She has not experienced any adverse side effects since starting Repatha. -Repeat lipid panel ordered today -Concern for underlying familial hypercholesterolemia given degree of LDL elevation.  She reports a history of cardiovascular disease in both parents.  Father died of an MI at age 83.  Her mother had a stroke at age 72.  No personal history of CAD/CVA.  She does report that her ophthalmologist told her that her cholesterol is high when she is seen.  No xanthomas are appreciated on exam. -Familial hypercholesterolemia panel ordered today

## 2023-01-11 NOTE — Progress Notes (Signed)
Established Patient Office Visit  Subjective   Patient ID: Kayla Chaney, female    DOB: 03/24/1976  Age: 47 y.o. MRN: [id number]  Chief Complaint  Patient presents with   Hyperlipidemia    Follow up   Kayla Chaney returns to care today for routine follow-up.  Last evaluated by me on 11/15 for her annual exam.  In the interim she was started on Repatha as her LDL is significantly elevated.  She has undergone an additional surgical procedure on her right ankle and has also establish care with physiatry, where she has perceived lumbar sympathetic nerve block injections under fluoroscopic guidance for suspected complex regional pain syndrome of the right lower extremity.  She has also been seen by OB/GYN for follow-up.  Kayla Chaney reports feeling well today.  She states that her right ankle pain is improving since her injection on 5/7.  She will receive an additional injection later this month.  She has no additional concerns to discuss today but is eager to repeat her cholesterol panel to see how it has improved since starting Repatha.  She has not experiencing adverse side effects since starting Repatha.  Past Medical History:  Diagnosis Date   Complication of anesthesia    COVID 08/2020   mild case   Hot flashes due to menopause 07/09/2021   Hyperlipidemia    IUD (intrauterine device) in place 07/09/2021   PONV (postoperative nausea and vomiting)    just nausea   Past Surgical History:  Procedure Laterality Date   ANKLE ARTHROSCOPY Right 07/20/2022   Procedure: RIGHT ANKLE ARTHROSCOPY;  Surgeon: Nadara Mustard, MD;  Location: [facility] OR;  Service: Orthopedics;  Laterality: Right;   CESAREAN SECTION  2003, 2008   COLONOSCOPY WITH PROPOFOL N/A 11/20/2020   Procedure: COLONOSCOPY WITH PROPOFOL;  Surgeon: Lanelle Bal, DO;  Location: [facility];  Service: Endoscopy;  Laterality: N/A;  am/ASA II, pt tested +1/24 thru [employer] <90 - results emailed to [place] - advised pt current  arrival time of 7:00 - needed to know for transportation cy 3/21   DILITATION & CURRETTAGE/HYSTROSCOPY WITH ESSURE N/A 2008   Retained placenta from c-section birth   FOOT ARTHRODESIS Right 10/27/2021   Procedure: RIGHT SUBTALAR AND TALONAVICULAR FUSION;  Surgeon: Nadara Mustard, MD;  Location: [facility] OR;  Service: Orthopedics;  Laterality: Right;   HARDWARE REMOVAL Right 07/20/2022   Procedure: REMOVAL SCREW RIGHT ANKLE;  Surgeon: Nadara Mustard, MD;  Location: [facility] OR;  Service: Orthopedics;  Laterality: Right;   POLYPECTOMY  11/20/2020   Procedure: POLYPECTOMY;  Surgeon: Lanelle Bal, DO;  Location: [facility];  Service: Endoscopy;;   Social History   Tobacco Use   Smoking status: Never   Smokeless tobacco: Never  Vaping Use   Vaping Use: Never used  Substance Use Topics   Alcohol use: Yes    Comment: occ   Drug use: No   Family History  Problem Relation Age of Onset   Stroke Mother    Hypertension Father    Diabetes Father    Brain cancer Sister    Stroke Maternal Grandmother    No Known Allergies  Review of Systems  Constitutional:  Negative for chills and fever.  HENT:  Negative for sore throat.   Respiratory:  Negative for cough and shortness of breath.   Cardiovascular:  Negative for chest pain, palpitations and leg swelling.  Gastrointestinal:  Negative for abdominal pain, blood in stool, constipation, diarrhea, nausea  and vomiting.  Genitourinary:  Negative for dysuria and hematuria.  Musculoskeletal:  Negative for myalgias.  Skin:  Negative for itching and rash.  Neurological:  Negative for dizziness and headaches.  Psychiatric/Behavioral:  Negative for depression and suicidal ideas.       Objective:     BP 126/84   Pulse 77   Ht 5' 5.5" (1.664 m)   Wt 175 lb 9.6 oz (79.7 kg)   SpO2 97%   BMI 28.78 kg/m  BP Readings from Last 3 Encounters:  01/11/23 126/84  01/03/23 136/85  08/24/22 114/66   Physical Exam Vitals reviewed.  Constitutional:       General: She is not in acute distress.    Appearance: Normal appearance. She is not toxic-appearing.  HENT:     Head: Normocephalic and atraumatic.     Right Ear: External ear normal.     Left Ear: External ear normal.     Nose: Nose normal. No congestion or rhinorrhea.     Mouth/Throat:     Mouth: Mucous membranes are moist.     Pharynx: Oropharynx is clear. No oropharyngeal exudate or posterior oropharyngeal erythema.  Eyes:     General: No scleral icterus.    Extraocular Movements: Extraocular movements intact.     Conjunctiva/sclera: Conjunctivae normal.     Pupils: Pupils are equal, round, and reactive to light.  Cardiovascular:     Rate and Rhythm: Normal rate and regular rhythm.     Pulses: Normal pulses.     Heart sounds: Normal heart sounds. No murmur heard.    No friction rub. No gallop.  Pulmonary:     Effort: Pulmonary effort is normal.     Breath sounds: Normal breath sounds. No wheezing, rhonchi or rales.  Abdominal:     General: Abdomen is flat. Bowel sounds are normal. There is no distension.     Palpations: Abdomen is soft.     Tenderness: There is no abdominal tenderness.  Musculoskeletal:        General: No swelling. Normal range of motion.     Cervical back: Normal range of motion.     Right lower leg: No edema.     Left lower leg: No edema.  Lymphadenopathy:     Cervical: No cervical adenopathy.  Skin:    General: Skin is warm and dry.     Capillary Refill: Capillary refill takes less than 2 seconds.     Coloration: Skin is not jaundiced.  Neurological:     General: No focal deficit present.     Mental Status: She is alert and oriented to person, place, and time.  Psychiatric:        Mood and Affect: Mood normal.        Behavior: Behavior normal.   Last CBC Lab Results  Component Value Date   WBC 7.9 08/26/2022   HGB 13.4 08/26/2022   HCT 39.2 08/26/2022   MCV 92 08/26/2022   MCH 31.3 08/26/2022   RDW 12.3 08/26/2022   PLT 274 08/26/2022    Last metabolic panel Lab Results  Component Value Date   GLUCOSE 81 08/26/2022   NA 138 08/26/2022   K 4.1 08/26/2022   CL 99 08/26/2022   CO2 22 08/26/2022   BUN 14 08/26/2022   CREATININE 0.63 08/26/2022   EGFR 110 08/26/2022   CALCIUM 9.6 08/26/2022   PROT 7.1 08/26/2022   ALBUMIN 4.5 08/26/2022   LABGLOB 2.6 08/26/2022   AGRATIO 1.7 08/26/2022  BILITOT 0.3 08/26/2022   ALKPHOS 72 08/26/2022   AST 16 08/26/2022   ALT 15 08/26/2022   Last lipids Lab Results  Component Value Date   CHOL 332 (H) 08/26/2022   HDL 55 08/26/2022   LDLCALC 225 (H) 08/26/2022   TRIG 257 (H) 08/26/2022   CHOLHDL 6.0 (H) 08/26/2022   Last hemoglobin A1c Lab Results  Component Value Date   HGBA1C 5.3 08/26/2022   Last thyroid functions Lab Results  Component Value Date   TSH 1.980 08/26/2022   Last vitamin D Lab Results  Component Value Date   VD25OH 36.8 08/26/2022   Last vitamin B12 and Folate Lab Results  Component Value Date   VITAMINB12 385 08/26/2022   FOLATE >20.0 08/26/2022     Assessment & Plan:   Problem List Items Addressed This Visit       Hyperlipemia - Primary    Lipid panel updated in December 2023.  Total cholesterol 332 and LDL 225.  Previously intolerant of multiple statins.  Repatha was prescribed and it initially started in late January.  She has not experienced any adverse side effects since starting Repatha. -Repeat lipid panel ordered today -Concern for underlying familial hypercholesterolemia given degree of LDL elevation.  She reports a history of cardiovascular disease in both parents.  Father died of an MI at age 51.  Her mother had a stroke at age 79.  No personal history of CAD/CVA.  She does report that her ophthalmologist told her that her cholesterol is high when she is seen.  No xanthomas are appreciated on exam. -Familial hypercholesterolemia panel ordered today      Relevant Orders   Familial Hypercholesterolemia   Lipid Profile     Return in about 6 months (around 07/14/2023) for CPE.    Billie Lade, MD

## 2023-01-17 ENCOUNTER — Encounter: Payer: Self-pay | Admitting: Orthopedic Surgery

## 2023-01-18 ENCOUNTER — Other Ambulatory Visit (HOSPITAL_COMMUNITY): Payer: Self-pay

## 2023-01-18 ENCOUNTER — Other Ambulatory Visit: Payer: Self-pay | Admitting: Orthopedic Surgery

## 2023-01-18 MED ORDER — AMITRIPTYLINE HCL 25 MG PO TABS
75.0000 mg | ORAL_TABLET | Freq: Every day | ORAL | 3 refills | Status: DC
  Filled 2023-01-18 – 2023-01-24 (×2): qty 90, 30d supply, fill #0
  Filled 2023-02-19: qty 90, 30d supply, fill #1
  Filled 2023-03-21: qty 90, 30d supply, fill #2
  Filled 2023-04-19: qty 90, 30d supply, fill #3

## 2023-01-24 ENCOUNTER — Other Ambulatory Visit: Payer: Self-pay

## 2023-01-25 ENCOUNTER — Ambulatory Visit: Payer: 59 | Admitting: Physical Medicine and Rehabilitation

## 2023-01-25 ENCOUNTER — Other Ambulatory Visit: Payer: Self-pay

## 2023-01-25 ENCOUNTER — Other Ambulatory Visit (HOSPITAL_COMMUNITY): Payer: Self-pay

## 2023-01-25 VITALS — BP 127/80 | HR 64

## 2023-01-25 DIAGNOSIS — M25571 Pain in right ankle and joints of right foot: Secondary | ICD-10-CM

## 2023-01-25 DIAGNOSIS — G90521 Complex regional pain syndrome I of right lower limb: Secondary | ICD-10-CM

## 2023-01-25 MED ORDER — BUPIVACAINE HCL 0.25 % IJ SOLN
5.0000 mL | Freq: Once | INTRAMUSCULAR | Status: AC
Start: 2023-01-25 — End: 2023-01-25
  Administered 2023-01-25: 5 mL

## 2023-01-25 NOTE — Patient Instructions (Signed)

## 2023-01-25 NOTE — Progress Notes (Unsigned)
Functional Pain Scale - descriptive words and definitions  Uncomfortable (3)  Pain is present but can complete all ADL's/sleep is slightly affected and passive distraction only gives marginal relief. Mild range order  Average Pain 1   +Driver, -BT, -Dye Allergies.  Right foot pain. Pain worse in the mornings

## 2023-01-26 NOTE — Progress Notes (Signed)
Kayla Chaney - 47 y.o. Kayla MRN [id number]  Date of birth: Oct 12, 1975  Office Visit Note: Visit Date: 01/25/2023 PCP: Billie Lade, MD Referred by: Billie Lade, MD  Subjective: Chief Complaint  Patient presents with   Lower Back - Pain   HPI:  Kayla Chaney is a 47 y.o. Kayla who comes in today at the request of Dr. Aldean Baker for planned second Right lumbar sympathetic block injection with fluoroscopic guidance.  The patient has failed conservative care including home exercise, medications, time and activity modification.  Did receive some relif and could sleep better after first injection.  Please see requesting physician notes for further details and justification.   ROS Otherwise per HPI.  Assessment & Plan: Visit Diagnoses:    ICD-10-CM   1. Complex regional pain syndrome type 1 of right lower extremity  G90.521 XR C-ARM NO REPORT    Nerve Block    bupivacaine (MARCAINE) 0.25 % (with pres) injection 5 mL    2. Pain in right ankle and joints of right foot  M25.571 XR C-ARM NO REPORT    Nerve Block    bupivacaine (MARCAINE) 0.25 % (with pres) injection 5 mL      Plan: No additional findings.   Meds & Orders:  Meds ordered this encounter  Medications   bupivacaine (MARCAINE) 0.25 % (with pres) injection 5 mL    Orders Placed This Encounter  Procedures   Nerve Block   XR C-ARM NO REPORT    Follow-up: Return if symptoms worsen or fail to improve.   Procedures: No procedures performed  Lumbar Sympathetic Block with Fluoroscopic Guidance  Patient: Kayla Chaney      Date of Birth: 10/06/1975 MRN: [id number] PCP: Billie Lade, MD      Visit Date: 01/25/2023   Universal Protocol:    Date/Time: 05/30/245:27 AM  Consent Given By: the patient  Position: PRONE  Additional Comments: Vital signs were monitored before and after the procedure. Patient was prepped and draped in the usual sterile fashion. The correct patient, procedure, and site  was verified.   Injection Procedure Details:  Procedure Site One Meds Administered:  Meds ordered this encounter  Medications   bupivacaine (MARCAINE) 0.25 % (with pres) injection 5 mL     Laterality: Right  Location/Site:  L3 vertebral body  Needle size: 22 G  Needle type: spinal  Needle Placement: Lateral vertebral body  Findings:  -Comments: Flow of contrast showed no intravascular or intramuscular flow.  Procedure Details: The fluoroscope beam was manipulated to square off the endplates of the L3 vertebral body to achieve a true midline AP view.  An oblique view of the region overlying the superior  portion of the L3 vertebral body with the ipsilateral tip of the transverse process aligned with the ventral edge of the vertebral body was obtained under fluoroscopic visualization. The target is the outer edge of the upper third of the L3 vertebral body.  For the target described the skin was anesthetized with 1 ml of 1% Lidocaine without epinephrine. The spinal needle was inserted down to the inferior anterolateral aspect of the L3 vertebral body using biplanar imaging.   A 4ml volume of Omnipaque-240 was injected to make sure that the needle tip was not in a blood vessel or intra muscular and a standard fascial plane outline was obtained. Bi-planar imaging confirmed placement and appropriate images documented.  The solution noted above was injected in this region slowly in 1  ml aliquots.    Additional Comments:  The patient tolerated the procedure well Dressing: Band-Aid    Post-procedure details: Patient was observed during the procedure. Post-procedure instructions were reviewed.  Patient left the clinic in stable condition.    Clinical History: No specialty comments available.     Objective:  VS:  HT:    WT:   BMI:     BP:127/80  HR:64bpm  TEMP: ( )  RESP:  Physical Exam Vitals and nursing note reviewed.  Constitutional:      General: She is not in  acute distress.    Appearance: Normal appearance. She is not ill-appearing.  HENT:     Head: Normocephalic and atraumatic.     Right Ear: External ear normal.     Left Ear: External ear normal.  Eyes:     Extraocular Movements: Extraocular movements intact.  Cardiovascular:     Rate and Rhythm: Normal rate.     Pulses: Normal pulses.  Pulmonary:     Effort: Pulmonary effort is normal. No respiratory distress.  Abdominal:     General: There is no distension.     Palpations: Abdomen is soft.  Musculoskeletal:        General: Tenderness present.     Cervical back: Neck supple.     Right lower leg: No edema.     Left lower leg: No edema.     Comments: Patient has good distal strength with no pain over the greater trochanters.  No clonus or focal weakness.  Skin:    Findings: No erythema, lesion or rash.  Neurological:     General: No focal deficit present.     Mental Status: She is alert and oriented to person, place, and time.     Sensory: No sensory deficit.     Motor: No weakness or abnormal muscle tone.     Coordination: Coordination normal.  Psychiatric:        Mood and Affect: Mood normal.        Behavior: Behavior normal.      Imaging: No results found.

## 2023-01-26 NOTE — Procedures (Signed)
Lumbar Sympathetic Block with Fluoroscopic Guidance  Patient: Kayla Chaney      Date of Birth: 08-08-1976 MRN: [id number] PCP: Billie Lade, MD      Visit Date: 01/25/2023   Universal Protocol:    Date/Time: 05/30/245:27 AM  Consent Given By: the patient  Position: PRONE  Additional Comments: Vital signs were monitored before and after the procedure. Patient was prepped and draped in the usual sterile fashion. The correct patient, procedure, and site was verified.   Injection Procedure Details:  Procedure Site One Meds Administered:  Meds ordered this encounter  Medications   bupivacaine (MARCAINE) 0.25 % (with pres) injection 5 mL     Laterality: Right  Location/Site:  L3 vertebral body  Needle size: 22 G  Needle type: spinal  Needle Placement: Lateral vertebral body  Findings:  -Comments: Flow of contrast showed no intravascular or intramuscular flow.  Procedure Details: The fluoroscope beam was manipulated to square off the endplates of the L3 vertebral body to achieve a true midline AP view.  An oblique view of the region overlying the superior  portion of the L3 vertebral body with the ipsilateral tip of the transverse process aligned with the ventral edge of the vertebral body was obtained under fluoroscopic visualization. The target is the outer edge of the upper third of the L3 vertebral body.  For the target described the skin was anesthetized with 1 ml of 1% Lidocaine without epinephrine. The spinal needle was inserted down to the inferior anterolateral aspect of the L3 vertebral body using biplanar imaging.   A 4ml volume of Omnipaque-240 was injected to make sure that the needle tip was not in a blood vessel or intra muscular and a standard fascial plane outline was obtained. Bi-planar imaging confirmed placement and appropriate images documented.  The solution noted above was injected in this region slowly in 1 ml aliquots.    Additional Comments:   The patient tolerated the procedure well Dressing: Band-Aid    Post-procedure details: Patient was observed during the procedure. Post-procedure instructions were reviewed.  Patient left the clinic in stable condition.

## 2023-01-27 DIAGNOSIS — E782 Mixed hyperlipidemia: Secondary | ICD-10-CM | POA: Diagnosis not present

## 2023-01-28 LAB — LIPID PANEL
Triglycerides: 239 mg/dL — ABNORMAL HIGH (ref 0–149)
VLDL Cholesterol Cal: 40 mg/dL (ref 5–40)

## 2023-01-28 LAB — FAMILIAL HYPERCHOLESTEROLEMIA

## 2023-02-07 LAB — FAMILIAL HYPERCHOLESTEROLEMIA

## 2023-02-07 LAB — LIPID PANEL
Chol/HDL Ratio: 3.6 ratio (ref 0.0–4.4)
Cholesterol, Total: 175 mg/dL (ref 100–199)
HDL: 49 mg/dL (ref >39)
LDL Chol Calc (NIH): 86 mg/dL (ref 0–99)

## 2023-02-20 ENCOUNTER — Other Ambulatory Visit: Payer: Self-pay

## 2023-02-28 ENCOUNTER — Other Ambulatory Visit: Payer: Self-pay | Admitting: Internal Medicine

## 2023-02-28 DIAGNOSIS — E782 Mixed hyperlipidemia: Secondary | ICD-10-CM

## 2023-02-28 DIAGNOSIS — Z789 Other specified health status: Secondary | ICD-10-CM

## 2023-02-28 MED ORDER — REPATHA 140 MG/ML ~~LOC~~ SOSY
140.0000 mg | PREFILLED_SYRINGE | SUBCUTANEOUS | 0 refills | Status: DC
Start: 2023-02-28 — End: 2023-06-05
  Filled 2023-02-28: qty 6, 84d supply, fill #0

## 2023-03-01 ENCOUNTER — Encounter: Payer: Self-pay | Admitting: Pharmacist

## 2023-03-01 ENCOUNTER — Other Ambulatory Visit: Payer: Self-pay

## 2023-03-01 ENCOUNTER — Other Ambulatory Visit (HOSPITAL_COMMUNITY): Payer: Self-pay

## 2023-03-03 ENCOUNTER — Other Ambulatory Visit (HOSPITAL_COMMUNITY): Payer: Self-pay

## 2023-03-15 ENCOUNTER — Other Ambulatory Visit (HOSPITAL_COMMUNITY): Payer: Self-pay

## 2023-03-17 ENCOUNTER — Other Ambulatory Visit: Payer: Self-pay

## 2023-03-21 ENCOUNTER — Other Ambulatory Visit (HOSPITAL_COMMUNITY): Payer: Self-pay

## 2023-03-24 ENCOUNTER — Encounter: Payer: Self-pay | Admitting: Physical Medicine and Rehabilitation

## 2023-03-24 ENCOUNTER — Encounter: Payer: Self-pay | Admitting: Orthopedic Surgery

## 2023-04-02 LAB — INFORMED CONSENT NEEDED

## 2023-04-10 ENCOUNTER — Encounter: Payer: Self-pay | Admitting: Family Medicine

## 2023-04-13 ENCOUNTER — Other Ambulatory Visit (HOSPITAL_COMMUNITY): Payer: Self-pay

## 2023-04-13 ENCOUNTER — Other Ambulatory Visit: Payer: Self-pay

## 2023-04-13 ENCOUNTER — Other Ambulatory Visit: Payer: Self-pay | Admitting: Orthopedic Surgery

## 2023-04-13 MED ORDER — GABAPENTIN 300 MG PO CAPS
300.0000 mg | ORAL_CAPSULE | Freq: Three times a day (TID) | ORAL | 3 refills | Status: DC
  Filled 2023-04-13: qty 90, 30d supply, fill #0

## 2023-04-14 ENCOUNTER — Other Ambulatory Visit (HOSPITAL_COMMUNITY): Payer: Self-pay

## 2023-04-19 ENCOUNTER — Other Ambulatory Visit: Payer: Self-pay

## 2023-04-25 ENCOUNTER — Encounter: Payer: Self-pay | Admitting: Internal Medicine

## 2023-04-27 ENCOUNTER — Encounter: Payer: Self-pay | Admitting: Family Medicine

## 2023-04-27 ENCOUNTER — Telehealth (INDEPENDENT_AMBULATORY_CARE_PROVIDER_SITE_OTHER): Payer: 59 | Admitting: Family Medicine

## 2023-04-27 VITALS — Ht 65.5 in | Wt 175.0 lb

## 2023-04-27 DIAGNOSIS — R350 Frequency of micturition: Secondary | ICD-10-CM | POA: Diagnosis not present

## 2023-04-27 MED ORDER — SULFAMETHOXAZOLE-TRIMETHOPRIM 800-160 MG PO TABS
1.0000 | ORAL_TABLET | Freq: Two times a day (BID) | ORAL | 0 refills | Status: DC
Start: 2023-04-27 — End: 2023-04-28

## 2023-04-27 NOTE — Progress Notes (Signed)
Virtual Visit via Video Note  I connected with Kayla Chaney on 04/28/23 at 11:40 AM EDT by a video enabled telemedicine application and verified that I am speaking with the correct person using two identifiers.  Patient Location: Other:  work Dispensing optician: Office/Clinic  I discussed the limitations, risks, security, and privacy concerns of performing an evaluation and management service by video and the availability of in person appointments. I also discussed with the patient that there may be a patient responsible charge related to this service. The patient expressed understanding and agreed to proceed.  Subjective: PCP: Billie Lade, MD  Chief Complaint  Patient presents with   Urinary Tract Infection    Pt reports urinary frequency and burning, ongoing since Monday.    HPI The patient is in today with complaints of urinary frequency and burning since Monday, 04/24/2023.  She denies fever, low back pain, suprapubic pain.  ROS: Per HPI  Current Outpatient Medications:    amitriptyline (ELAVIL) 25 MG tablet, Take 3 tablets (75 mg total) by mouth at bedtime., Disp: 90 tablet, Rfl: 3   Calcium Carbonate (CALCIUM 500 PO), Take 500 mg by mouth daily., Disp: , Rfl:    cholecalciferol (VITAMIN D3) 25 MCG (1000 UNIT) tablet, Take 1,000 Units by mouth daily., Disp: , Rfl:    estradiol (ESTRACE) 0.5 MG tablet, Take 1 tablet (0.5 mg total) by mouth daily., Disp: 90 tablet, Rfl: 4   Evolocumab (REPATHA) 140 MG/ML SOSY, Inject 140 mg into the skin every 14 (fourteen) days., Disp: 6 mL, Rfl: 0   gabapentin (NEURONTIN) 300 MG capsule, Take 1 capsule (300 mg total) by mouth 3 (three) times daily when necessary for neuropathy pain, Disp: 90 capsule, Rfl: 3   levonorgestrel (MIRENA) 20 MCG/24HR IUD, 1 each by Intrauterine route once., Disp: , Rfl:    methocarbamol (ROBAXIN) 500 MG tablet, Take 1 tablet (500 mg total) by mouth every 8 (eight) hours as needed for muscle spasms., Disp: 90  tablet, Rfl: 3   nabumetone (RELAFEN) 750 MG tablet, Take 1 tablet (750 mg total) by mouth 2 (two) times daily as needed for mild pain or moderate pain. with food, Disp: 60 tablet, Rfl: 3   sulfamethoxazole-trimethoprim (BACTRIM DS) 800-160 MG tablet, Take 1 tablet by mouth 2 (two) times daily for 5 days., Disp: 10 tablet, Rfl: 0  Observations/Objective: Today's Vitals   04/27/23 1119  Weight: 175 lb (79.4 kg)  Height: 5' 5.5" (1.664 m)  PainSc: 0-No pain   Physical Exam Patient is well-developed, well-nourished in no acute distress.  Resting comfortably at home.  Head is normocephalic, atraumatic.  No labored breathing.  Speech is clear and coherent with logical content.  Patient is alert and oriented at baseline.   Assessment and Plan: Urinary frequency -     Urinalysis -     Sulfamethoxazole-Trimethoprim; Take 1 tablet by mouth 2 (two) times daily for 5 days.  Dispense: 10 tablet; Refill: 0  Please ensure you complete the full course of the antibiotics as prescribed.  To help prevent future urinary tract infections (UTIs), consider the following practices:  Avoid Prolonged Urination: Do not hold urine for extended periods, as this can stretch the bladder and create an environment conducive to bacterial growth. Prompt Bladder Emptying: Empty your bladder as soon as you feel the urge. Post-Intercourse Hygiene: Empty your bladder soon after intercourse to reduce the risk of infection. Shower Instead of Bath: Opt for showers rather than baths to minimize bacterial exposure.  Proper Wiping Technique: Wipe from front to back after urinating and bowel movements to prevent the transfer of bacteria from the anal region to the vagina and urethra. Hydration: Drink a full glass of water regularly to help flush bacteria from your system.  Follow Up Instructions: No follow-ups on file.   I discussed the assessment and treatment plan with the patient. The patient was provided an opportunity  to ask questions, and all were answered. The patient agreed with the plan and demonstrated an understanding of the instructions.   The patient was advised to call back or seek an in-person evaluation if the symptoms worsen or if the condition fails to improve as anticipated.  The above assessment and management plan was discussed with the patient. The patient verbalized understanding of and has agreed to the management plan.   Gilmore Laroche, FNP

## 2023-04-28 ENCOUNTER — Encounter: Payer: Self-pay | Admitting: Family Medicine

## 2023-04-28 ENCOUNTER — Other Ambulatory Visit: Payer: Self-pay | Admitting: Family Medicine

## 2023-04-28 ENCOUNTER — Encounter: Payer: Self-pay | Admitting: Pharmacist

## 2023-04-28 ENCOUNTER — Other Ambulatory Visit (HOSPITAL_COMMUNITY): Payer: Self-pay

## 2023-04-28 ENCOUNTER — Other Ambulatory Visit: Payer: Self-pay

## 2023-04-28 DIAGNOSIS — R350 Frequency of micturition: Secondary | ICD-10-CM

## 2023-04-28 LAB — URINALYSIS
Bilirubin, UA: NEGATIVE
Glucose, UA: NEGATIVE
Ketones, UA: NEGATIVE
Leukocytes,UA: NEGATIVE
Nitrite, UA: NEGATIVE
Specific Gravity, UA: 1.015 (ref 1.005–1.030)
Urobilinogen, Ur: 0.2 mg/dL (ref 0.2–1.0)
pH, UA: 5 (ref 5.0–7.5)

## 2023-04-28 MED ORDER — NITROFURANTOIN MONOHYD MACRO 100 MG PO CAPS
100.0000 mg | ORAL_CAPSULE | Freq: Two times a day (BID) | ORAL | 0 refills | Status: DC
Start: 2023-04-28 — End: 2023-04-28
  Filled 2023-04-28: qty 10, 5d supply, fill #0

## 2023-04-28 MED ORDER — NITROFURANTOIN MONOHYD MACRO 100 MG PO CAPS
100.0000 mg | ORAL_CAPSULE | Freq: Two times a day (BID) | ORAL | 0 refills | Status: AC
Start: 2023-04-28 — End: 2023-05-03

## 2023-05-05 ENCOUNTER — Ambulatory Visit: Payer: 59 | Admitting: Sports Medicine

## 2023-05-05 ENCOUNTER — Encounter: Payer: Self-pay | Admitting: Sports Medicine

## 2023-05-05 DIAGNOSIS — M25571 Pain in right ankle and joints of right foot: Secondary | ICD-10-CM

## 2023-05-05 DIAGNOSIS — Z981 Arthrodesis status: Secondary | ICD-10-CM

## 2023-05-05 DIAGNOSIS — M7661 Achilles tendinitis, right leg: Secondary | ICD-10-CM

## 2023-05-05 NOTE — Progress Notes (Signed)
Kayla Chaney - 47 y.o. female MRN [id number]  Date of birth: 08/14/1976  Office Visit Note: Visit Date: 05/05/2023 PCP: Billie Lade, MD Referred by: Billie Lade, MD  Subjective: Chief Complaint  Patient presents with   Right Ankle - Pain   HPI: Kayla Chaney is a pleasant 47 y.o. female who presents today for chronic right ankle pain with achilles tendinopathy in setting of s/p subtalar and talonavicular fusion as well as ankle arthroscopy with removal of screw.  Performed previously by Dr. Lajoyce Corners.  Initially thought she may be dealing with some CRPS, good activity lumbar sympathetic injections without lasting relief.  Referred here for possible shockwave therapy and further evaluation.  Her pain is over the lateral aspect of the Achilles tendon and the soft tissue between the Achilles tendon and lateral malleolus.  There is some residual scar tissue and pain from her previous surgical screw.  She is staying very active walking, taking Zumba classes and other physical activity but still has some pain and soreness following these events.  Pertinent ROS were reviewed with the patient and found to be negative unless otherwise specified above in HPI.   Assessment & Plan: Visit Diagnoses:  1. Pain in right ankle and joints of right foot   2. Noninsertional tendinopathy of right Achilles tendon   3. History of fusion of talotibial joint and subtalar joint    Plan: Discussed with pain treatment options for her residual right ankle pain status post fusion of her subtalar and talonavicular joint.  I do believe he is dealing with some noninsertional Achilles tendinopathy as well as some myofascial restriction about the posterior lateral ankle.  Through shared decision-making, did proceed with a trial of extracorporeal shockwave therapy, patient tolerated well.  I would like her to follow-up in 1 week for repeat treatment and after 2 sessions we will see what degree of improvement she has  received before discussing additional treatments.  She will continue her routine physical activity, her stretching and home rehab exercises as tolerated.  She will follow-up in 1 week.  Follow-up: Return in about 1 week (around 05/12/2023) for for R-ankle (reg visit).   Meds & Orders: No orders of the defined types were placed in this encounter.  No orders of the defined types were placed in this encounter.    Procedures: Procedure: ECSWT Indications: Achilles tendinopathy, soft tissue restriction status post subtalar fusion   Procedure Details Consent: Risks of procedure as well as the alternatives and risks of each were explained to the patient.  Verbal consent for procedure obtained. Time Out: Verified patient identification, verified procedure, site was marked, verified correct patient position. The area was cleaned with alcohol swab.     The right Achilles tendon to be associated myofascia (peroneal region) was targeted for Extracorporeal shockwave therapy.    Preset: Achillodynia Power Level: 100-110 mJ Frequency: 11 Hz Impulse/cycles: 2000 Head size: Regular   The right lateral ankle scar tissue was targeted for Extracorporeal shockwave therapy.   Power Level: 60-70 mJ Frequency: 8 Hz Impulse/cycles: 800 Head size: Regular  Patient tolerated procedure well without immediate complications.         Clinical History: No specialty comments available.  She reports that she has never smoked. She has never used smokeless tobacco.  Recent Labs    08/26/22 1232  HGBA1C 5.3    Objective:    Physical Exam  Gen: Well-appearing, in no acute distress; non-toxic CV:  Well-perfused. Warm.  Resp:  Breathing unlabored on room air; no wheezing. Psych: Fluid speech in conversation; appropriate affect; normal thought process Neuro: Sensation intact throughout. No gross coordination deficits.   Ortho Exam - Right ankle/foot: There is a mild TTP over the lateral aspect of the  Achilles and associated fascia just lateral to the base and overlying the peroneal region.  No bony TTP.  There is a well-healed incision from prior subtalar and talonavicular fusion.  Mild scar tissue palpated over the lateral aspect of the ankle.  There is excellent range of motion in dorsiflexion and plantarflexion.  No ankle effusion.  Imaging: No results found.  Past Medical/Family/Surgical/Social History: Medications & Allergies reviewed per EMR, new medications updated. Patient Active Problem List   Diagnosis Date Noted   Impingement syndrome of right ankle 07/20/2022   Pain from implanted hardware 07/20/2022   Encounter for annual general medical examination with abnormal findings in adult 07/13/2022   Posterior tibialis tendon insufficiency    Otitis externa, left 07/25/2021   Acute otalgia, left 07/23/2021   Palpitations 07/09/2021   Hot flashes due to menopause 07/09/2021   IUD (intrauterine device) in place 07/09/2021   Hyperlipemia 07/09/2021   Insomnia 11/13/2013   Degenerative joint disease of cervical spine 11/13/2013   Past Medical History:  Diagnosis Date   Complication of anesthesia    COVID 08/2020   mild case   Hot flashes due to menopause 07/09/2021   Hyperlipidemia    IUD (intrauterine device) in place 07/09/2021   PONV (postoperative nausea and vomiting)    just nausea   Family History  Problem Relation Age of Onset   Stroke Mother    Hypertension Father    Diabetes Father    Brain cancer Sister    Stroke Maternal Grandmother    Past Surgical History:  Procedure Laterality Date   ANKLE ARTHROSCOPY Right 07/20/2022   Procedure: RIGHT ANKLE ARTHROSCOPY;  Surgeon: Nadara Mustard, MD;  Location: [facility] OR;  Service: Orthopedics;  Laterality: Right;   CESAREAN SECTION  2003, 2008   COLONOSCOPY WITH PROPOFOL N/A 11/20/2020   Procedure: COLONOSCOPY WITH PROPOFOL;  Surgeon: Lanelle Bal, DO;  Location: [facility];  Service: Endoscopy;  Laterality:  N/A;  am/ASA II, pt tested +1/24 thru [employer] [facility] <90 - results emailed to [place] - advised pt current arrival time of 7:00 - needed to know for transportation cy 3/21   DILITATION & CURRETTAGE/HYSTROSCOPY WITH ESSURE N/A 2008   Retained placenta from c-section birth   FOOT ARTHRODESIS Right 10/27/2021   Procedure: RIGHT SUBTALAR AND TALONAVICULAR FUSION;  Surgeon: Nadara Mustard, MD;  Location: [facility] OR;  Service: Orthopedics;  Laterality: Right;   HARDWARE REMOVAL Right 07/20/2022   Procedure: REMOVAL SCREW RIGHT ANKLE;  Surgeon: Nadara Mustard, MD;  Location: [facility] OR;  Service: Orthopedics;  Laterality: Right;   POLYPECTOMY  11/20/2020   Procedure: POLYPECTOMY;  Surgeon: Lanelle Bal, DO;  Location: [facility];  Service: Endoscopy;;   Social History   Occupational History    Comment: XRAY tech  Tobacco Use   Smoking status: Never   Smokeless tobacco: Never  Vaping Use   Vaping status: Never Used  Substance and Sexual Activity   Alcohol use: Yes    Comment: occ   Drug use: No   Sexual activity: Yes    Birth control/protection: I.U.D.

## 2023-05-05 NOTE — Progress Notes (Signed)
Patient states that she is feeling okay today. She says that after having her foot fused she has pain in her right heel as well as the lateral aspect of her right ankle up her lower leg. Patient has been seeing Dr. Lajoyce Corners who recommended shockwave therapy.

## 2023-05-08 ENCOUNTER — Encounter: Payer: Self-pay | Admitting: Sports Medicine

## 2023-05-08 ENCOUNTER — Other Ambulatory Visit (HOSPITAL_COMMUNITY): Payer: Self-pay

## 2023-05-08 ENCOUNTER — Other Ambulatory Visit: Payer: Self-pay

## 2023-05-10 ENCOUNTER — Other Ambulatory Visit (HOSPITAL_COMMUNITY): Payer: Self-pay

## 2023-05-10 MED ORDER — VALACYCLOVIR HCL 1 G PO TABS
2.0000 g | ORAL_TABLET | ORAL | 1 refills | Status: AC
  Filled 2023-05-10: qty 40, 10d supply, fill #0
  Filled 2023-08-11: qty 40, 10d supply, fill #1

## 2023-05-12 ENCOUNTER — Encounter: Payer: Self-pay | Admitting: Sports Medicine

## 2023-05-12 ENCOUNTER — Ambulatory Visit: Payer: 59 | Admitting: Sports Medicine

## 2023-05-12 DIAGNOSIS — M7661 Achilles tendinitis, right leg: Secondary | ICD-10-CM

## 2023-05-12 DIAGNOSIS — Z981 Arthrodesis status: Secondary | ICD-10-CM

## 2023-05-12 DIAGNOSIS — M25571 Pain in right ankle and joints of right foot: Secondary | ICD-10-CM | POA: Diagnosis not present

## 2023-05-12 NOTE — Progress Notes (Signed)
Kayla Chaney - 47 y.o. female MRN [id number]  Date of birth: [birth year]  Office Visit Note: Visit Date: 05/12/2023 PCP: Billie Lade, MD Referred by: Billie Lade, MD  Subjective: Chief Complaint  Patient presents with   Right Ankle - Follow-up, Pain   HPI: Kayla Chaney is a pleasant 47 y.o. female who presents today for f/u of chronic right ankle pain and achilles tendinopathy.  S/p subtalar and talonavicular fusion as well as ankle arthroscopy with removal of screw.   Has had 1 session of extracorporeal shockwave therapy, states she feels about the same in terms of pain, but does notice she has been able to take stairs easier, feels slightly more bounce in her step.  She is still getting around with activity and active with Zumba, Yoga.  Because we were doing shockwave, she has reduced her activity from once to twice daily to only as needed, only has taken twice this past week.  Pertinent ROS were reviewed with the patient and found to be negative unless otherwise specified above in HPI.   Assessment & Plan: Visit Diagnoses:  1. Pain in right ankle and joints of right foot   2. Noninsertional tendinopathy of right Achilles tendon   3. History of fusion of talotibial joint and subtalar joint    Plan: Discussed with Kayla Chaney the nature of her residual right ankle pain, she did notice some improvement with stairs and slightly more balance in her step.  Her pain is about the same, but I am wondering if this is because she has reduced her nabumetone which was daily to only as needed.  Through shared decision making did repeat with a second trial of extracorporeal shockwave therapy.  We will see if she is noticing a degree of improvement over the next week or 2, if she feels like she is at least 15-20% improved, she will represent for additional treatments.  I did place a 3/16" inch heel lift into the right shoe today to help with transferring with plantarflexion and her very mild leg  length discrepancy.  She will follow-up with me as needed.  Follow-up: Return if symptoms worsen or fail to improve.   Meds & Orders: No orders of the defined types were placed in this encounter.  No orders of the defined types were placed in this encounter.    Procedures: Procedure: ECSWT Indications: Achilles tendinopathy, soft tissue restriction status post subtalar fusion   Procedure Details Consent: Risks of procedure as well as the alternatives and risks of each were explained to the patient.  Verbal consent for procedure obtained. Time Out: Verified patient identification, verified procedure, site was marked, verified correct patient position. The area was cleaned with alcohol swab.     The right Achilles and peroneals tendon to be associated myofascia (peroneal region) was targeted for Extracorporeal shockwave therapy.    Preset: Achillodynia Power Level: 110 mJ Frequency: 11 Hz Impulse/cycles: 2250 Head size: Regular   The right lateral ankle scar tissue was targeted for Extracorporeal shockwave therapy.    Power Level: 70 mJ Frequency: 8 Hz Impulse/cycles: 750 Head size: Regular   Patient tolerated procedure well without immediate complications.      Clinical History: No specialty comments available.  She reports that she has never smoked. She has never used smokeless tobacco.  Recent Labs    08/26/22 1232  HGBA1C 5.3    Objective:    Physical Exam  Gen: Well-appearing, in no acute distress; non-toxic CV: Well-perfused.  Warm.  Resp: Breathing unlabored on room air; no wheezing. Psych: Fluid speech in conversation; appropriate affect; normal thought process Neuro: Sensation intact throughout. No gross coordination deficits.   Ortho Exam - Right ankle: Very mild TTP over the lateral aspect of the Achilles and the peroneal tendons about at the level of the lateral ankle mortise.  No bony TTP or swelling.  There is a well-healed incision from prior subtalar  and talonavicular fusion.  There is good range of motion with dorsiflexion and plantarflexion.  - Gait analysis: With bilateral heel strike, she does have less of a pushoff on the right foot as she states more in the heel and midfoot with swing phase.   Imaging: No results found.  Past Medical/Family/Surgical/Social History: Medications & Allergies reviewed per EMR, new medications updated. Patient Active Problem List   Diagnosis Date Noted   Impingement syndrome of right ankle 07/20/2022   Pain from implanted hardware 07/20/2022   Encounter for annual general medical examination with abnormal findings in adult 07/13/2022   Posterior tibialis tendon insufficiency    Otitis externa, left 07/25/2021   Acute otalgia, left 07/23/2021   Palpitations 07/09/2021   Hot flashes due to menopause 07/09/2021   IUD (intrauterine device) in place 07/09/2021   Hyperlipemia 07/09/2021   Insomnia 11/13/2013   Degenerative joint disease of cervical spine 11/13/2013   Past Medical History:  Diagnosis Date   Complication of anesthesia    COVID 08/2020   mild case   Hot flashes due to menopause 07/09/2021   Hyperlipidemia    IUD (intrauterine device) in place 07/09/2021   PONV (postoperative nausea and vomiting)    just nausea   Family History  Problem Relation Age of Onset   Stroke Mother    Hypertension Father    Diabetes Father    Brain cancer Sister    Stroke Maternal Grandmother    Past Surgical History:  Procedure Laterality Date   ANKLE ARTHROSCOPY Right 07/20/2022   Procedure: RIGHT ANKLE ARTHROSCOPY;  Surgeon: Nadara Mustard, MD;  Location: [facility] OR;  Service: Orthopedics;  Laterality: Right;   CESAREAN SECTION  2003, 2008   COLONOSCOPY WITH PROPOFOL N/A 11/20/2020   Procedure: COLONOSCOPY WITH PROPOFOL;  Surgeon: Lanelle Bal, DO;  Location: [facility];  Service: Endoscopy;  Laterality: N/A;  am/ASA II, pt tested +1/24 thru [employer] [facility] <90 - results emailed to [place] -  advised pt current arrival time of 7:00 - needed to know for transportation cy 3/21   DILITATION & CURRETTAGE/HYSTROSCOPY WITH ESSURE N/A 2008   Retained placenta from c-section birth   FOOT ARTHRODESIS Right 10/27/2021   Procedure: RIGHT SUBTALAR AND TALONAVICULAR FUSION;  Surgeon: Nadara Mustard, MD;  Location: [facility] OR;  Service: Orthopedics;  Laterality: Right;   HARDWARE REMOVAL Right 07/20/2022   Procedure: REMOVAL SCREW RIGHT ANKLE;  Surgeon: Nadara Mustard, MD;  Location: [facility] OR;  Service: Orthopedics;  Laterality: Right;   POLYPECTOMY  11/20/2020   Procedure: POLYPECTOMY;  Surgeon: Lanelle Bal, DO;  Location: [facility];  Service: Endoscopy;;   Social History   Occupational History    Comment: XRAY tech  Tobacco Use   Smoking status: Never   Smokeless tobacco: Never  Vaping Use   Vaping status: Never Used  Substance and Sexual Activity   Alcohol use: Yes    Comment: occ   Drug use: No   Sexual activity: Yes    Birth control/protection: I.U.D.

## 2023-05-12 NOTE — Progress Notes (Signed)
Patient says that she feels about the same. She says that walking around is fine, but that her achilles feels stiff with stairs, activity, and first thing in the morning.

## 2023-05-26 ENCOUNTER — Other Ambulatory Visit: Payer: Self-pay

## 2023-05-26 ENCOUNTER — Other Ambulatory Visit: Payer: Self-pay | Admitting: Orthopedic Surgery

## 2023-05-26 ENCOUNTER — Encounter: Payer: Self-pay | Admitting: Sports Medicine

## 2023-05-26 ENCOUNTER — Ambulatory Visit: Payer: 59 | Admitting: Sports Medicine

## 2023-05-26 DIAGNOSIS — M7661 Achilles tendinitis, right leg: Secondary | ICD-10-CM

## 2023-05-26 DIAGNOSIS — Z981 Arthrodesis status: Secondary | ICD-10-CM

## 2023-05-26 DIAGNOSIS — M25571 Pain in right ankle and joints of right foot: Secondary | ICD-10-CM

## 2023-05-26 MED ORDER — AMITRIPTYLINE HCL 25 MG PO TABS
75.0000 mg | ORAL_TABLET | Freq: Every day | ORAL | 3 refills | Status: DC
  Filled 2023-05-26: qty 90, 30d supply, fill #0
  Filled 2023-06-28: qty 90, 30d supply, fill #1
  Filled 2023-07-24: qty 90, 30d supply, fill #2
  Filled 2023-08-24: qty 90, 30d supply, fill #3

## 2023-05-26 NOTE — Progress Notes (Signed)
Procedure Note  Patient: Kayla Chaney             Date of Birth: Jan 09, 1976           MRN: [id number]             Visit Date: 05/26/2023  HPI: [name] is a very pleasant 47 year old female who presents today for f/u of chronic right ankle pain and achilles tendinopathy.   We have performed 2 sessions of extracorporeal shockwave therapy and she has had vast improvement with her pain. Currently feels like the ankle is 95% improved today, although does wax and wane with activity.  Procedures: Visit Diagnoses:  1. Pain in right ankle and joints of right foot   2. Noninsertional tendinopathy of right Achilles tendon   3. History of fusion of talotibial joint and subtalar joint     Procedure: ECSWT Indications: Achilles tendinopathy, soft tissue restriction status post subtalar fusion   Procedure Details Consent: Risks of procedure as well as the alternatives and risks of each were explained to the patient.  Verbal consent for procedure obtained. Time Out: Verified patient identification, verified procedure, site was marked, verified correct patient position. The area was cleaned with alcohol swab.     The right Achilles and peroneals tendon to be associated myofascia (peroneal region) was targeted for Extracorporeal shockwave therapy.    Preset: Achillodynia Power Level: 110 mJ Frequency: 11 Hz Impulse/cycles: 2250 Head size: Regular   The right lateral ankle scar tissue was targeted for Extracorporeal shockwave therapy.    Power Level: 70 mJ Frequency: 8 Hz Impulse/cycles: 750 Head size: Regular   Patient tolerated procedure well without immediate complications.   -Perform extracorporeal shockwave treatment #3, she will follow-up in the next 1-2 weeks if desires repeat treatment  Madelyn Brunner, DO Primary Care Sports Medicine Physician  [facility] - Orthopedics  This note was dictated using Dragon naturally speaking software and may contain errors in syntax,  spelling, or content which have not been identified prior to signing this note.

## 2023-05-26 NOTE — Progress Notes (Signed)
Patient says that she is feeling great today and that she has been having more good days. She says that today, out of 100% she thinks she feels about 95%.

## 2023-06-02 ENCOUNTER — Ambulatory Visit: Payer: 59 | Admitting: Sports Medicine

## 2023-06-05 ENCOUNTER — Other Ambulatory Visit (HOSPITAL_COMMUNITY): Payer: Self-pay

## 2023-06-05 ENCOUNTER — Other Ambulatory Visit: Payer: Self-pay

## 2023-06-05 ENCOUNTER — Other Ambulatory Visit: Payer: Self-pay | Admitting: Internal Medicine

## 2023-06-05 DIAGNOSIS — E782 Mixed hyperlipidemia: Secondary | ICD-10-CM

## 2023-06-05 DIAGNOSIS — Z789 Other specified health status: Secondary | ICD-10-CM

## 2023-06-05 MED ORDER — REPATHA 140 MG/ML ~~LOC~~ SOSY
140.0000 mg | PREFILLED_SYRINGE | SUBCUTANEOUS | 0 refills | Status: DC
Start: 2023-06-05 — End: 2023-08-24
  Filled 2023-06-05: qty 6, 84d supply, fill #0

## 2023-06-13 ENCOUNTER — Other Ambulatory Visit (HOSPITAL_COMMUNITY): Payer: Self-pay

## 2023-06-13 ENCOUNTER — Other Ambulatory Visit: Payer: Self-pay | Admitting: Family

## 2023-06-13 ENCOUNTER — Other Ambulatory Visit: Payer: Self-pay

## 2023-06-13 DIAGNOSIS — M5030 Other cervical disc degeneration, unspecified cervical region: Secondary | ICD-10-CM

## 2023-06-13 MED ORDER — METHOCARBAMOL 500 MG PO TABS
500.0000 mg | ORAL_TABLET | Freq: Three times a day (TID) | ORAL | 3 refills | Status: DC | PRN
Start: 2023-06-13 — End: 2023-10-19
  Filled 2023-06-13: qty 90, 30d supply, fill #0
  Filled 2023-07-09: qty 90, 30d supply, fill #1
  Filled 2023-07-24 – 2023-08-11 (×2): qty 90, 30d supply, fill #2
  Filled 2023-09-24: qty 90, 30d supply, fill #3

## 2023-06-29 ENCOUNTER — Other Ambulatory Visit: Payer: Self-pay

## 2023-07-10 ENCOUNTER — Other Ambulatory Visit: Payer: Self-pay

## 2023-07-19 ENCOUNTER — Ambulatory Visit: Payer: 59 | Admitting: Internal Medicine

## 2023-07-24 ENCOUNTER — Other Ambulatory Visit: Payer: Self-pay

## 2023-08-11 ENCOUNTER — Other Ambulatory Visit (HOSPITAL_COMMUNITY): Payer: Self-pay

## 2023-08-24 ENCOUNTER — Other Ambulatory Visit: Payer: Self-pay

## 2023-08-24 ENCOUNTER — Other Ambulatory Visit: Payer: Self-pay | Admitting: Internal Medicine

## 2023-08-24 ENCOUNTER — Other Ambulatory Visit (HOSPITAL_BASED_OUTPATIENT_CLINIC_OR_DEPARTMENT_OTHER): Payer: Self-pay

## 2023-08-24 ENCOUNTER — Encounter (HOSPITAL_COMMUNITY): Payer: Self-pay

## 2023-08-24 ENCOUNTER — Other Ambulatory Visit (HOSPITAL_COMMUNITY): Payer: Self-pay

## 2023-08-24 DIAGNOSIS — Z789 Other specified health status: Secondary | ICD-10-CM

## 2023-08-24 DIAGNOSIS — E782 Mixed hyperlipidemia: Secondary | ICD-10-CM

## 2023-08-24 MED ORDER — REPATHA 140 MG/ML ~~LOC~~ SOSY
140.0000 mg | PREFILLED_SYRINGE | SUBCUTANEOUS | 0 refills | Status: DC
Start: 2023-08-24 — End: 2024-01-01
  Filled 2023-08-24: qty 6, 84d supply, fill #0

## 2023-08-25 ENCOUNTER — Ambulatory Visit (INDEPENDENT_AMBULATORY_CARE_PROVIDER_SITE_OTHER): Payer: 59 | Admitting: Internal Medicine

## 2023-08-25 ENCOUNTER — Other Ambulatory Visit: Payer: Self-pay

## 2023-08-25 ENCOUNTER — Encounter: Payer: Self-pay | Admitting: Internal Medicine

## 2023-08-25 VITALS — BP 138/85 | HR 85 | Temp 98.5°F | Resp 16 | Ht 65.5 in | Wt 183.0 lb

## 2023-08-25 DIAGNOSIS — E785 Hyperlipidemia, unspecified: Secondary | ICD-10-CM

## 2023-08-25 DIAGNOSIS — E669 Obesity, unspecified: Secondary | ICD-10-CM | POA: Insufficient documentation

## 2023-08-25 DIAGNOSIS — E663 Overweight: Secondary | ICD-10-CM | POA: Insufficient documentation

## 2023-08-25 DIAGNOSIS — J069 Acute upper respiratory infection, unspecified: Secondary | ICD-10-CM | POA: Diagnosis not present

## 2023-08-25 MED ORDER — HYDROCODONE BIT-HOMATROP MBR 5-1.5 MG/5ML PO SOLN
5.0000 mL | Freq: Three times a day (TID) | ORAL | 0 refills | Status: DC | PRN
Start: 2023-08-25 — End: 2023-10-11

## 2023-08-25 MED ORDER — BENZONATATE 200 MG PO CAPS
200.0000 mg | ORAL_CAPSULE | Freq: Two times a day (BID) | ORAL | 0 refills | Status: DC | PRN
Start: 2023-08-25 — End: 2023-09-13

## 2023-08-25 NOTE — Patient Instructions (Signed)
It was a pleasure to see you today.  Thank you for giving [place] the opportunity to be involved in your care.  Below is a brief recap of your visit and next steps.  We will plan to see you again in 6 months.  Summary Tessalon perles refilled today Hycodan added for nightly use Repeat labs ordered  Follow up in 6 months

## 2023-08-25 NOTE — Assessment & Plan Note (Signed)
Her weight today is 183 pounds.  BMI 29.9.  She requests a referral to a dietitian to discuss dietary changes aimed at weight loss and improving her cholesterol. -Medical nutrition therapy referral placed today

## 2023-08-25 NOTE — Assessment & Plan Note (Signed)
Lipid panel updated in May.  Total cholesterol 175 and LDL 86.  Significantly improved from December 2023 with starting Repatha.  She remains on Repatha.  Repeat lipid panel ordered today.  Medical nutrition therapy referral placed at patient's request to discuss dietary habits aimed at weight loss and further improving her cholesterol.

## 2023-08-25 NOTE — Progress Notes (Signed)
Established Patient Office Visit  Subjective   Patient ID: Kayla Chaney, female    DOB: 04/22/1976  Age: 47 y.o. MRN: [id number]  Chief Complaint  Patient presents with   Cough    X 5 days, Started with sore throat and 101 fever for a day then coughing. OTC meds seem to work during the day but it gets bad at night. Wants some tessalon and cough syrup for the night. Bringing up some clear mucus   Weight Check    Wants a referral to nutritionist and to discuss her cholesterol    Kayla Chaney returns to care today for routine follow up.  She was last evaluated by me on 5/15.  No medication changes were made, repeat lipid panel ordered, and 14-month follow-up arranged.  In the interim, she has been evaluated by sports medicine in the setting of chronic right ankle pain.  Acute visit at [facility] on 8/29 for suspected UTI.  There have otherwise been no acute interval events.  Kayla Chaney reports feeling  fairly well today.  She endorses a persistent cough with throat irritation for the last 5 days.  Her daughter recently had a viral upper respiratory infection and she believes she caught it over the holidays.  Home COVID-19 testing was negative.  Her acute concern today is requesting a referral to a nutritionist to discuss dietary habits aimed at weight loss and improving her cholesterol.  Past Medical History:  Diagnosis Date   Complication of anesthesia    COVID 08/2020   mild case   Hot flashes due to menopause 07/09/2021   Hyperlipidemia    IUD (intrauterine device) in place 07/09/2021   PONV (postoperative nausea and vomiting)    just nausea   Past Surgical History:  Procedure Laterality Date   ANKLE ARTHROSCOPY Right 07/20/2022   Procedure: RIGHT ANKLE ARTHROSCOPY;  Surgeon: Nadara Mustard, MD;  Location: [facility] OR;  Service: Orthopedics;  Laterality: Right;   CESAREAN SECTION  2003, 2008   COLONOSCOPY WITH PROPOFOL N/A 11/20/2020   Procedure: COLONOSCOPY WITH PROPOFOL;  Surgeon: Lanelle Bal, DO;  [facility];  Service: Endoscopy;  Laterality: N/A;  am/ASA II, pt tested +1/24 thru [employer] <90 - results emailed to [place] - advised pt current arrival time of 7:00 - needed to know for transportation cy 3/21   DILITATION & CURRETTAGE/HYSTROSCOPY WITH ESSURE N/A 2008   Retained placenta from c-section birth   FOOT ARTHRODESIS Right 10/27/2021   Procedure: RIGHT SUBTALAR AND TALONAVICULAR FUSION;  Surgeon: Nadara Mustard, MD;  Location: [facility] OR;  Service: Orthopedics;  Laterality: Right;   HARDWARE REMOVAL Right 07/20/2022   Procedure: REMOVAL SCREW RIGHT ANKLE;  Surgeon: Nadara Mustard, MD;  Location: [facility] OR;  Service: Orthopedics;  Laterality: Right;   POLYPECTOMY  11/20/2020   Procedure: POLYPECTOMY;  Surgeon: Lanelle Bal, DO;  [facility];  Service: Endoscopy;;   Social History   Tobacco Use   Smoking status: Never   Smokeless tobacco: Never  Vaping Use   Vaping status: Never Used  Substance Use Topics   Alcohol use: Yes    Comment: occ   Drug use: No   Family History  Problem Relation Age of Onset   Stroke Mother    Hypertension Father    Diabetes Father    Brain cancer Sister    Stroke Maternal Grandmother    No Known Allergies  Review of Systems  Constitutional:  Positive for  chills, fever and malaise/fatigue.  HENT:  Positive for congestion.   Respiratory:  Positive for cough. Negative for sputum production.   All other systems reviewed and are negative.    Objective:     BP 138/85   Pulse 85   Temp 98.5 F (36.9 C) (Oral)   Resp 16   Ht 5' 5.5" (1.664 m)   Wt 183 lb (83 kg)   SpO2 91%   BMI 29.99 kg/m  BP Readings from Last 3 Encounters:  08/25/23 138/85  01/25/23 127/80  01/11/23 126/84   Physical Exam Vitals reviewed.  Constitutional:      General: She is not in acute distress.    Appearance: Normal appearance. She is not toxic-appearing.  HENT:     Head: Normocephalic and atraumatic.      Right Ear: External ear normal.     Left Ear: External ear normal.     Nose: Congestion and rhinorrhea present.     Mouth/Throat:     Mouth: Mucous membranes are moist.     Pharynx: Oropharynx is clear. No oropharyngeal exudate or posterior oropharyngeal erythema.  Eyes:     General: No scleral icterus.    Extraocular Movements: Extraocular movements intact.     Conjunctiva/sclera: Conjunctivae normal.     Pupils: Pupils are equal, round, and reactive to light.  Cardiovascular:     Rate and Rhythm: Normal rate and regular rhythm.     Pulses: Normal pulses.     Heart sounds: Normal heart sounds. No murmur heard.    No friction rub. No gallop.  Pulmonary:     Effort: Pulmonary effort is normal.     Breath sounds: Normal breath sounds. No wheezing, rhonchi or rales.  Abdominal:     General: Abdomen is flat. Bowel sounds are normal. There is no distension.     Palpations: Abdomen is soft.     Tenderness: There is no abdominal tenderness.  Musculoskeletal:        General: No swelling. Normal range of motion.     Cervical back: Normal range of motion.     Right lower leg: No edema.     Left lower leg: No edema.  Lymphadenopathy:     Cervical: No cervical adenopathy.  Skin:    General: Skin is warm and dry.     Capillary Refill: Capillary refill takes less than 2 seconds.     Coloration: Skin is not jaundiced.  Neurological:     General: No focal deficit present.     Mental Status: She is alert and oriented to person, place, and time.  Psychiatric:        Mood and Affect: Mood normal.        Behavior: Behavior normal.   Last CBC Lab Results  Component Value Date   WBC 7.9 08/26/2022   HGB 13.4 08/26/2022   HCT 39.2 08/26/2022   MCV 92 08/26/2022   MCH 31.3 08/26/2022   RDW 12.3 08/26/2022   PLT 274 08/26/2022   Last metabolic panel Lab Results  Component Value Date   GLUCOSE 81 08/26/2022   NA 138 08/26/2022   K 4.1 08/26/2022   CL 99 08/26/2022   CO2 22 08/26/2022    BUN 14 08/26/2022   CREATININE 0.63 08/26/2022   EGFR 110 08/26/2022   CALCIUM 9.6 08/26/2022   PROT 7.1 08/26/2022   ALBUMIN 4.5 08/26/2022   LABGLOB 2.6 08/26/2022   AGRATIO 1.7 08/26/2022   BILITOT 0.3 08/26/2022   ALKPHOS  72 08/26/2022   AST 16 08/26/2022   ALT 15 08/26/2022   Last lipids Lab Results  Component Value Date   CHOL 175 01/27/2023   HDL 49 01/27/2023   LDLCALC 86 01/27/2023   TRIG 239 (H) 01/27/2023   CHOLHDL 3.6 01/27/2023   Last hemoglobin A1c Lab Results  Component Value Date   HGBA1C 5.3 08/26/2022   Last thyroid functions Lab Results  Component Value Date   TSH 1.980 08/26/2022   Last vitamin D Lab Results  Component Value Date   VD25OH 36.8 08/26/2022   Last vitamin B12 and Folate Lab Results  Component Value Date   VITAMINB12 385 08/26/2022   FOLATE >20.0 08/26/2022   The 10-year ASCVD risk score (Arnett DK, et al., 2019) is: 1.2%    Assessment & Plan:   Problem List Items Addressed This Visit       Viral upper respiratory tract infection with cough   She endorses a 5-day history of congestion and nonproductive cough.  Her daughter recently had a viral URI and she believes she contracted it from her over the holidays.  Home COVID-19 testing was negative.  She has been using OTC cough/cold medications for symptom relief.  Cough is worse at night. -Tessalon Perles and Hycodan cough syrup added for as needed cough relief -She will return to care if symptoms worsen or fail to improve      Hyperlipemia   Lipid panel updated in May.  Total cholesterol 175 and LDL 86.  Significantly improved from December 2023 with starting Repatha.  She remains on Repatha.  Repeat lipid panel ordered today.  Medical nutrition therapy referral placed at patient's request to discuss dietary habits aimed at weight loss and further improving her cholesterol.      Overweight (BMI 25.0-29.9) - Primary   Her weight today is 183 pounds.  BMI 29.9.  She  requests a referral to a dietitian to discuss dietary changes aimed at weight loss and improving her cholesterol. -Medical nutrition therapy referral placed today       Return in about 6 months (around 02/23/2024).    Billie Lade, MD

## 2023-08-25 NOTE — Assessment & Plan Note (Signed)
She endorses a 5-day history of congestion and nonproductive cough.  Her daughter recently had a viral URI and she believes she contracted it from her over the holidays.  Home COVID-19 testing was negative.  She has been using OTC cough/cold medications for symptom relief.  Cough is worse at night. -Tessalon Perles and Hycodan cough syrup added for as needed cough relief -She will return to care if symptoms worsen or fail to improve

## 2023-08-29 ENCOUNTER — Encounter: Payer: Self-pay | Admitting: Internal Medicine

## 2023-08-29 DIAGNOSIS — J019 Acute sinusitis, unspecified: Secondary | ICD-10-CM

## 2023-08-29 DIAGNOSIS — J069 Acute upper respiratory infection, unspecified: Secondary | ICD-10-CM

## 2023-08-29 MED ORDER — AZITHROMYCIN 250 MG PO TABS: ORAL_TABLET | ORAL | 0 refills | Status: DC

## 2023-09-05 ENCOUNTER — Ambulatory Visit (INDEPENDENT_AMBULATORY_CARE_PROVIDER_SITE_OTHER): Payer: 59 | Admitting: Orthopedic Surgery

## 2023-09-05 DIAGNOSIS — M25571 Pain in right ankle and joints of right foot: Secondary | ICD-10-CM | POA: Diagnosis not present

## 2023-09-11 ENCOUNTER — Encounter: Payer: Self-pay | Admitting: Orthopedic Surgery

## 2023-09-11 DIAGNOSIS — M25571 Pain in right ankle and joints of right foot: Secondary | ICD-10-CM | POA: Diagnosis not present

## 2023-09-11 MED ORDER — LIDOCAINE HCL 1 % IJ SOLN
2.0000 mL | INTRAMUSCULAR | Status: AC | PRN
Start: 2023-09-11 — End: 2023-09-11
  Administered 2023-09-11: 2 mL

## 2023-09-11 MED ORDER — METHYLPREDNISOLONE ACETATE 40 MG/ML IJ SUSP
40.0000 mg | INTRAMUSCULAR | Status: AC | PRN
Start: 2023-09-11 — End: 2023-09-11
  Administered 2023-09-11: 40 mg via INTRA_ARTICULAR

## 2023-09-11 NOTE — Progress Notes (Signed)
 Office Visit Note   Patient: Kayla Chaney           Date of Birth: July 16, 1976           MRN: [id number] Visit Date: 09/05/2023              Requested by: Melvenia Manus BRAVO, MD [address] [place],  [place] [address] PCP: Melvenia Manus BRAVO, MD  Chief Complaint  Patient presents with   Right Ankle - Pain      HPI: Patient is is a 48 year old woman who presents with right ankle pain.  She did see Dr. Burnetta in September and had interval relief with shockwave therapy.  Patient states she is developing ankle pain that has been relieved in the past with intra-articular injection.  Assessment & Plan: Visit Diagnoses:  1. Pain in right ankle and joints of right foot     Plan: Right ankle was injected without complication.  Discussed that she can follow-up with Dr. Burnetta for shockwave therapy for the peroneal tendons.  Follow-Up Instructions: Return if symptoms worsen or fail to improve.   Ortho Exam  Patient is alert, oriented, no adenopathy, well-dressed, normal affect, normal respiratory effort. Examination patient is tender to palpation anteriorly over the ankle.  She also has some tenderness to palpation over the peroneal tendons.  Pain primarily over the lateral joint line of the right ankle.  Imaging: No results found. No images are attached to the encounter.  Labs: Lab Results  Component Value Date   HGBA1C 5.3 08/26/2022   LABORGA ESCHERICHIA COLI 10/28/2014     Lab Results  Component Value Date   ALBUMIN 4.5 08/26/2022   ALBUMIN 4.4 07/03/2020   ALBUMIN 4.5 08/13/2019    No results found for: MG Lab Results  Component Value Date   VD25OH 36.8 08/26/2022   VD25OH 38.5 01/25/2019    No results found for: PREALBUMIN    Latest Ref Rng & Units 08/26/2022   12:32 PM 07/20/2022    7:05 AM 10/27/2021    7:38 AM  CBC EXTENDED  WBC 3.4 - 10.8 x10E3/uL 7.9  7.3  5.7   RBC 3.77 - 5.28 x10E6/uL 4.28  4.48  4.01   Hemoglobin 11.1 - 15.9 g/dL 86.5   85.5  87.3   HCT 34.0 - 46.6 % 39.2  40.7  36.7   Platelets 150 - 450 x10E3/uL 274  278  245   NEUT# 1.4 - 7.0 x10E3/uL 4.8     Lymph# 0.7 - 3.1 x10E3/uL 2.7        There is no height or weight on file to calculate BMI.  Orders:  No orders of the defined types were placed in this encounter.  No orders of the defined types were placed in this encounter.    Procedures: Medium Joint Inj: R ankle on 09/11/2023 12:30 PM Indications: pain and diagnostic evaluation Details: 22 G 1.5 in needle, anteromedial approach Medications: 2 mL lidocaine  1 %; 40 mg methylPREDNISolone  acetate 40 MG/ML Outcome: tolerated well, no immediate complications Procedure, treatment alternatives, risks and benefits explained, specific risks discussed. Consent was given by the patient. Immediately prior to procedure a time out was called to verify the correct patient, procedure, equipment, support staff and site/side marked as required. Patient was prepped and draped in the usual sterile fashion.      Clinical Data: No additional findings.  ROS:  All other systems negative, except as noted in the HPI. Review of Systems  Objective: Vital Signs: There were no vitals taken for this visit.  Specialty Comments:  No specialty comments available.  PMFS History: Patient Active Problem List   Diagnosis Date Noted   Viral upper respiratory tract infection with cough 08/25/2023   Overweight (BMI 25.0-29.9) 08/25/2023   Impingement syndrome of right ankle 07/20/2022   Pain from implanted hardware 07/20/2022   Encounter for annual general medical examination with abnormal findings in adult 07/13/2022   Posterior tibialis tendon insufficiency    Palpitations 07/09/2021   Hot flashes due to menopause 07/09/2021   IUD (intrauterine device) in place 07/09/2021   Hyperlipemia 07/09/2021   Insomnia 11/13/2013   Degenerative joint disease of cervical spine 11/13/2013   Past Medical History:  Diagnosis Date    Complication of anesthesia    COVID 08/2020   mild case   Hot flashes due to menopause 07/09/2021   Hyperlipidemia    IUD (intrauterine device) in place 07/09/2021   PONV (postoperative nausea and vomiting)    just nausea    Family History  Problem Relation Age of Onset   Stroke Mother    Hypertension Father    Diabetes Father    Brain cancer Sister    Stroke Maternal Grandmother     Past Surgical History:  Procedure Laterality Date   ANKLE ARTHROSCOPY Right 07/20/2022   Procedure: RIGHT ANKLE ARTHROSCOPY;  Surgeon: Harden Jerona GAILS, MD;  Location: [facility] OR;  Service: Orthopedics;  Laterality: Right;   CESAREAN SECTION  2003, 2008   COLONOSCOPY WITH PROPOFOL  N/A 11/20/2020   Procedure: COLONOSCOPY WITH PROPOFOL ;  Surgeon: Cindie Carlin POUR, DO;  [facility];  Service: Endoscopy;  Laterality: N/A;  am/ASA II, pt tested +1/24 thru [employer] <90 - results emailed to [place] - advised pt current arrival time of 7:00 - needed to know for transportation cy 3/21   DILITATION & CURRETTAGE/HYSTROSCOPY WITH ESSURE N/A 2008   Retained placenta from c-section birth   FOOT ARTHRODESIS Right 10/27/2021   Procedure: RIGHT SUBTALAR AND TALONAVICULAR FUSION;  Surgeon: Harden Jerona GAILS, MD;  Location: [facility] OR;  Service: Orthopedics;  Laterality: Right;   HARDWARE REMOVAL Right 07/20/2022   Procedure: REMOVAL SCREW RIGHT ANKLE;  Surgeon: Harden Jerona GAILS, MD;  Location: [facility] OR;  Service: Orthopedics;  Laterality: Right;   POLYPECTOMY  11/20/2020   Procedure: POLYPECTOMY;  Surgeon: Cindie Carlin POUR, DO;  [facility];  Service: Endoscopy;;   Social History   Occupational History    Comment: XRAY tech  Tobacco Use   Smoking status: Never   Smokeless tobacco: Never  Vaping Use   Vaping status: Never Used  Substance and Sexual Activity   Alcohol use: Yes    Comment: occ   Drug use: No   Sexual activity: Yes    Birth control/protection: I.U.D.

## 2023-09-13 ENCOUNTER — Other Ambulatory Visit (HOSPITAL_COMMUNITY): Payer: Self-pay

## 2023-09-13 ENCOUNTER — Other Ambulatory Visit: Payer: Self-pay

## 2023-09-13 MED ORDER — BENZONATATE 200 MG PO CAPS
200.0000 mg | ORAL_CAPSULE | Freq: Two times a day (BID) | ORAL | 0 refills | Status: DC | PRN
Start: 2023-09-13 — End: 2023-10-11
  Filled 2023-09-13: qty 20, 10d supply, fill #0

## 2023-09-13 NOTE — Addendum Note (Signed)
 Addended by: Paulena Servais E on: 09/13/2023 12:53 PM   Modules accepted: Orders

## 2023-09-18 ENCOUNTER — Encounter: Payer: Self-pay | Admitting: Sports Medicine

## 2023-09-19 ENCOUNTER — Ambulatory Visit: Payer: 59 | Admitting: Sports Medicine

## 2023-09-20 ENCOUNTER — Other Ambulatory Visit: Payer: Self-pay | Admitting: Family

## 2023-09-20 ENCOUNTER — Other Ambulatory Visit: Payer: Self-pay

## 2023-09-20 ENCOUNTER — Other Ambulatory Visit (HOSPITAL_COMMUNITY): Payer: Self-pay

## 2023-09-20 MED ORDER — AMITRIPTYLINE HCL 25 MG PO TABS
75.0000 mg | ORAL_TABLET | Freq: Every day | ORAL | 3 refills | Status: DC
  Filled 2023-09-20: qty 90, 30d supply, fill #0
  Filled 2023-10-19: qty 90, 30d supply, fill #1
  Filled 2023-11-23: qty 90, 30d supply, fill #2
  Filled 2023-12-18: qty 90, 30d supply, fill #3

## 2023-09-24 ENCOUNTER — Other Ambulatory Visit (HOSPITAL_COMMUNITY): Payer: Self-pay

## 2023-09-29 ENCOUNTER — Ambulatory Visit: Payer: 59 | Admitting: Sports Medicine

## 2023-09-29 ENCOUNTER — Encounter: Payer: Self-pay | Admitting: Sports Medicine

## 2023-09-29 ENCOUNTER — Other Ambulatory Visit (INDEPENDENT_AMBULATORY_CARE_PROVIDER_SITE_OTHER): Payer: Self-pay

## 2023-09-29 DIAGNOSIS — Z981 Arthrodesis status: Secondary | ICD-10-CM | POA: Diagnosis not present

## 2023-09-29 DIAGNOSIS — M25571 Pain in right ankle and joints of right foot: Secondary | ICD-10-CM

## 2023-09-29 DIAGNOSIS — M6788 Other specified disorders of synovium and tendon, other site: Secondary | ICD-10-CM

## 2023-09-29 NOTE — Progress Notes (Signed)
Patient says that her ankle and tendon pain has been gradually returning. She says that she got an injection into the ankle joint with Dr. Lajoyce Corners and he recommended she do more shockwave therapy for the tendons. She says that she only takes Relafen as needed, and only used ice after the injection.

## 2023-09-29 NOTE — Progress Notes (Signed)
Kayla Chaney - 48 y.o. female MRN [id number]  Date of birth: September 28, 1975  Office Visit Note: Visit Date: 09/29/2023 PCP: Billie Lade, MD Referred by: Billie Lade, MD  Subjective: Chief Complaint  Patient presents with   Right Ankle - Follow-up   HPI: Kayla Chaney is a pleasant 48 y.o. female who presents today for follow-up of chronic right lateral ankle pain and peroneal tendinopathy.  She has a history of chronic right ankle pain as well as compensatory Achilles tendinopathy.  Back in the late summer we did perform a few treatments of extracorporeal shockwave therapy which certainly improved her tendon and lateral ankle pain.  They have started to slowly return.  She did have good improvement from the ankle joint itself after injection with Dr. Lajoyce Corners.  She is interested in trialing PRP injection therapy - has questions about this today.  Pertinent ROS were reviewed with the patient and found to be negative unless otherwise specified above in HPI.   Assessment & Plan: Visit Diagnoses:  1. Pain in right ankle and joints of right foot   2. Peroneal tendinosis   3. History of fusion of talotibial joint and subtalar joint    Plan: Impression is acute on chronic lateral ankle pain which is mainly emanating from her peroneal tendons with a degree of tendinosis noted on ultrasound.  She does have some pain emanating from the ankle joint from her history of fusion surgery, although this is improved with prior corticosteroid injection.  We did proceed with a treatment of extracorporeal shockwave therapy today as she has received benefit from this in the past.  She is interested in proceeding with PRP injection therapy in March.  We will plan this at the intersection level of the peroneal tendons.  I did give my handout for pre and postinjection protocol which was reviewed today.  She will schedule this at her leisure.  We can consider additional shockwave either before or after  this if desires.  Follow-up: Return for schedule for R-peroneal PRP injection (30-min).   Meds & Orders: No orders of the defined types were placed in this encounter.   Orders Placed This Encounter  Procedures   [place] Extrem Low Right Ltd     Procedures: Procedure: ECSWT Indications: Achilles tendinopathy, soft tissue restriction status post subtalar fusion   Procedure Details Consent: Risks of procedure as well as the alternatives and risks of each were explained to the patient.  Verbal consent for procedure obtained. Time Out: Verified patient identification, verified procedure, site was marked, verified correct patient position. The area was cleaned with alcohol swab.     The right peroneals tendon to be associated myofascia (peroneal region) was targeted for Extracorporeal shockwave therapy.    Preset: Tendinitis Power Level: 80-90 mJ Frequency: 10-12 Hz Impulse/cycles: 2250 Head size: Regular      Clinical History: No specialty comments available.  She reports that she has never smoked. She has never used smokeless tobacco. No results for input(s): "HGBA1C", "LABURIC" in the last 8760 hours.  Objective:    Physical Exam  Gen: Well-appearing, in no acute distress; non-toxic CV: Well-perfused. Warm.  Resp: Breathing unlabored on room air; no wheezing. Psych: Fluid speech in conversation; appropriate affect; normal thought process  Ortho Exam - Right ankle: + TTP specifically with endrange inversion of the peroneal tendons just posterior and superior to the lateral malleolus.  There is well-healed incision from prior subtalar and talonavicular fusion.  There is no true  tenderness about the Achilles.  Imaging: [place] Extrem Low Right Ltd Result Date: 09/29/2023 *This ultrasound was for planning purposes, this was a no charge ultrasound. *Will plan for PRP in the intersection of the peroneal tendons.  Past Medical/Family/Surgical/Social History: Medications & Allergies  reviewed per EMR, new medications updated. Patient Active Problem List   Diagnosis Date Noted   Viral upper respiratory tract infection with cough 08/25/2023   Overweight (BMI 25.0-29.9) 08/25/2023   Impingement syndrome of right ankle 07/20/2022   Pain from implanted hardware 07/20/2022   Encounter for annual general medical examination with abnormal findings in adult 07/13/2022   Posterior tibialis tendon insufficiency    Palpitations 07/09/2021   Hot flashes due to menopause 07/09/2021   IUD (intrauterine device) in place 07/09/2021   Hyperlipemia 07/09/2021   Insomnia 11/13/2013   Degenerative joint disease of cervical spine 11/13/2013   Past Medical History:  Diagnosis Date   Complication of anesthesia    COVID 08/2020   mild case   Hot flashes due to menopause 07/09/2021   Hyperlipidemia    IUD (intrauterine device) in place 07/09/2021   PONV (postoperative nausea and vomiting)    just nausea   Family History  Problem Relation Age of Onset   Stroke Mother    Hypertension Father    Diabetes Father    Brain cancer Sister    Stroke Maternal Grandmother    Past Surgical History:  Procedure Laterality Date   ANKLE ARTHROSCOPY Right 07/20/2022   Procedure: RIGHT ANKLE ARTHROSCOPY;  Surgeon: Nadara Mustard, MD;  Location: [facility] OR;  Service: Orthopedics;  Laterality: Right;   CESAREAN SECTION  2003, 2008   COLONOSCOPY WITH PROPOFOL N/A 11/20/2020   Procedure: COLONOSCOPY WITH PROPOFOL;  Surgeon: Lanelle Bal, DO;  [facility];  Service: Endoscopy;  Laterality: N/A;  am/ASA II, pt tested +1/24 thru [employer] [facility] <90 - results emailed to [place] - advised pt current arrival time of 7:00 - needed to know for transportation cy 3/21   DILITATION & CURRETTAGE/HYSTROSCOPY WITH ESSURE N/A 2008   Retained placenta from c-section birth   FOOT ARTHRODESIS Right 10/27/2021   Procedure: RIGHT SUBTALAR AND TALONAVICULAR FUSION;  Surgeon: Nadara Mustard, MD;  Location: [facility]  OR;  Service: Orthopedics;  Laterality: Right;   HARDWARE REMOVAL Right 07/20/2022   Procedure: REMOVAL SCREW RIGHT ANKLE;  Surgeon: Nadara Mustard, MD;  Location: [facility] OR;  Service: Orthopedics;  Laterality: Right;   POLYPECTOMY  11/20/2020   Procedure: POLYPECTOMY;  Surgeon: Lanelle Bal, DO;  [facility];  Service: Endoscopy;;   Social History   Occupational History    Comment: XRAY tech  Tobacco Use   Smoking status: Never   Smokeless tobacco: Never  Vaping Use   Vaping status: Never Used  Substance and Sexual Activity   Alcohol use: Yes    Comment: occ   Drug use: No   Sexual activity: Yes    Birth control/protection: I.U.D.

## 2023-10-04 ENCOUNTER — Ambulatory Visit: Payer: 59 | Admitting: Nutrition

## 2023-10-11 ENCOUNTER — Other Ambulatory Visit (HOSPITAL_COMMUNITY): Payer: Self-pay

## 2023-10-11 ENCOUNTER — Ambulatory Visit: Payer: 59 | Admitting: Obstetrics & Gynecology

## 2023-10-11 ENCOUNTER — Encounter: Payer: Self-pay | Admitting: Obstetrics & Gynecology

## 2023-10-11 VITALS — BP 117/70 | HR 82 | Ht 65.5 in | Wt 182.0 lb

## 2023-10-11 DIAGNOSIS — Z30433 Encounter for removal and reinsertion of intrauterine contraceptive device: Secondary | ICD-10-CM

## 2023-10-11 DIAGNOSIS — Z79890 Hormone replacement therapy (postmenopausal): Secondary | ICD-10-CM | POA: Diagnosis not present

## 2023-10-11 DIAGNOSIS — N951 Menopausal and female climacteric states: Secondary | ICD-10-CM

## 2023-10-11 MED ORDER — LEVONORGESTREL 20 MCG/DAY IU IUD
1.0000 | INTRAUTERINE_SYSTEM | Freq: Once | INTRAUTERINE | Status: AC
  Administered 2023-10-11: 1 via INTRAUTERINE

## 2023-10-11 MED ORDER — ESTRADIOL 0.5 MG PO TABS
0.5000 mg | ORAL_TABLET | Freq: Every day | ORAL | 4 refills | Status: AC
Start: 2023-10-11 — End: ?
  Filled 2023-10-11: qty 90, 90d supply, fill #0
  Filled 2024-01-12: qty 90, 90d supply, fill #1
  Filled 2024-04-14: qty 90, 90d supply, fill #2
  Filled 2024-07-10: qty 90, 90d supply, fill #3

## 2023-10-11 NOTE — Progress Notes (Signed)
GYN VISIT Patient name: Kayla Chaney MRN [id number]  Date of birth: [birth year] Chief Complaint:   Contraception (Iud removal and insertion)  History of Present Illness:   Kayla Chaney is a 48 y.o. [phone]  female being seen today for IUD replacement for HRT management.     HRT: On occasion will have hot flash, but overall symptoms are well controlled Will note spotting on occasion and seems to be more recently.  Denies HMB or dysmenorrhea.  Mirena placed 2016  No LMP recorded. Patient is perimenopausal.    Review of Systems:   Pertinent items are noted in HPI Denies fever/chills, dizziness, headaches, visual disturbances, fatigue, shortness of breath, chest pain, abdominal pain, vomiting, no problems with periods, bowel movements, urination, or intercourse unless otherwise stated above.  Pertinent History Reviewed:   Past Surgical History:  Procedure Laterality Date   ANKLE ARTHROSCOPY Right 07/20/2022   Procedure: RIGHT ANKLE ARTHROSCOPY;  Surgeon: Nadara Mustard, MD;  Location: [facility] OR;  Service: Orthopedics;  Laterality: Right;   CESAREAN SECTION  2003, 2008   COLONOSCOPY WITH PROPOFOL N/A 11/20/2020   Procedure: COLONOSCOPY WITH PROPOFOL;  Surgeon: Lanelle Bal, DO;  Location: [facility];  Service: Endoscopy;  Laterality: N/A;  am/ASA II, pt tested +1/24 thru [employer] [facility] <90 - results emailed to [place] - advised pt current arrival time of 7:00 - needed to know for transportation cy 3/21   DILITATION & CURRETTAGE/HYSTROSCOPY WITH ESSURE N/A 2008   Retained placenta from c-section birth   FOOT ARTHRODESIS Right 10/27/2021   Procedure: RIGHT SUBTALAR AND TALONAVICULAR FUSION;  Surgeon: Nadara Mustard, MD;  Location: [facility] OR;  Service: Orthopedics;  Laterality: Right;   HARDWARE REMOVAL Right 07/20/2022   Procedure: REMOVAL SCREW RIGHT ANKLE;  Surgeon: Nadara Mustard, MD;  Location: [facility] OR;  Service: Orthopedics;  Laterality: Right;   POLYPECTOMY  11/20/2020    Procedure: POLYPECTOMY;  Surgeon: Lanelle Bal, DO;  Location: [facility];  Service: Endoscopy;;    Past Medical History:  Diagnosis Date   Complication of anesthesia    COVID 08/2020   mild case   Hot flashes due to menopause 07/09/2021   Hyperlipidemia    IUD (intrauterine device) in place 07/09/2021   PONV (postoperative nausea and vomiting)    just nausea   Reviewed problem list, medications and allergies. Physical Assessment:   Vitals:   10/11/23 1353  BP: 117/70  Pulse: 82  Weight: 182 lb (82.6 kg)  Height: 5' 5.5" (1.664 m)  Body mass index is 29.83 kg/m.       Physical Examination:   General appearance: alert, well appearing, and in no distress  Psych: mood appropriate, normal affect  Skin: warm & dry   Cardiovascular: normal heart rate noted  Respiratory: normal respiratory effort, no distress  Abdomen: soft, non-tender, no rebound, no guarding  Pelvic: VULVA: normal appearing vulva with no masses, tenderness or lesions, VAGINA: normal appearing vagina with normal color and discharge, no lesions, CERVIX: normal appearing cervix without discharge or lesions- strings not visualized  Extremities: no edema   Chaperone: Faith Rogue    IUD REMOVAL/REPLACEMENT  The risks and benefits of the method and placement have been thouroughly reviewed with the patient and all questions were answered.  Specifically the patient is aware of failure rate of 08/998, expulsion of the IUD and of possible perforation.  The patient is aware of irregular bleeding due to the method and understands the incidence of  irregular bleeding diminishes with time.  Signed copy of informed consent in chart.   A sterile speculum was placed in the vagina.  The cervix was visualized, prepped using Betadine. The strings were not visible. A Single tooth tenaculum was used to grasp the cervix.  Using the hook and forceps the strings were noted and then the strings were grasped and the Mirena IUD was  easily removed. The uterus was found to be anteroflexed and it sounded to 7 cm.  Mirena  IUD placed per manufacturer's recommendations without complications. The strings were trimmed to approximately 3 cm.  The patient tolerated the procedure well.    Assessment & Plan:  1) IUD replacement -device replaced -The patient was given post procedure instructions, including signs and symptoms of infection and to check for the strings after each menses or each month, and refraining from intercourse or anything in the vagina for 2 days. She was given a care card with date IUD placed, and date IUD to be removed.  2) HRT -doing well with current medication and plan to continue  Meds ordered this encounter  Medications   levonorgestrel (MIRENA) 20 MCG/DAY IUD 1 each   estradiol (ESTRACE) 0.5 MG tablet    Sig: Take 1 tablet (0.5 mg total) by mouth daily.    Dispense:  90 tablet    Refill:  4     Return in about 1 year (around 10/10/2024) for Annual.   Myna Hidalgo, DO Attending Obstetrician & Gynecologist, [facility], [facility]

## 2023-10-19 ENCOUNTER — Other Ambulatory Visit (HOSPITAL_COMMUNITY): Payer: Self-pay

## 2023-10-19 ENCOUNTER — Other Ambulatory Visit: Payer: Self-pay | Admitting: Family

## 2023-10-19 DIAGNOSIS — M5030 Other cervical disc degeneration, unspecified cervical region: Secondary | ICD-10-CM

## 2023-10-20 ENCOUNTER — Other Ambulatory Visit: Payer: Self-pay

## 2023-10-20 ENCOUNTER — Other Ambulatory Visit (HOSPITAL_COMMUNITY): Payer: Self-pay

## 2023-10-20 MED ORDER — METHOCARBAMOL 500 MG PO TABS
500.0000 mg | ORAL_TABLET | Freq: Three times a day (TID) | ORAL | 3 refills | Status: DC | PRN
  Filled 2023-10-20: qty 90, 30d supply, fill #0
  Filled 2023-11-23: qty 90, 30d supply, fill #1
  Filled 2023-12-18: qty 90, 30d supply, fill #2
  Filled 2024-01-21: qty 90, 30d supply, fill #3

## 2023-10-24 ENCOUNTER — Encounter: Payer: 59 | Attending: Internal Medicine | Admitting: Nutrition

## 2023-10-24 ENCOUNTER — Encounter: Payer: Self-pay | Admitting: Nutrition

## 2023-10-24 DIAGNOSIS — E663 Overweight: Secondary | ICD-10-CM | POA: Insufficient documentation

## 2023-10-24 DIAGNOSIS — E782 Mixed hyperlipidemia: Secondary | ICD-10-CM | POA: Insufficient documentation

## 2023-10-24 NOTE — Patient Instructions (Signed)
 Goals Eliminate red meat and add fish or chicken in stead Work on breakfast meal prepping DO the Full Plate Living program Add hand weights with walking Keep walking 2 miles a day Keep drinking water Change to almond milk instead of half and half.

## 2023-10-24 NOTE — Progress Notes (Signed)
 Medical Nutrition Therapy  Appointment Start time:  0800  Appointment End time:  0900  Primary concerns today: Overweight , Hyperlipidemia Referral diagnosis: E66.9, E78  Preferred learning style: NO Preference  Learning readiness: Ready   NUTRITION ASSESSMENT  First Visit [employer] Employee  # [phone]  48 yr old wfemale referred for obesity and hyperlipidemia.  Walks 2 miles a day and exercises with some friends 1-2 times per week. Has been focused on tracking calories but not necessarily nutrients. Has a history of heart issues and is on Repatha. TG 238 mg/dl. Gets Hello Fresh meals weekly for her and her husband. Eats red meat twice a week. Likes sweets. Admits to a stress eater and grazer at times.  She is willing to apply Lifestyle Medicine with focus on whole plant predominant foods and 6 pillars of health  Clinical Medical Hx:  Past Medical History:  Diagnosis Date   Complication of anesthesia    COVID 08/2020   mild case   Hot flashes due to menopause 07/09/2021   Hyperlipidemia    IUD (intrauterine device) in place 07/09/2021   PONV (postoperative nausea and vomiting)    just nausea    Medications:  Current Outpatient Medications on File Prior to Visit  Medication Sig Dispense Refill   amitriptyline (ELAVIL) 25 MG tablet Take 3 tablets (75 mg total) by mouth at bedtime. 90 tablet 3   Calcium Carbonate (CALCIUM 500 PO) Take 500 mg by mouth daily.     cholecalciferol (VITAMIN D3) 25 MCG (1000 UNIT) tablet Take 1,000 Units by mouth daily.     estradiol (ESTRACE) 0.5 MG tablet Take 1 tablet (0.5 mg total) by mouth daily. 90 tablet 4   Evolocumab (REPATHA) 140 MG/ML SOSY Inject 140 mg into the skin every 14 (fourteen) days. 6 mL 0   levonorgestrel (MIRENA) 20 MCG/24HR IUD 1 each by Intrauterine route once.     methocarbamol (ROBAXIN) 500 MG tablet Take 1 tablet (500 mg total) by mouth every 8 (eight) hours as needed for muscle spasms. 90 tablet 3   nabumetone (RELAFEN) 750 MG  tablet Take 1 tablet (750 mg total) by mouth 2 (two) times daily as needed for mild pain or moderate pain. with food 60 tablet 3   valACYclovir (VALTREX) 1000 MG tablet Take 2 tablets (2,000 mg total) by mouth at the start of symptoms and then 2 tablets (2,000 mg) 12 hours later. 40 tablet 1   [DISCONTINUED] rosuvastatin (CRESTOR) 5 MG tablet Take 1 tablet (5 mg total) by mouth daily. (Patient not taking: Reported on 09/04/2020) 90 tablet 3   No current facility-administered medications on file prior to visit.     Labs:  Lab Results  Component Value Date   HGBA1C 5.3 08/26/2022      Latest Ref Rng & Units 08/26/2022   12:32 PM 07/03/2020    3:34 PM 08/13/2019    9:36 AM  CMP  Glucose 70 - 99 mg/dL 81  82  76   BUN 6 - 24 mg/dL 14  8  10    Creatinine 0.57 - 1.00 mg/dL 6.04  5.40  9.81   Sodium 134 - 144 mmol/L 138  140  140   Potassium 3.5 - 5.2 mmol/L 4.1  4.0  4.0   Chloride 96 - 106 mmol/L 99  104  104   CO2 20 - 29 mmol/L 22  22  23    Calcium 8.7 - 10.2 mg/dL 9.6  9.5  9.5   Total Protein 6.0 -  8.5 g/dL 7.1  6.9  6.8   Total Bilirubin 0.0 - 1.2 mg/dL 0.3  0.4  0.3   Alkaline Phos 44 - 121 IU/L 72  50  45   AST 0 - 40 IU/L 16  18  16    ALT 0 - 32 IU/L 15  21  11     Lipid Panel     Component Value Date/Time   CHOL 175 01/27/2023 0847   TRIG 239 (H) 01/27/2023 0847   HDL 49 01/27/2023 0847   CHOLHDL 3.6 01/27/2023 0847   LDLCALC 86 01/27/2023 0847   LABVLDL 40 01/27/2023 0847    Notable Signs/Symptoms: None  Lifestyle & Dietary Hx Lives with her husband. Works [employer].  Estimated daily fluid intake: 128 oz oz Supplements: Calcium and Vit AD Sleep: 7/8 hrs.  Stress / self-care: some life stress Current average weekly physical activity: walking 2 miles a day  24-Hr Dietary Recall First Meal: Coffee- 1/2 and 1/2  3 tbsp and spenda, HMR bars protein bar Snack:  Second Meal:  Salad chicken, oil/vinegar dressing Snack: misc candy, crackers jacks Third Meal: hellos fresh  meal-chicken cheese, carrots, mashed potatoes Snack: candy  Beverages: water  Estimated Energy Needs Calories: 1200 Carbohydrate: 135g Protein: 90g Fat: 33g   NUTRITION DIAGNOSIS  NI-1.7 Predicted excessive energy intake As related to higher calorie  high fat /sugar food intake.  As evidenced by BMI 29 and  TG 238 mg/dl.  NUTRITION INTERVENTION  Nutrition education (E-1) on the following topics:  Lifestyle Medicine  - Whole Food, Plant Predominant Nutrition is highly recommended: Eat Plenty of vegetables, Mushrooms, fruits, Legumes, Whole Grains, Nuts, seeds in lieu of processed meats, processed snacks/pastries red meat, poultry, eggs.    -It is better to avoid simple carbohydrates including: Cakes, Sweet Desserts, Ice Cream, Soda (diet and regular), Sweet Tea, Candies, Chips, Cookies, Store Bought Juices, Alcohol in Excess of  1-2 drinks a day, Lemonade,  Artificial Sweeteners, Doughnuts, Coffee Creamers, "Sugar-free" Products, etc, etc.  This is not a complete list.....  Exercise: If you are able: 30 -60 minutes a day ,4 days a week, or 150 minutes a week.  The longer the better.  Combine stretch, strength, and aerobic activities.  If you were told in the past that you have high risk for cardiovascular diseases, you may seek evaluation by your heart doctor prior to initiating moderate to intense exercise programs.   Handouts Provided Include  Lifestyle Medicine handouts  Learning Style & Readiness for Change Teaching method utilized: Visual & Auditory  Demonstrated degree of understanding via: Teach Back  Barriers to learning/adherence to lifestyle change: none  Goals Established by Pt Goals Eliminate red meat and add fish or chicken in stead Work on breakfast meal prepping DO the Full Plate Living program Add hand weights with walking Keep walking 2 miles a day Keep drinking water Change to almond milk instead of half and half.   MONITORING & EVALUATION Dietary intake,  weekly physical activity, and weight in 2 months.  Next Steps  Patient is to work on meal planning and whole plant based foods.Marland Kitchen

## 2023-11-23 ENCOUNTER — Other Ambulatory Visit (HOSPITAL_COMMUNITY): Payer: Self-pay

## 2023-11-24 ENCOUNTER — Other Ambulatory Visit: Payer: Self-pay

## 2023-11-24 ENCOUNTER — Encounter: Payer: Self-pay | Admitting: Sports Medicine

## 2023-11-24 ENCOUNTER — Ambulatory Visit: Payer: 59 | Admitting: Sports Medicine

## 2023-11-24 DIAGNOSIS — M25571 Pain in right ankle and joints of right foot: Secondary | ICD-10-CM

## 2023-11-24 DIAGNOSIS — M6788 Other specified disorders of synovium and tendon, other site: Secondary | ICD-10-CM

## 2023-11-24 DIAGNOSIS — Z981 Arthrodesis status: Secondary | ICD-10-CM

## 2023-11-24 NOTE — Progress Notes (Signed)
     Kayla Chaney is a pleasant 48 year old female who was seen and evaluated in my office today.  She did undergo a procedure and does need to follow the current work/activity recommendations:  -She should remain in a Tall CAM walker boot for the next 7 days starting today, 11/24/2023. -Please allow for seated work only for the next 1 week from today's visit. Seated work restrictions can be lifted starting on Monday, December 04, 2023 and she may return to full duty without restrictions at that time. -If any further restrictions are needed past this point, she will reach out to me and she will present for follow-up to discuss.   If there are any questions, please do not hesitate to call the office.  [phone].  Dr. Madelyn Brunner, DO Primary Care Sports Medicine Physician  [name]

## 2023-11-24 NOTE — Progress Notes (Signed)
   Procedure Note  Patient: Kayla Chaney             Date of Birth: Aug 13, 1976           MRN: [id number]             Visit Date: 11/24/2023  Procedures: Visit Diagnoses:  1. Peroneal tendinosis   2. Pain in right ankle and joints of right foot   3. History of fusion of talotibial joint and subtalar joint    Procedure: Lateral ankle and Peroneal Tendon Injection, Right Ankle   After informed verbal consent was obtained, a timeout was performed. Patient was lying lateral recumbent on exam table with the lateral ankle facing upward.  The overlying area was prepped with ChloraPrep and alcohol swabs.  The soft tissue only was first anesthetized with 2 cc of lidocaine 1% near the region of the peroneal tendons and the lateral gutter of the ankle, no anesthetic was placed into the tendon sheath ankle joint itself.  Then, using ultrasound guidance via an in-plane approach, a 22G, 1.5" needle was inserted from a posterior to anterior direction into the peroneal tendon sheath of the longus and brevis and subsequently injected with 4 cc of platelet-rich-plasma (leukocyte-rich).  Visualization of injectate spread within the peroneal tendon sheath was visualized dynamically under ultrasound guidance.  Then, the lateral ankle gutter with resultant scar tissue was injected with the additional 2 cc of platelet-rich-plasma (leukocyte-rich) under ultrasound guidance.  Patient tolerated the procedure well without immediate complications.  Kit: RegenLab: RegenPlasma; THT-3 kit   *Post-PRP Injection Guidelines: No anti-inflammatories (ibuprofen/motrin, aleve, meloxicam, etc.) for 2 weeks.  No ice for 2 weeks.  Short prescription of tramadol may be written if pain is severe, call if needed. Appropriate rest / bracing / and timeframe for beginning therapy discussed and patient endorsed understanding. Patient will remain in Tall CAM walker boot x 1 week and then transition out of this. No full activity x 2  weeks.        Madelyn Brunner, DO Primary Care Sports Medicine Physician  [facility] - Orthopedics  This note was dictated using Dragon naturally speaking software and may contain errors in syntax, spelling, or content which have not been identified prior to signing this note.

## 2023-11-27 ENCOUNTER — Encounter: Payer: Self-pay | Admitting: Sports Medicine

## 2023-11-28 ENCOUNTER — Ambulatory Visit

## 2023-12-05 ENCOUNTER — Encounter: Payer: Self-pay | Admitting: Orthopedic Surgery

## 2023-12-05 ENCOUNTER — Other Ambulatory Visit: Payer: Self-pay

## 2023-12-05 ENCOUNTER — Other Ambulatory Visit: Payer: Self-pay | Admitting: Orthopedic Surgery

## 2023-12-05 MED ORDER — GABAPENTIN 300 MG PO CAPS
300.0000 mg | ORAL_CAPSULE | Freq: Three times a day (TID) | ORAL | 3 refills | Status: DC
  Filled 2023-12-05: qty 90, 30d supply, fill #0
  Filled 2024-02-19: qty 90, 30d supply, fill #1
  Filled 2024-03-23: qty 90, 30d supply, fill #2

## 2023-12-12 ENCOUNTER — Encounter: Payer: 59 | Attending: Internal Medicine | Admitting: Nutrition

## 2023-12-12 ENCOUNTER — Encounter: Payer: Self-pay | Admitting: Nutrition

## 2023-12-12 VITALS — Ht 65.5 in | Wt 182.0 lb

## 2023-12-12 DIAGNOSIS — E663 Overweight: Secondary | ICD-10-CM | POA: Insufficient documentation

## 2023-12-12 DIAGNOSIS — E782 Mixed hyperlipidemia: Secondary | ICD-10-CM | POA: Insufficient documentation

## 2023-12-12 NOTE — Patient Instructions (Signed)
 Goals  Aim to get to 5 grams of fiber per  Try cashew cream sauce Try Bliss creamer or soy for coffee Keep working out

## 2023-12-12 NOTE — Progress Notes (Signed)
 Medical Nutrition Therapy  Appointment Start time:  0830 Appointment End time: 0900  Primary concerns today: Overweight , Hyperlipidemia Referral diagnosis: E66.9, E78  Preferred learning style: NO Preference  Learning readiness: Ready   NUTRITION ASSESSMENT  Second Visit [employer] Employee  # [phone]  48 yr old wfemale referred for obesity and hyperlipidemia follow up Has done a great job making lifestyle changes. Has cut out red meat.  Back to walking 2 miles a day Had  a ankle procedure. Now able to get back to exercising. Eating more fish of salmon or tuna every other week. Has cut back on splenda. Has been working on meal planning. Eating more oatmeal on days off. Has done Full Plate Living. Has cut out a lot of processed food and empty calories of sweets and manufactured bars and commercialized food products. Feels much better. Goals set previously.  Eliminate red meat and add fish or chicken instead-done Work on breakfast meal prepping-done DO the Full Plate Living program-working on it Add hand weights with walking-starting to do it Keep walking 2 miles a day-back to it after surgery Keep drinking water-done Change to almond milk instead of half and half.-will change to almond bliss or soy milk.  Weight stable. She is willing to apply Lifestyle Medicine with focus on whole plant predominant foods and 6 pillars of health  Clinical Wt Readings from Last 3 Encounters:  12/12/23 182 lb (82.6 kg)  10/11/23 182 lb (82.6 kg)  08/25/23 183 lb (83 kg)   Ht Readings from Last 3 Encounters:  12/12/23 5' 5.5" (1.664 m)  10/11/23 5' 5.5" (1.664 m)  08/25/23 5' 5.5" (1.664 m)   Body mass index is 29.83 kg/m. @BMIFA @ Facility age limit for growth %iles is 20 years. Facility age limit for growth %iles is 20 years.  Medical Hx:  Past Medical History:  Diagnosis Date   Complication of anesthesia    COVID 08/2020   mild case   Hot flashes due to menopause 07/09/2021    Hyperlipidemia    IUD (intrauterine device) in place 07/09/2021   PONV (postoperative nausea and vomiting)    just nausea    Medications:  Current Outpatient Medications on File Prior to Visit  Medication Sig Dispense Refill   gabapentin (NEURONTIN) 300 MG capsule Take 1 capsule (300 mg total) by mouth 3 (three) times daily when necessary for neuropathy pain 90 capsule 3   amitriptyline (ELAVIL) 25 MG tablet Take 3 tablets (75 mg total) by mouth at bedtime. 90 tablet 3   Calcium Carbonate (CALCIUM 500 PO) Take 500 mg by mouth daily.     cholecalciferol (VITAMIN D3) 25 MCG (1000 UNIT) tablet Take 1,000 Units by mouth daily.     estradiol (ESTRACE) 0.5 MG tablet Take 1 tablet (0.5 mg total) by mouth daily. 90 tablet 4   Evolocumab (REPATHA) 140 MG/ML SOSY Inject 140 mg into the skin every 14 (fourteen) days. 6 mL 0   levonorgestrel (MIRENA) 20 MCG/24HR IUD 1 each by Intrauterine route once.     methocarbamol (ROBAXIN) 500 MG tablet Take 1 tablet (500 mg total) by mouth every 8 (eight) hours as needed for muscle spasms. 90 tablet 3   nabumetone (RELAFEN) 750 MG tablet Take 1 tablet (750 mg total) by mouth 2 (two) times daily as needed for mild pain or moderate pain. with food 60 tablet 3   valACYclovir (VALTREX) 1000 MG tablet Take 2 tablets (2,000 mg total) by mouth at the start of symptoms and then  2 tablets (2,000 mg) 12 hours later. 40 tablet 1   [DISCONTINUED] rosuvastatin (CRESTOR) 5 MG tablet Take 1 tablet (5 mg total) by mouth daily. (Patient not taking: Reported on 09/04/2020) 90 tablet 3   No current facility-administered medications on file prior to visit.     Labs:  Lab Results  Component Value Date   HGBA1C 5.3 08/26/2022      Latest Ref Rng & Units 08/26/2022   12:32 PM 07/03/2020    3:34 PM 08/13/2019    9:36 AM  CMP  Glucose 70 - 99 mg/dL 81  82  76   BUN 6 - 24 mg/dL 14  8  10    Creatinine 0.57 - 1.00 mg/dL 1.61  0.96  0.45   Sodium 134 - 144 mmol/L 138  140  140    Potassium 3.5 - 5.2 mmol/L 4.1  4.0  4.0   Chloride 96 - 106 mmol/L 99  104  104   CO2 20 - 29 mmol/L 22  22  23    Calcium 8.7 - 10.2 mg/dL 9.6  9.5  9.5   Total Protein 6.0 - 8.5 g/dL 7.1  6.9  6.8   Total Bilirubin 0.0 - 1.2 mg/dL 0.3  0.4  0.3   Alkaline Phos 44 - 121 IU/L 72  50  45   AST 0 - 40 IU/L 16  18  16    ALT 0 - 32 IU/L 15  21  11     Lipid Panel     Component Value Date/Time   CHOL 175 01/27/2023 0847   TRIG 239 (H) 01/27/2023 0847   HDL 49 01/27/2023 0847   CHOLHDL 3.6 01/27/2023 0847   LDLCALC 86 01/27/2023 0847   LABVLDL 40 01/27/2023 0847    Notable Signs/Symptoms: None  Lifestyle & Dietary Hx Lives with her husband. Works [employer].  Estimated daily fluid intake: 128 oz oz Supplements: Calcium and Vit AD Sleep: 7/8 hrs.  Stress / self-care: some life stress Current average weekly physical activity: walking 2 miles a day  24-Hr Dietary Recall First Meal: Coffee- 1/2 and 1/2  3 tbsp and spenda, HMR bars protein bar Snack:  Second Meal:  Salad chicken, oil/vinegar dressing Snack: misc candy, crackers jacks Third Meal: hellos fresh meal-chicken cheese, carrots, mashed potatoes Snack: candy  Beverages: water  Estimated Energy Needs Calories: 1200 Carbohydrate: 135g Protein: 90g Fat: 33g   NUTRITION DIAGNOSIS  NI-1.7 Predicted excessive energy intake As related to higher calorie  high fat /sugar food intake.  As evidenced by BMI 29 and  TG 238 mg/dl.  NUTRITION INTERVENTION  Nutrition education (E-1) on the following topics:  Lifestyle Medicine  - Whole Food, Plant Predominant Nutrition is highly recommended: Eat Plenty of vegetables, Mushrooms, fruits, Legumes, Whole Grains, Nuts, seeds in lieu of processed meats, processed snacks/pastries red meat, poultry, eggs.    -It is better to avoid simple carbohydrates including: Cakes, Sweet Desserts, Ice Cream, Soda (diet and regular), Sweet Tea, Candies, Chips, Cookies, Store Bought Juices, Alcohol in  Excess of  1-2 drinks a day, Lemonade,  Artificial Sweeteners, Doughnuts, Coffee Creamers, "Sugar-free" Products, etc, etc.  This is not a complete list.....  Exercise: If you are able: 30 -60 minutes a day ,4 days a week, or 150 minutes a week.  The longer the better.  Combine stretch, strength, and aerobic activities.  If you were told in the past that you have high risk for cardiovascular diseases, you may seek evaluation by your heart doctor  prior to initiating moderate to intense exercise programs.   Handouts Provided Include  Lifestyle Medicine handouts  Learning Style & Readiness for Change Teaching method utilized: Visual & Auditory  Demonstrated degree of understanding via: Teach Back  Barriers to learning/adherence to lifestyle change: none  Goals Established by Pt  Aim to get to 25 grams of fiber per  Try cashew cream sauce Try Bliss creamer or soy for coffee Keep working out MONITORING & EVALUATION Dietary intake, weekly physical activity, and weight in 6 months.  Next Steps  Patient is to work on meal planning and whole plant based foods.Aaron Aas

## 2023-12-18 ENCOUNTER — Encounter: Payer: Self-pay | Admitting: Internal Medicine

## 2023-12-18 ENCOUNTER — Other Ambulatory Visit (HOSPITAL_COMMUNITY): Payer: Self-pay

## 2023-12-21 ENCOUNTER — Encounter: Payer: Self-pay | Admitting: Sports Medicine

## 2023-12-29 ENCOUNTER — Ambulatory Visit: Admitting: Internal Medicine

## 2024-01-01 ENCOUNTER — Other Ambulatory Visit: Payer: Self-pay | Admitting: Internal Medicine

## 2024-01-01 DIAGNOSIS — E782 Mixed hyperlipidemia: Secondary | ICD-10-CM

## 2024-01-01 DIAGNOSIS — Z789 Other specified health status: Secondary | ICD-10-CM

## 2024-01-02 ENCOUNTER — Telehealth: Payer: Self-pay | Admitting: Pharmacy Technician

## 2024-01-02 ENCOUNTER — Other Ambulatory Visit (HOSPITAL_COMMUNITY): Payer: Self-pay

## 2024-01-02 MED ORDER — REPATHA 140 MG/ML ~~LOC~~ SOSY
140.0000 mg | PREFILLED_SYRINGE | SUBCUTANEOUS | 0 refills | Status: DC
  Filled 2024-01-02 – 2024-01-03 (×2): qty 6, 84d supply, fill #0

## 2024-01-02 NOTE — Telephone Encounter (Signed)
 Pharmacy Patient Advocate Encounter   Received notification from CoverMyMeds that prior authorization for Repatha  140MG /ML syringes is required/requested.   Insurance verification completed.   The patient is insured through [facility] .   Per test claim: PA required; PA submitted to above mentioned insurance via CoverMyMeds Key/confirmation #/EOC [id number] Status is pending

## 2024-01-03 ENCOUNTER — Other Ambulatory Visit (HOSPITAL_COMMUNITY): Payer: Self-pay

## 2024-01-04 ENCOUNTER — Other Ambulatory Visit (HOSPITAL_COMMUNITY): Payer: Self-pay

## 2024-01-04 ENCOUNTER — Other Ambulatory Visit: Payer: Self-pay

## 2024-01-04 NOTE — Telephone Encounter (Signed)
 Pharmacy Patient Advocate Encounter  Received notification from [facility] that Prior Authorization for Repatha  140MG /ML syringes has been APPROVED from 01/03/2024 to 01/01/2025. Ran test claim, Copay is $24.99. This test claim was processed through [facility]- copay amounts may vary at other pharmacies due to pharmacy/plan contracts, or as the patient moves through the different stages of their insurance plan.   PA #/Case ID/Reference #: [id number]

## 2024-01-15 ENCOUNTER — Other Ambulatory Visit (HOSPITAL_COMMUNITY): Payer: Self-pay

## 2024-01-22 ENCOUNTER — Other Ambulatory Visit (HOSPITAL_COMMUNITY): Payer: Self-pay

## 2024-01-30 ENCOUNTER — Encounter: Payer: Self-pay | Admitting: Sports Medicine

## 2024-02-16 ENCOUNTER — Encounter: Payer: Self-pay | Admitting: Sports Medicine

## 2024-02-16 ENCOUNTER — Ambulatory Visit: Admitting: Sports Medicine

## 2024-02-16 ENCOUNTER — Other Ambulatory Visit: Payer: Self-pay

## 2024-02-16 DIAGNOSIS — M6788 Other specified disorders of synovium and tendon, other site: Secondary | ICD-10-CM

## 2024-02-16 DIAGNOSIS — Z981 Arthrodesis status: Secondary | ICD-10-CM

## 2024-02-16 DIAGNOSIS — M25571 Pain in right ankle and joints of right foot: Secondary | ICD-10-CM

## 2024-02-16 NOTE — Progress Notes (Signed)
   Procedure Note  Patient: Kayla Chaney             Date of Birth: 1976/07/22           MRN: [id number]             Visit Date: 02/16/2024  Procedures: Visit Diagnoses:  1. Peroneal tendinosis   2. Pain in right ankle and joints of right foot   3. History of fusion of talotibial joint and subtalar joint    Procedure: Lateral ankle and Peroneal Tendon PRP Injection, Right Ankle    After informed verbal consent was obtained, a timeout was performed. Patient was lying lateral recumbent on exam table with the lateral ankle facing upward.  The overlying area was prepped with ChloraPrep and alcohol swabs.  The soft tissue only was first anesthetized with 2 cc of lidocaine  1% near the region of the peroneal tendons and the lateral gutter of the ankle, no anesthetic was placed into the tendon sheath ankle joint itself.  Then, using ultrasound guidance via an in-plane approach, a 22G, 1.5 needle was inserted from a posterior to anterior direction into the peroneal tendon sheath of the longus and brevis and subsequently injected with 3.5 cc of platelet-rich-plasma (leukocyte-rich).  Visualization of injectate spread within the peroneal tendon sheath was visualized dynamically under ultrasound guidance.  Then, the lateral ankle gutter with resultant scar tissue was injected with the additional 2.5 cc of platelet-rich-plasma (leukocyte-rich) under ultrasound guidance.  Patient tolerated the procedure well without immediate complications.       Kit: RegenLab: RegenPlasma; THT-3 kit   *Post-PRP Injection Guidelines: No anti-inflammatories (ibuprofen/motrin, aleve, meloxicam , etc.) for 2 weeks.  No ice for 2 weeks.  Short prescription of tramadol may be written if pain is severe, call if needed. Appropriate rest / bracing / and timeframe for beginning therapy discussed and patient endorsed understanding. Patient will remain in Tall CAM walker boot x 1 week and then transition out of this. No full  activity x 2 weeks.

## 2024-02-19 ENCOUNTER — Other Ambulatory Visit (HOSPITAL_COMMUNITY): Payer: Self-pay

## 2024-02-19 ENCOUNTER — Other Ambulatory Visit: Payer: Self-pay | Admitting: Family

## 2024-02-19 DIAGNOSIS — M5030 Other cervical disc degeneration, unspecified cervical region: Secondary | ICD-10-CM

## 2024-02-19 MED ORDER — METHOCARBAMOL 500 MG PO TABS
500.0000 mg | ORAL_TABLET | Freq: Three times a day (TID) | ORAL | 3 refills | Status: DC | PRN
  Filled 2024-02-19: qty 90, 30d supply, fill #0
  Filled 2024-03-23: qty 90, 30d supply, fill #1
  Filled 2024-04-22: qty 90, 30d supply, fill #2
  Filled 2024-05-17: qty 90, 30d supply, fill #3

## 2024-02-19 MED ORDER — AMITRIPTYLINE HCL 25 MG PO TABS
75.0000 mg | ORAL_TABLET | Freq: Every day | ORAL | 3 refills | Status: DC
  Filled 2024-02-19: qty 90, 30d supply, fill #0

## 2024-02-20 ENCOUNTER — Other Ambulatory Visit: Payer: Self-pay

## 2024-02-20 ENCOUNTER — Other Ambulatory Visit (HOSPITAL_COMMUNITY): Payer: Self-pay

## 2024-02-21 ENCOUNTER — Encounter: Payer: Self-pay | Admitting: Internal Medicine

## 2024-02-23 ENCOUNTER — Ambulatory Visit: Payer: 59 | Admitting: Internal Medicine

## 2024-02-27 ENCOUNTER — Encounter: Payer: Self-pay | Admitting: Internal Medicine

## 2024-02-27 ENCOUNTER — Other Ambulatory Visit: Payer: Self-pay

## 2024-02-27 ENCOUNTER — Ambulatory Visit: Admitting: Internal Medicine

## 2024-02-27 VITALS — BP 137/78 | HR 79 | Ht 65.5 in | Wt 183.6 lb

## 2024-02-27 DIAGNOSIS — Z0001 Encounter for general adult medical examination with abnormal findings: Secondary | ICD-10-CM | POA: Diagnosis not present

## 2024-02-27 DIAGNOSIS — E559 Vitamin D deficiency, unspecified: Secondary | ICD-10-CM | POA: Diagnosis not present

## 2024-02-27 DIAGNOSIS — E538 Deficiency of other specified B group vitamins: Secondary | ICD-10-CM

## 2024-02-27 DIAGNOSIS — M6788 Other specified disorders of synovium and tendon, other site: Secondary | ICD-10-CM | POA: Insufficient documentation

## 2024-02-27 DIAGNOSIS — F5101 Primary insomnia: Secondary | ICD-10-CM | POA: Diagnosis not present

## 2024-02-27 DIAGNOSIS — E663 Overweight: Secondary | ICD-10-CM | POA: Diagnosis not present

## 2024-02-27 DIAGNOSIS — R739 Hyperglycemia, unspecified: Secondary | ICD-10-CM | POA: Diagnosis not present

## 2024-02-27 DIAGNOSIS — E7801 Familial hypercholesterolemia: Secondary | ICD-10-CM | POA: Diagnosis not present

## 2024-02-27 MED ORDER — HYDROXYZINE HCL 10 MG PO TABS
20.0000 mg | ORAL_TABLET | Freq: Every day | ORAL | 5 refills | Status: DC
  Filled 2024-02-27: qty 60, 30d supply, fill #0

## 2024-02-27 NOTE — Assessment & Plan Note (Signed)
 Check lipid profile Has responded well to Repatha  LDL has been up to 225 - likely has familial hypercholesterolemia

## 2024-02-27 NOTE — Progress Notes (Signed)
 Established Patient Office Visit  Subjective:  Patient ID: Kayla Chaney, female    DOB: 07/10/1976  Age: 48 y.o. MRN: [id number]  CC:  Chief Complaint  Patient presents with   Medical Management of Chronic Issues    6 month f/u, would like lab orders to [facility] .     HPI Kayla Chaney is a 48 y.o. female with past medical history of HLD who presents for annual physical.  HLD: She has responded well to Repatha . Her LDL had improved to 86 in 94/75 from 225 in 12/23.  She has improved her diet as well after consultation with nutritionist.  Insomnia: She currently takes amitriptyline  50 mg at bedtime, which was prescribed for complex regional pain syndrome from ankle surgery.  She is currently doing better in terms of her ankle pain and prefers to switch back to hydroxyzine .  Peroneal tendinosis: S/p PRP injection, followed by Ortho care [facility].   Past Medical History:  Diagnosis Date   Complication of anesthesia    COVID 08/2020   mild case   Hot flashes due to menopause 07/09/2021   Hyperlipidemia    IUD (intrauterine device) in place 07/09/2021   PONV (postoperative nausea and vomiting)    just nausea    Past Surgical History:  Procedure Laterality Date   ANKLE ARTHROSCOPY Right 07/20/2022   Procedure: RIGHT ANKLE ARTHROSCOPY;  Surgeon: Harden Jerona GAILS, MD;  Location: [facility] OR;  Service: Orthopedics;  Laterality: Right;   CESAREAN SECTION  2003, 2008   COLONOSCOPY WITH PROPOFOL  N/A 11/20/2020   Procedure: COLONOSCOPY WITH PROPOFOL ;  Surgeon: Cindie Carlin POUR, DO;  [facility];  Service: Endoscopy;  Laterality: N/A;  am/ASA II, pt tested +1/24 thru [employer] <90 - results emailed to [place] - advised pt current arrival time of 7:00 - needed to know for transportation cy 3/21   DILITATION & CURRETTAGE/HYSTROSCOPY WITH ESSURE N/A 2008   Retained placenta from c-section birth   FOOT ARTHRODESIS Right 10/27/2021   Procedure: RIGHT SUBTALAR AND TALONAVICULAR  FUSION;  Surgeon: Harden Jerona GAILS, MD;  Location: [facility] OR;  Service: Orthopedics;  Laterality: Right;   HARDWARE REMOVAL Right 07/20/2022   Procedure: REMOVAL SCREW RIGHT ANKLE;  Surgeon: Harden Jerona GAILS, MD;  Location: [facility] OR;  Service: Orthopedics;  Laterality: Right;   POLYPECTOMY  11/20/2020   Procedure: POLYPECTOMY;  Surgeon: Cindie Carlin POUR, DO;  [facility];  Service: Endoscopy;;    Family History  Problem Relation Age of Onset   Stroke Mother    Hypertension Father    Diabetes Father    Brain cancer Sister    Stroke Maternal Grandmother     Social History   Socioeconomic History   Marital status: Married    Spouse name: Not on file   Number of children: Not on file   Years of education: Not on file   Highest education level: Some college, no degree  Occupational History    Comment: XRAY tech  Tobacco Use   Smoking status: Never   Smokeless tobacco: Never  Vaping Use   Vaping status: Never Used  Substance and Sexual Activity   Alcohol use: Yes    Comment: occ   Drug use: No   Sexual activity: Yes    Birth control/protection: I.U.D.  Other Topics Concern   Not on file  Social History Narrative   Not on file   Social Drivers of Health   Financial Resource Strain: Low Risk  (  08/21/2023)   Overall Financial Resource Strain (CARDIA)    Difficulty of Paying Living Expenses: Not very hard  Food Insecurity: No Food Insecurity (08/21/2023)   Hunger Vital Sign    Worried About Running Out of Food in the Last Year: Never true    Ran Out of Food in the Last Year: Never true  Transportation Needs: No Transportation Needs (08/21/2023)   PRAPARE - Administrator, Civil Service (Medical): No    Lack of Transportation (Non-Medical): No  Physical Activity: Sufficiently Active (08/21/2023)   Exercise Vital Sign    Days of Exercise per Week: 3 days    Minutes of Exercise per Session: 60 min  Stress: Stress Concern Present (08/21/2023)   [employer] of Occupational Health - Occupational Stress Questionnaire    Feeling of Stress : To some extent  Social Connections: Socially Integrated (08/21/2023)   Social Connection and Isolation Panel    Frequency of Communication with Friends and Family: Once a week    Frequency of Social Gatherings with Friends and Family: Twice a week    Attends Religious Services: More than 4 times per year    Active Member of [employer] or Organizations: Yes    Attends Engineer, structural: More than 4 times per year    Marital Status: Married  Catering manager Violence: Not At Risk (08/24/2022)   Humiliation, Afraid, Rape, and Kick questionnaire    Fear of Current or Ex-Partner: No    Emotionally Abused: No    Physically Abused: No    Sexually Abused: No    Outpatient Medications Prior to Visit  Medication Sig Dispense Refill   amitriptyline  (ELAVIL ) 25 MG tablet Take 3 tablets (75 mg total) by mouth at bedtime. 90 tablet 3   Calcium  Carbonate (CALCIUM  500 PO) Take 500 mg by mouth daily.     cholecalciferol (VITAMIN D3) 25 MCG (1000 UNIT) tablet Take 1,000 Units by mouth daily.     estradiol  (ESTRACE ) 0.5 MG tablet Take 1 tablet (0.5 mg total) by mouth daily. 90 tablet 4   Evolocumab  (REPATHA ) 140 MG/ML SOSY Inject 140 mg into the skin every 14 (fourteen) days. 6 mL 0   gabapentin  (NEURONTIN ) 300 MG capsule Take 1 capsule (300 mg total) by mouth 3 (three) times daily when necessary for neuropathy pain 90 capsule 3   levonorgestrel  (MIRENA ) 20 MCG/24HR IUD 1 each by Intrauterine route once.     methocarbamol  (ROBAXIN ) 500 MG tablet Take 1 tablet (500 mg total) by mouth every 8 (eight) hours as needed for muscle spasms. 90 tablet 3   valACYclovir  (VALTREX ) 1000 MG tablet Take 2 tablets (2,000 mg total) by mouth at the start of symptoms and then 2 tablets (2,000 mg) 12 hours later. 40 tablet 1   nabumetone  (RELAFEN ) 750 MG tablet Take 1 tablet (750 mg total) by mouth 2 (two) times daily as needed  for mild pain or moderate pain. with food 60 tablet 3   No facility-administered medications prior to visit.    No Known Allergies  ROS Review of Systems  Constitutional:  Negative for chills and fever.  HENT:  Negative for congestion, sinus pressure, sinus pain and sore throat.   Eyes:  Negative for pain and discharge.  Respiratory:  Negative for cough and shortness of breath.   Cardiovascular:  Negative for chest pain and palpitations.  Gastrointestinal:  Negative for abdominal pain, diarrhea, nausea and vomiting.  Endocrine: Negative for polydipsia and polyuria.  Genitourinary:  Negative for dysuria and hematuria.  Musculoskeletal:  Negative for neck pain and neck stiffness.       Right foot/ankle pain  Skin:  Negative for rash.  Neurological:  Negative for dizziness and weakness.  Psychiatric/Behavioral:  Negative for agitation and behavioral problems.       Objective:    Physical Exam Vitals reviewed.  Constitutional:      General: She is not in acute distress.    Appearance: She is not diaphoretic.  HENT:     Head: Normocephalic and atraumatic.     Nose: Nose normal. No congestion.     Mouth/Throat:     Mouth: Mucous membranes are moist.     Pharynx: No posterior oropharyngeal erythema.   Eyes:     General: No scleral icterus.    Extraocular Movements: Extraocular movements intact.    Cardiovascular:     Rate and Rhythm: Normal rate and regular rhythm.     Heart sounds: Normal heart sounds. No murmur heard. Pulmonary:     Breath sounds: Normal breath sounds. No wheezing or rales.  Abdominal:     Palpations: Abdomen is soft.     Tenderness: There is no abdominal tenderness.   Musculoskeletal:     Cervical back: Neck supple. No tenderness.     Right lower leg: No edema.     Left lower leg: No edema.   Skin:    General: Skin is warm.     Findings: No rash.   Neurological:     General: No focal deficit present.     Mental Status: She is alert and  oriented to person, place, and time.     Cranial Nerves: No cranial nerve deficit.     Sensory: No sensory deficit.     Motor: No weakness.   Psychiatric:        Mood and Affect: Mood normal.        Behavior: Behavior normal.     BP 137/78   Pulse 79   Ht 5' 5.5 (1.664 m)   Wt 183 lb 9.6 oz (83.3 kg)   SpO2 93%   BMI 30.09 kg/m  Wt Readings from Last 3 Encounters:  02/27/24 183 lb 9.6 oz (83.3 kg)  12/12/23 182 lb (82.6 kg)  10/11/23 182 lb (82.6 kg)    Lab Results  Component Value Date   TSH 1.980 08/26/2022   Lab Results  Component Value Date   WBC 7.9 08/26/2022   HGB 13.4 08/26/2022   HCT 39.2 08/26/2022   MCV 92 08/26/2022   PLT 274 08/26/2022   Lab Results  Component Value Date   NA 138 08/26/2022   K 4.1 08/26/2022   CO2 22 08/26/2022   GLUCOSE 81 08/26/2022   BUN 14 08/26/2022   CREATININE 0.63 08/26/2022   BILITOT 0.3 08/26/2022   ALKPHOS 72 08/26/2022   AST 16 08/26/2022   ALT 15 08/26/2022   PROT 7.1 08/26/2022   ALBUMIN 4.5 08/26/2022   CALCIUM  9.6 08/26/2022   EGFR 110 08/26/2022   Lab Results  Component Value Date   CHOL 175 01/27/2023   Lab Results  Component Value Date   HDL 49 01/27/2023   Lab Results  Component Value Date   LDLCALC 86 01/27/2023   Lab Results  Component Value Date   TRIG 239 (H) 01/27/2023   Lab Results  Component Value Date   CHOLHDL 3.6 01/27/2023   Lab Results  Component Value Date   HGBA1C 5.3 08/26/2022  Assessment & Plan:   Problem List Items Addressed This Visit       Musculoskeletal and Integument   Peroneal tendinosis   S/p PRP injection, has responded well Followed by Ortho care [place]        Other   Insomnia   Restart hydroxyzine  20 mg nightly after completing amitriptyline  taper Will taper amitriptyline  slowly, advised to take amitriptyline  25 mg nightly for 2 weeks and then 12.5 mg for 2 weeks, then discontinue      Relevant Medications   hydrOXYzine  (ATARAX ) 10  MG tablet   Hyperlipemia   Check lipid profile Has responded well to Repatha  LDL has been up to 225 - likely has familial hypercholesterolemia      Relevant Orders   Lipid panel   Encounter for annual general medical examination with abnormal findings in adult - Primary   Physical exam as documented. Fasting blood tests ordered today. PAP smear with Ob.Gyn.      Overweight (BMI 25.0-29.9)   Relevant Orders   CBC with Differential/Platelet   TSH + free T4   Other Visit Diagnoses       Vitamin D  deficiency       Relevant Orders   VITAMIN D  25 Hydroxy (Vit-D Deficiency, Fractures)     Hyperglycemia       Relevant Orders   Hemoglobin A1c   Comprehensive metabolic panel with GFR     B12 deficiency       Relevant Orders   CBC with Differential/Platelet   B12 and Folate Panel       Meds ordered this encounter  Medications   hydrOXYzine  (ATARAX ) 10 MG tablet    Sig: Take 2 tablets (20 mg total) by mouth at bedtime.    Dispense:  60 tablet    Refill:  5    Follow-up: Return in about 6 months (around 08/29/2024) for HLD.    Suzzane MARLA Blanch, MD

## 2024-02-27 NOTE — Assessment & Plan Note (Signed)
 S/p PRP injection, has responded well Followed by Ortho care [facility]

## 2024-02-27 NOTE — Assessment & Plan Note (Signed)
 Restart hydroxyzine  20 mg nightly after completing amitriptyline  taper Will taper amitriptyline  slowly, advised to take amitriptyline  25 mg nightly for 2 weeks and then 12.5 mg for 2 weeks, then discontinue

## 2024-02-27 NOTE — Patient Instructions (Addendum)
 Please start taking Amitriptyline  25 mg instead of 50 mg for 2 weeks and then 1/2 tablet for additional 2 weeks.  Please take Hydroxyzine  as needed for insomnia.  Please continue to take medications as prescribed.  Please continue to follow low carb diet and perform moderate exercise/walking at least 150 mins/week.  Please get fasting blood tests done within a week.

## 2024-02-27 NOTE — Assessment & Plan Note (Signed)
 Physical exam as documented. Fasting blood tests ordered today. PAP smear with Ob.Gyn.

## 2024-02-29 ENCOUNTER — Other Ambulatory Visit (HOSPITAL_COMMUNITY): Payer: Self-pay | Admitting: Internal Medicine

## 2024-02-29 DIAGNOSIS — Z1231 Encounter for screening mammogram for malignant neoplasm of breast: Secondary | ICD-10-CM

## 2024-03-12 DIAGNOSIS — E559 Vitamin D deficiency, unspecified: Secondary | ICD-10-CM | POA: Diagnosis not present

## 2024-03-12 DIAGNOSIS — R739 Hyperglycemia, unspecified: Secondary | ICD-10-CM | POA: Diagnosis not present

## 2024-03-12 DIAGNOSIS — E782 Mixed hyperlipidemia: Secondary | ICD-10-CM | POA: Diagnosis not present

## 2024-03-12 DIAGNOSIS — E663 Overweight: Secondary | ICD-10-CM | POA: Diagnosis not present

## 2024-03-12 DIAGNOSIS — E538 Deficiency of other specified B group vitamins: Secondary | ICD-10-CM | POA: Diagnosis not present

## 2024-03-13 ENCOUNTER — Other Ambulatory Visit: Payer: Self-pay

## 2024-03-13 ENCOUNTER — Encounter: Payer: Self-pay | Admitting: Internal Medicine

## 2024-03-13 ENCOUNTER — Other Ambulatory Visit: Payer: Self-pay | Admitting: Internal Medicine

## 2024-03-13 ENCOUNTER — Other Ambulatory Visit (HOSPITAL_COMMUNITY): Payer: Self-pay

## 2024-03-13 DIAGNOSIS — E7801 Familial hypercholesterolemia: Secondary | ICD-10-CM

## 2024-03-13 LAB — LIPID PANEL
Cholesterol: 183 mg/dL (ref <200)
HDL: 51 mg/dL (ref >=50)
LDL Cholesterol (Calc): 91 mg/dL
Non-HDL Cholesterol (Calc): 132 mg/dL — ABNORMAL HIGH (ref <130)
Total CHOL/HDL Ratio: 3.6 (calc) (ref <5.0)
Triglycerides: 284 mg/dL — ABNORMAL HIGH (ref <150)

## 2024-03-13 LAB — B12 AND FOLATE PANEL
Folate: 14.6 ng/mL
Vitamin B-12: 272 pg/mL (ref 200–1100)

## 2024-03-13 LAB — COMPREHENSIVE METABOLIC PANEL WITH GFR
AG Ratio: 1.6 (calc) (ref 1.0–2.5)
ALT: 19 U/L (ref 6–29)
AST: 19 U/L (ref 10–35)
Albumin: 4 g/dL (ref 3.6–5.1)
Alkaline phosphatase (APISO): 57 U/L (ref 31–125)
BUN: 13 mg/dL (ref 7–25)
CO2: 25 mmol/L (ref 20–32)
Calcium: 9.2 mg/dL (ref 8.6–10.2)
Chloride: 102 mmol/L (ref 98–110)
Creat: 0.59 mg/dL (ref 0.50–0.99)
Globulin: 2.5 g/dL (ref 1.9–3.7)
Glucose, Bld: 77 mg/dL (ref 65–99)
Potassium: 4 mmol/L (ref 3.5–5.3)
Sodium: 136 mmol/L (ref 135–146)
Total Bilirubin: 0.4 mg/dL (ref 0.2–1.2)
Total Protein: 6.5 g/dL (ref 6.1–8.1)
eGFR: 111 mL/min/1.73m2 (ref >=60)

## 2024-03-13 LAB — HEMOGLOBIN A1C
Hgb A1c MFr Bld: 5 % (ref <5.7)
Mean Plasma Glucose: 97 mg/dL
eAG (mmol/L): 5.4 mmol/L

## 2024-03-13 LAB — CBC WITH DIFFERENTIAL/PLATELET
Absolute Lymphocytes: 2258 {cells}/uL (ref 850–3900)
Absolute Monocytes: 299 {cells}/uL (ref 200–950)
Basophils Absolute: 20 {cells}/uL (ref 0–200)
Basophils Relative: 0.3 %
Eosinophils Absolute: 27 {cells}/uL (ref 15–500)
Eosinophils Relative: 0.4 %
HCT: 40.7 % (ref 35.0–45.0)
Hemoglobin: 13.2 g/dL (ref 11.7–15.5)
MCH: 31.2 pg (ref 27.0–33.0)
MCHC: 32.4 g/dL (ref 32.0–36.0)
MCV: 96.2 fL (ref 80.0–100.0)
MPV: 10.7 fL (ref 7.5–12.5)
Monocytes Relative: 4.4 %
Neutro Abs: 4196 {cells}/uL (ref 1500–7800)
Neutrophils Relative %: 61.7 %
Platelets: 219 Thousand/uL (ref 140–400)
RBC: 4.23 Million/uL (ref 3.80–5.10)
RDW: 12.2 % (ref 11.0–15.0)
Total Lymphocyte: 33.2 %
WBC: 6.8 Thousand/uL (ref 3.8–10.8)

## 2024-03-13 LAB — VITAMIN D 25 HYDROXY (VIT D DEFICIENCY, FRACTURES): Vit D, 25-Hydroxy: 31 ng/mL (ref 30–100)

## 2024-03-13 LAB — TSH+FREE T4: TSH W/REFLEX TO FT4: 2.13 m[IU]/L

## 2024-03-13 MED ORDER — FENOFIBRATE 145 MG PO TABS
145.0000 mg | ORAL_TABLET | Freq: Every day | ORAL | 3 refills | Status: DC
  Filled 2024-03-13: qty 90, 90d supply, fill #0
  Filled 2024-06-06: qty 90, 90d supply, fill #1

## 2024-03-15 ENCOUNTER — Ambulatory Visit (HOSPITAL_COMMUNITY)
Admission: RE | Admit: 2024-03-15 | Discharge: 2024-03-15 | Disposition: A | Discharge status: Home / Self Care | Source: Ambulatory Visit

## 2024-03-15 DIAGNOSIS — Z1231 Encounter for screening mammogram for malignant neoplasm of breast: Secondary | ICD-10-CM | POA: Diagnosis not present

## 2024-03-23 ENCOUNTER — Other Ambulatory Visit: Payer: Self-pay | Admitting: Family

## 2024-03-23 ENCOUNTER — Other Ambulatory Visit (HOSPITAL_COMMUNITY): Payer: Self-pay

## 2024-03-25 ENCOUNTER — Other Ambulatory Visit (HOSPITAL_COMMUNITY): Payer: Self-pay

## 2024-03-25 ENCOUNTER — Other Ambulatory Visit: Payer: Self-pay

## 2024-03-25 MED ORDER — NABUMETONE 750 MG PO TABS
750.0000 mg | ORAL_TABLET | Freq: Two times a day (BID) | ORAL | 3 refills | Status: DC | PRN
  Filled 2024-03-25: qty 60, 30d supply, fill #0

## 2024-03-26 ENCOUNTER — Encounter: Payer: Self-pay | Admitting: Internal Medicine

## 2024-03-27 ENCOUNTER — Other Ambulatory Visit: Payer: Self-pay

## 2024-03-27 DIAGNOSIS — F5101 Primary insomnia: Secondary | ICD-10-CM

## 2024-03-27 MED ORDER — HYDROXYZINE HCL 10 MG PO TABS
20.0000 mg | ORAL_TABLET | Freq: Every day | ORAL | 3 refills | Status: DC
  Filled 2024-03-27: qty 180, 90d supply, fill #0

## 2024-04-08 ENCOUNTER — Other Ambulatory Visit (HOSPITAL_BASED_OUTPATIENT_CLINIC_OR_DEPARTMENT_OTHER): Payer: Self-pay

## 2024-04-08 ENCOUNTER — Other Ambulatory Visit: Payer: Self-pay

## 2024-04-08 ENCOUNTER — Other Ambulatory Visit: Payer: Self-pay | Admitting: Internal Medicine

## 2024-04-08 DIAGNOSIS — Z789 Other specified health status: Secondary | ICD-10-CM

## 2024-04-08 DIAGNOSIS — E782 Mixed hyperlipidemia: Secondary | ICD-10-CM

## 2024-04-08 MED ORDER — REPATHA 140 MG/ML ~~LOC~~ SOSY
140.0000 mg | PREFILLED_SYRINGE | SUBCUTANEOUS | 0 refills | Status: DC
  Filled 2024-04-08: qty 6, 84d supply, fill #0

## 2024-04-15 ENCOUNTER — Other Ambulatory Visit (HOSPITAL_COMMUNITY): Payer: Self-pay

## 2024-04-22 ENCOUNTER — Other Ambulatory Visit (HOSPITAL_COMMUNITY): Payer: Self-pay

## 2024-05-02 ENCOUNTER — Encounter: Payer: Self-pay | Admitting: Orthopedic Surgery

## 2024-05-02 DIAGNOSIS — M25571 Pain in right ankle and joints of right foot: Secondary | ICD-10-CM

## 2024-05-13 ENCOUNTER — Ambulatory Visit (HOSPITAL_COMMUNITY)
Admission: RE | Admit: 2024-05-13 | Discharge: 2024-05-13 | Disposition: A | Discharge status: Home / Self Care | Source: Ambulatory Visit | Attending: Orthopedic Surgery | Admitting: Orthopedic Surgery

## 2024-05-13 DIAGNOSIS — M25571 Pain in right ankle and joints of right foot: Secondary | ICD-10-CM | POA: Diagnosis not present

## 2024-05-13 DIAGNOSIS — M19071 Primary osteoarthritis, right ankle and foot: Secondary | ICD-10-CM | POA: Diagnosis not present

## 2024-05-13 DIAGNOSIS — R600 Localized edema: Secondary | ICD-10-CM | POA: Diagnosis not present

## 2024-05-13 DIAGNOSIS — M25471 Effusion, right ankle: Secondary | ICD-10-CM | POA: Diagnosis not present

## 2024-05-13 DIAGNOSIS — M79671 Pain in right foot: Secondary | ICD-10-CM | POA: Diagnosis not present

## 2024-05-14 ENCOUNTER — Telehealth: Payer: Self-pay | Admitting: Radiology

## 2024-05-14 ENCOUNTER — Encounter: Payer: Self-pay | Admitting: Orthopedic Surgery

## 2024-05-14 ENCOUNTER — Ambulatory Visit (INDEPENDENT_AMBULATORY_CARE_PROVIDER_SITE_OTHER): Admitting: Orthopedic Surgery

## 2024-05-14 DIAGNOSIS — Z981 Arthrodesis status: Secondary | ICD-10-CM | POA: Diagnosis not present

## 2024-05-14 NOTE — Progress Notes (Addendum)
 Office Visit Note   Patient: Kayla Chaney           Date of Birth: [birth year]           MRN: [id number] Visit Date: 05/14/2024              Requested by: Tobie Suzzane POUR, MD [address] [place],  [place] [address] PCP: Tobie Suzzane POUR, MD  Chief Complaint  Patient presents with   Right Leg - Pain      HPI: Discussed the use of AI scribe software for clinical note transcription with the patient, who gave verbal consent to proceed.  History of Present Illness Kayla Chaney is a 48 year old female who presents with persistent ankle pain and difficulty walking following overexertion.  She experiences significant ankle pain and difficulty walking. She was unable to walk for a week following this activity. She engages in regular physical activity, walking approximately 20 to 25 miles a week, and has been trying to incorporate strengthening exercises into her routine.  The pain is persistent, and she states, 'I can do everything I want to do, it just hurts.' She uses heat and isometric loading as part of her management strategy. A foot massager with heat is used every morning to help alleviate symptoms and improve mobility.  She has a history of issues with her peroneal tendons and Achilles tendon. A previous MRI scan was not effective in showing a bony union, and she is awaiting a CT scan for further evaluation. She has stopped using cold therapy and contrast baths.  Her current management includes avoiding dynamic strengthening exercises that exacerbate her symptoms and focusing on isometric exercises and heat therapy.     Assessment & Plan: Visit Diagnoses:  1. History of fusion of talotibial joint and subtalar joint     Plan: Assessment and Plan Assessment & Plan Chronic right ankle pain with peroneal and Achilles tendon symptoms Chronic right ankle pain with peroneal and Achilles tendon involvement. Intact tendons with mild tenderness and edema over peroneal tendons.  Concern for fibrous union despite normal x-rays. MRI unreliable for bony union assessment. - Order CT scan of the ankle to evaluate for fibrous union at [facility] and 150 location. - Initiate isometric loading exercises with heat application. - Discontinue cold therapy and contrast baths. - Consider bone stimulator if necessary, but currently not anticipated.      Follow-Up Instructions: Return if symptoms worsen or fail to improve.   Ortho Exam  Patient is alert, oriented, no adenopathy, well-dressed, normal affect, normal respiratory effort. Physical Exam MUSCULOSKELETAL: Mild tenderness to palpation of the peroneal tendons and Achilles tendon. Sinus tarsus not tender to palpation. Peroneal tendons and Achilles tendon intact.      Imaging: MR Ankle Right w/o contrast Result Date: 05/14/2024 CLINICAL DATA:  Right heel pain EXAM: MRI OF THE RIGHT ANKLE WITHOUT CONTRAST TECHNIQUE: Multiplanar, multisequence MR imaging of the ankle was performed. No intravenous contrast was administered. COMPARISON:  X-ray 11/21/2022, MRI 10/08/2021 FINDINGS: TENDONS Peroneal: Peroneus longus and brevis tendons are intact and normally positioned. Posteromedial: Moderate tendinosis of the distal tibialis posterior tendon. No discernible tear. No tenosynovitis. Flexor digitorum longus and flexor hallucis longus tendons are within normal limits. Anterior: Tibialis anterior, extensor hallucis longus, and extensor digitorum longus tendons are intact and normally positioned. Achilles: Intact without tendinosis or tear. No retrocalcaneal bursal fluid. Plantar Fascia: Intact. LIGAMENTS Lateral: Intact tibiofibular ligaments. The anterior and posterior talofibular ligaments are thickened  but intact. Intact calcaneofibular ligament. Medial: The deltoid and visualized portions of the spring ligament appear intact. CARTILAGE AND BONES Ankle Joint: Moderate-severe tibiotalar osteoarthritis with high-grade cartilage  loss, more pronounced at the lateral shoulder, progressed from prior. Trace tibiotalar joint effusion. Subtalar Joints/Sinus Tarsi: Prior subtalar arthrodesis. There is severe degenerative changes across the posterior subtalar joint with reactive bone marrow edema in the calcaneus and talus. No findings to suggest bony fusion across the joint. Preservation of the fat signal within the sinus tarsi. Bones: No acute fracture. No malalignment. Degenerative changes of the second tarsometatarsal joint. Dorsal talonavicular joint arthrodesis without evidence of osseous fusion. No suspicious bone lesion. Other: Subcutaneous edema at the lateral ankle. IMPRESSION: 1. Prior subtalar arthrodesis. There is severe degenerative changes across the posterior subtalar joint with reactive bone marrow edema in the calcaneus and talus. No findings to suggest bony fusion across the joint. 2. No MR evidence to suggest fusion across the talonavicular joint. 3. Moderate-severe tibiotalar osteoarthritis, progressed from prior. 4. Moderate tendinosis of the distal tibialis posterior tendon. Electronically Signed   By: Mabel Converse D.O.   On: 05/14/2024 10:17   No images are attached to the encounter.  Labs: Lab Results  Component Value Date   HGBA1C 5.0 03/12/2024   HGBA1C 5.3 08/26/2022   LABORGA ESCHERICHIA COLI 10/28/2014     Lab Results  Component Value Date   ALBUMIN 4.5 08/26/2022   ALBUMIN 4.4 07/03/2020   ALBUMIN 4.5 08/13/2019    No results found for: MG Lab Results  Component Value Date   VD25OH 31 03/12/2024   VD25OH 36.8 08/26/2022   VD25OH 38.5 01/25/2019    No results found for: PREALBUMIN    Latest Ref Rng & Units 03/12/2024    8:06 AM 08/26/2022   12:32 PM 07/20/2022    7:05 AM  CBC EXTENDED  WBC 3.8 - 10.8 Thousand/uL 6.8  7.9  7.3   RBC 3.80 - 5.10 Million/uL 4.23  4.28  4.48   Hemoglobin 11.7 - 15.5 g/dL 86.7  86.5  85.5   HCT 35.0 - 45.0 % 40.7  39.2  40.7   Platelets 140 -  400 Thousand/uL 219  274  278   NEUT# 1,500 - 7,800 cells/uL 4,196  4.8    Lymph# 0.7 - 3.1 x10E3/uL  2.7       There is no height or weight on file to calculate BMI.  Orders:  Orders Placed This Encounter  Procedures   CT ANKLE RIGHT WO CONTRAST   No orders of the defined types were placed in this encounter.    Procedures: No procedures performed  Clinical Data: No additional findings.  ROS:  All other systems negative, except as noted in the HPI. Review of Systems  Objective: Vital Signs: There were no vitals taken for this visit.  Specialty Comments:  No specialty comments available.  PMFS History: Patient Active Problem List   Diagnosis Date Noted   Peroneal tendinosis 02/27/2024   Overweight (BMI 25.0-29.9) 08/25/2023   Impingement syndrome of right ankle 07/20/2022   Pain from implanted hardware 07/20/2022   Encounter for annual general medical examination with abnormal findings in adult 07/13/2022   Posterior tibialis tendon insufficiency    Palpitations 07/09/2021   Hot flashes due to menopause 07/09/2021   IUD (intrauterine device) in place 07/09/2021   Hyperlipemia 07/09/2021   Insomnia 11/13/2013   Degenerative joint disease of cervical spine 11/13/2013   Past Medical History:  Diagnosis Date   Complication  of anesthesia    COVID 08/2020   mild case   Hot flashes due to menopause 07/09/2021   Hyperlipidemia    IUD (intrauterine device) in place 07/09/2021   PONV (postoperative nausea and vomiting)    just nausea    Family History  Problem Relation Age of Onset   Stroke Mother    Hypertension Father    Diabetes Father    Brain cancer Sister    Stroke Maternal Grandmother     Past Surgical History:  Procedure Laterality Date   ANKLE ARTHROSCOPY Right 07/20/2022   Procedure: RIGHT ANKLE ARTHROSCOPY;  Surgeon: Harden Jerona GAILS, MD;  Location: [facility] OR;  Service: Orthopedics;  Laterality: Right;   CESAREAN SECTION  2003, 2008   COLONOSCOPY  WITH PROPOFOL  N/A 11/20/2020   Procedure: COLONOSCOPY WITH PROPOFOL ;  Surgeon: Cindie Carlin POUR, DO;  [facility];  Service: Endoscopy;  Laterality: N/A;  am/ASA II, pt tested +1/24 thru [employer] at work <90 - results emailed to [place] - advised pt current arrival time of 7:00 - needed to know for transportation cy 3/21   DILITATION & CURRETTAGE/HYSTROSCOPY WITH ESSURE N/A 2008   Retained placenta from c-section birth   FOOT ARTHRODESIS Right 10/27/2021   Procedure: RIGHT SUBTALAR AND TALONAVICULAR FUSION;  Surgeon: Harden Jerona GAILS, MD;  Location: [facility] OR;  Service: Orthopedics;  Laterality: Right;   HARDWARE REMOVAL Right 07/20/2022   Procedure: REMOVAL SCREW RIGHT ANKLE;  Surgeon: Harden Jerona GAILS, MD;  Location: [facility] OR;  Service: Orthopedics;  Laterality: Right;   POLYPECTOMY  11/20/2020   Procedure: POLYPECTOMY;  Surgeon: Cindie Carlin POUR, DO;  [facility];  Service: Endoscopy;;   Social History   Occupational History    Comment: XRAY tech  Tobacco Use   Smoking status: Never   Smokeless tobacco: Never  Vaping Use   Vaping status: Never Used  Substance and Sexual Activity   Alcohol use: Yes    Comment: occ   Drug use: No   Sexual activity: Yes    Birth control/protection: I.U.D.

## 2024-05-14 NOTE — Telephone Encounter (Signed)
 I called reading room, spoke with Ray.  He will ask radiologist to read this MRI so results will be available at today's appt with Dr Harden.

## 2024-05-15 ENCOUNTER — Encounter: Payer: Self-pay | Admitting: Radiology

## 2024-05-15 ENCOUNTER — Inpatient Hospital Stay
Admission: RE | Admit: 2024-05-15 | Discharge: 2024-05-15 | Discharge status: Home / Self Care | Source: Ambulatory Visit | Attending: Orthopedic Surgery | Admitting: Orthopedic Surgery

## 2024-05-15 DIAGNOSIS — M25571 Pain in right ankle and joints of right foot: Secondary | ICD-10-CM | POA: Diagnosis not present

## 2024-05-15 DIAGNOSIS — Z981 Arthrodesis status: Secondary | ICD-10-CM

## 2024-05-16 ENCOUNTER — Ambulatory Visit: Payer: Self-pay

## 2024-05-16 ENCOUNTER — Encounter: Payer: Self-pay | Admitting: Orthopedic Surgery

## 2024-05-16 ENCOUNTER — Other Ambulatory Visit (HOSPITAL_COMMUNITY): Payer: Self-pay

## 2024-05-16 IMAGING — DX DG FOOT COMPLETE 3+V*R*
3 series · 3 of 3 positions shown · non-contrast
Comparison: 01/17/2022

CLINICAL DATA: Blunt trauma to right foot 01/21/2022, mid and
posterior right foot pain, history of recent right foot surgery

EXAM:
RIGHT FOOT COMPLETE - 3+ VIEW

[foot ap]
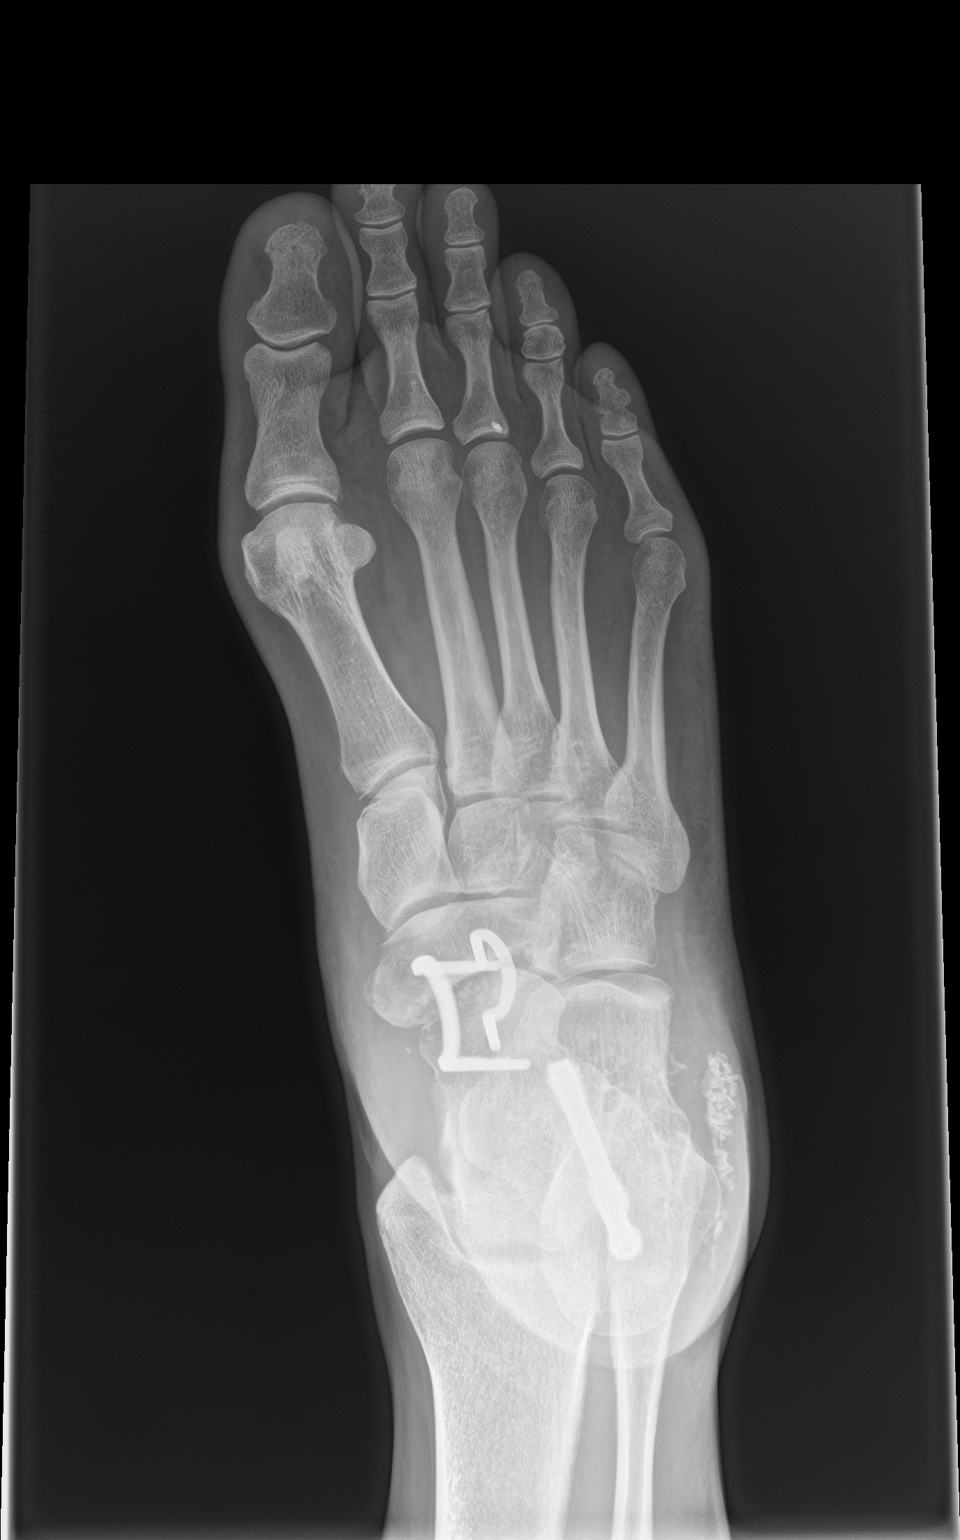

[foot obl]
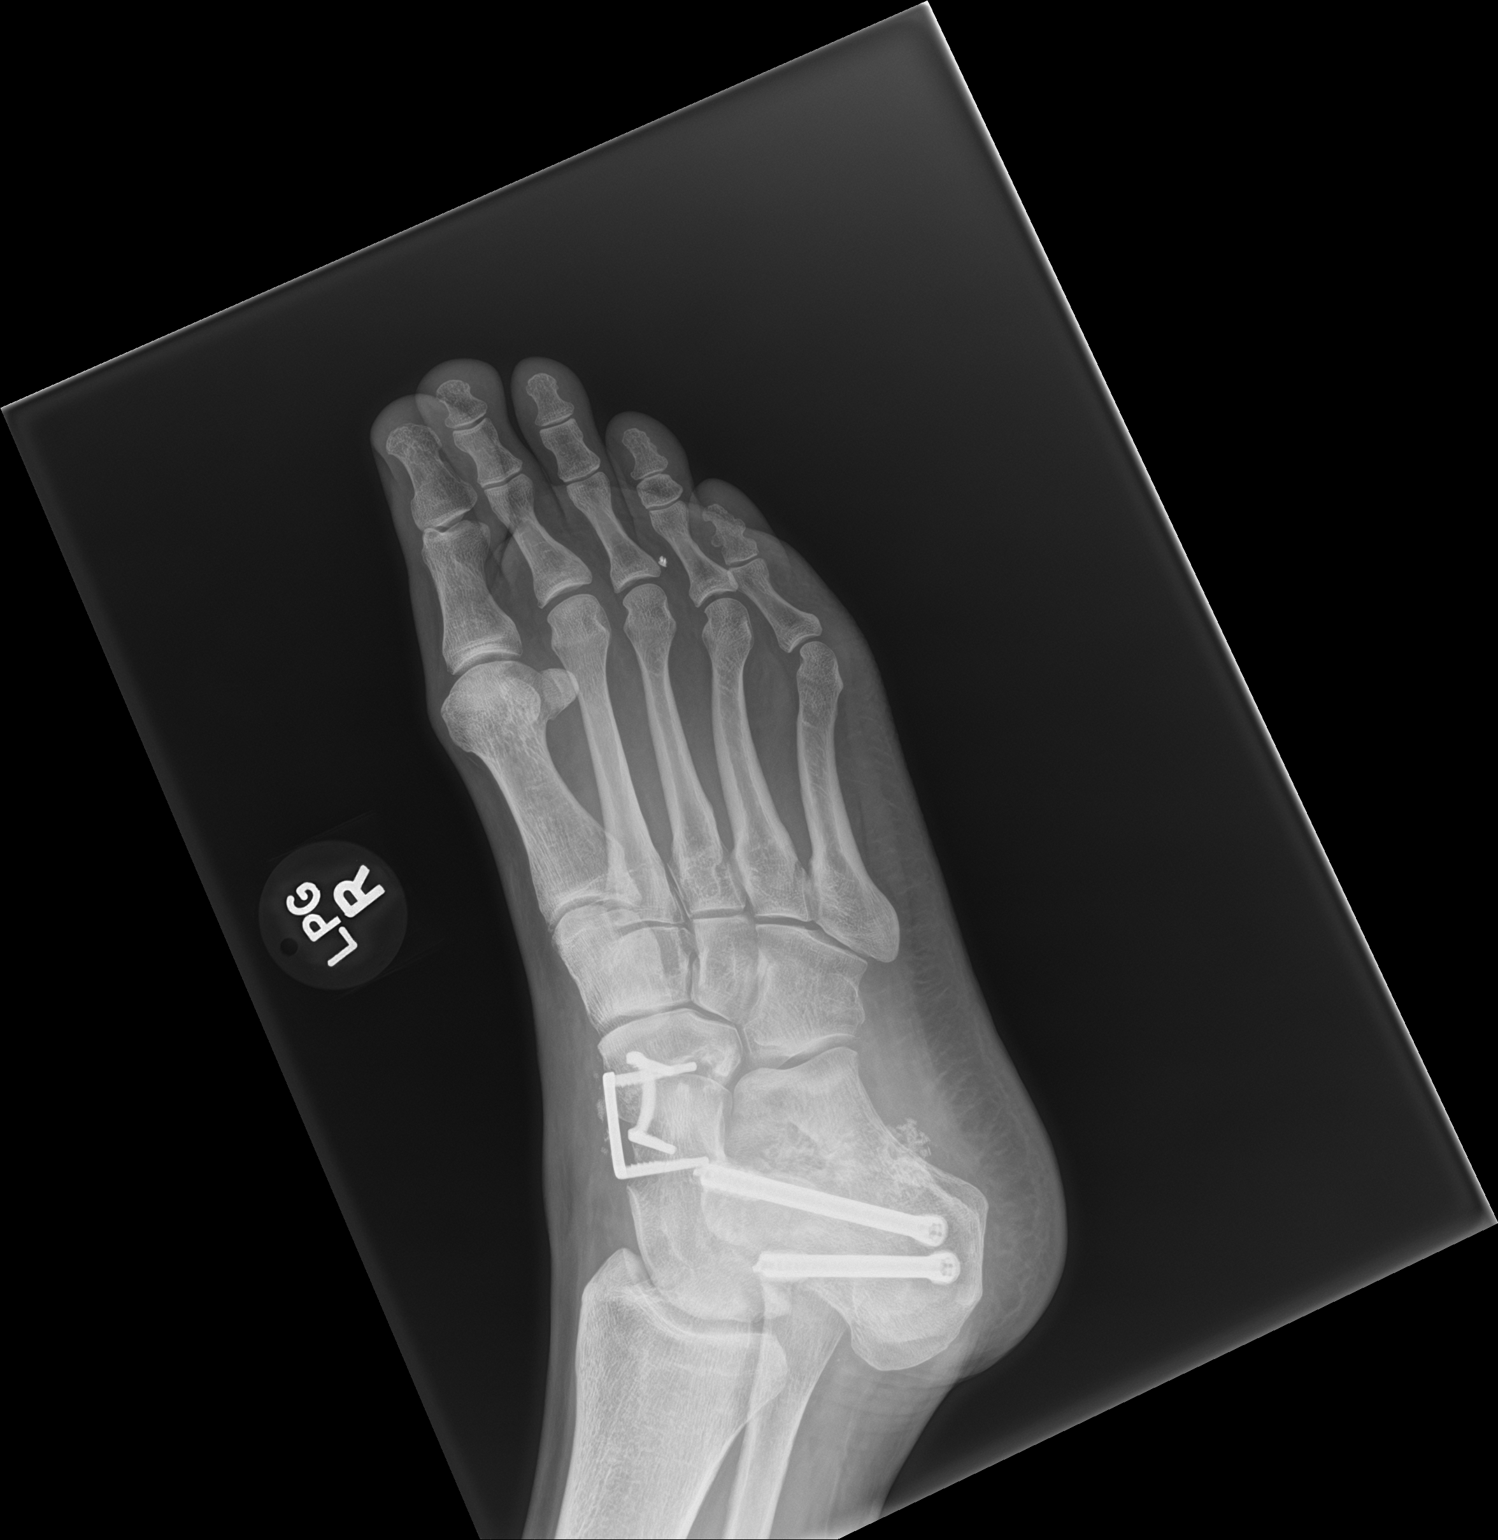

[foot lat]
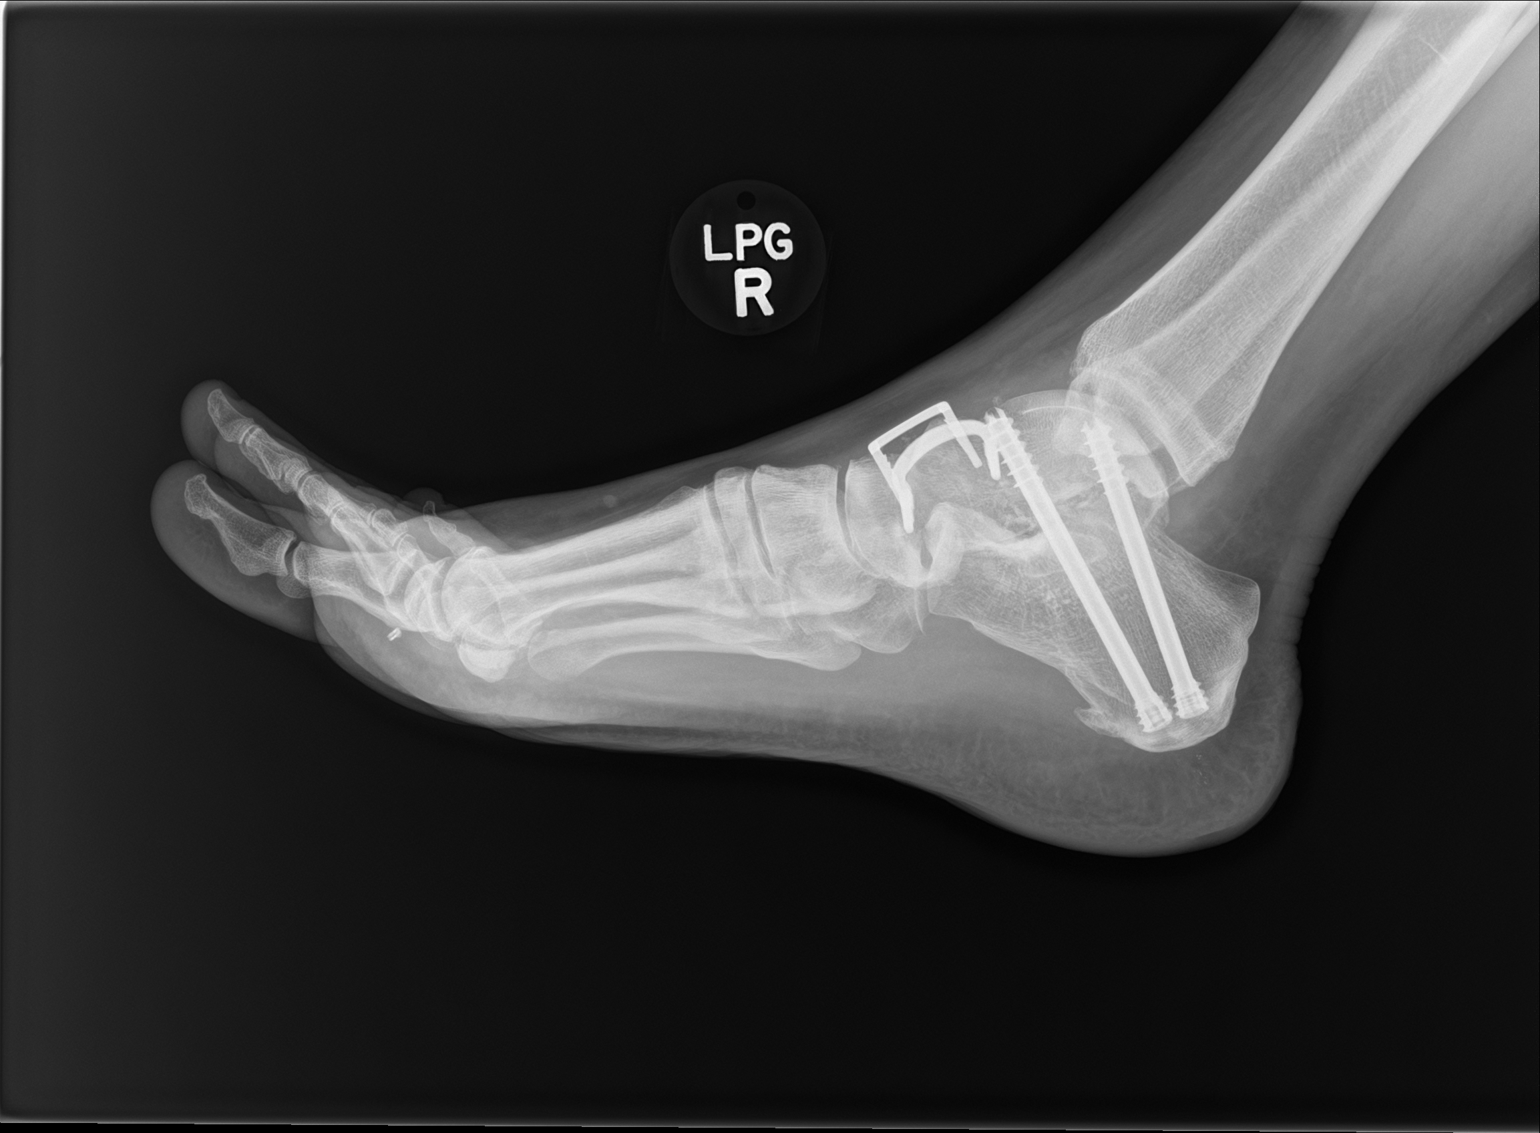

[3 of 3 positions shown; findings below may reference images not displayed]

FINDINGS: Frontal, oblique, lateral views of the right foot are obtained.
Stable postsurgical changes are seen from talonavicular and
talocalcaneal fusion, with stable position of the orthopedic
hardware. Stable soft tissue calcifications lateral aspect of the
hindfoot. 3 mm radiodensity plantar aspect third digit unchanged,
either calcification or radiopaque foreign body. No acute fracture,
subluxation, or dislocation. Joint spaces are well preserved.
IMPRESSION: 1. No acute bony abnormality.
2. Stable postsurgical changes as above.

## 2024-05-16 MED ORDER — DICLOFENAC EPOLAMINE 1.3 % EX PTCH
1.0000 | MEDICATED_PATCH | Freq: Two times a day (BID) | CUTANEOUS | 3 refills | Status: DC
  Filled 2024-05-16: qty 60, 30d supply, fill #0

## 2024-05-16 NOTE — Addendum Note (Signed)
 Addended by: HARDEN LAME on: 05/16/2024 03:35 PM   Modules accepted: Orders

## 2024-05-17 ENCOUNTER — Ambulatory Visit: Admitting: Sports Medicine

## 2024-05-17 ENCOUNTER — Encounter: Payer: Self-pay | Admitting: Sports Medicine

## 2024-05-17 ENCOUNTER — Other Ambulatory Visit (HOSPITAL_COMMUNITY): Payer: Self-pay

## 2024-05-17 ENCOUNTER — Other Ambulatory Visit: Payer: Self-pay

## 2024-05-17 DIAGNOSIS — R2689 Other abnormalities of gait and mobility: Secondary | ICD-10-CM

## 2024-05-17 DIAGNOSIS — Z981 Arthrodesis status: Secondary | ICD-10-CM | POA: Diagnosis not present

## 2024-05-17 DIAGNOSIS — M7660 Achilles tendinitis, unspecified leg: Secondary | ICD-10-CM | POA: Diagnosis not present

## 2024-05-17 DIAGNOSIS — M6788 Other specified disorders of synovium and tendon, other site: Secondary | ICD-10-CM

## 2024-05-17 DIAGNOSIS — M25571 Pain in right ankle and joints of right foot: Secondary | ICD-10-CM

## 2024-05-17 NOTE — Progress Notes (Signed)
   Procedure Note  Patient: Kayla Chaney             Date of Birth: 01-Aug-1976           MRN: [id number]             Visit Date: 05/17/2024  *1st Treatment of extracorporeal shockwave therapy for the right foot/ankle.  Procedures: Visit Diagnoses:  1. Pain in right ankle and joints of right foot   2. Peroneal tendinosis   3. History of fusion of talotibial joint and subtalar joint   4. Achillodynia    Procedure: ECSWT Indications:  Right ankle pain, Peroneal tendinosis, Achillodynia   Procedure Details Consent: Risks of procedure as well as the alternatives and risks of each were explained to the patient.  Verbal consent for procedure obtained. Time Out: Verified patient identification, verified procedure, site was marked, verified correct patient position. The area was cleaned with alcohol swab.     The right peroneal, lateral ankle (ST incision site) was targeted for Extracorporeal shockwave therapy.    Preset: Tendinosis/Scar tissue Power Level: 80-90 mJ Frequency: 10 Hz Impulse/cycles: 2000 Head size: Regular  The right achilles tendon and superior calcaneus was targeted for Extracorporeal shockwave therapy.    Preset: Achillodynia Power Level: 90 mJ Frequency: 10-11 Hz Impulse/cycles: 1800 Head size: Regular   Patient tolerated procedure well without immediate complications.

## 2024-05-17 NOTE — Progress Notes (Signed)
 Kayla Chaney - 48 y.o. female MRN [id number]  Date of birth: 09-29-1975  Office Visit Note: Visit Date: 05/17/2024 PCP: Tobie Suzzane POUR, MD Referred by: Tobie Suzzane POUR, MD  Subjective: Chief Complaint  Patient presents with   Right Ankle - Pain   HPI: Kayla Chaney is a pleasant 48 y.o. female who presents today for evaluation of chronic right foot and ankle pain.  Would also like to trial extracorporeal shockwave therapy.  Has been ramping up her walking, goal of 10-12 K steps per day.  She had an incident weeks ago where she was walking and had a sharp pain and pop near the distal Achilles and heel.  Since this time she had a CT scan and an MRI of the ankle with Dr. Harden.  Her pain continues over the distal Achilles/heel but more so over the lateral ankle near the peroneal tendons which we have treated in the past with shockwave and PRP.  Dr. Harden did order a bone stimulator to help with previous fusion which CT scan showed some evidence of possible fibrous nonunion.  Pertinent ROS were reviewed with the patient and found to be negative unless otherwise specified above in HPI.   Assessment & Plan: Visit Diagnoses:  1. Pain in right ankle and joints of right foot   2. Peroneal tendinosis   3. History of fusion of talotibial joint and subtalar joint   4. Achillodynia   5. Functional gait abnormality    Plan: Impression is acute on chronic right foot and ankle pain with a complicated history of previous talar tubular and subtalar joint fusion.  Once her pain is in the distal Achilles and the lateral aspect of the ankle/foot near the peroneal tendons.  MRI was reassuring as any sort of acute injury or tearing.  Discussed with Hermine that I believe her pain is more biomechanical in nature given her previous fusion as she does walk on the lateral aspect of the foot and has insufficient pushoff given her fusion.  I would like to send her to [facility] for custom orthotics with [employer] to make  modification to help keep her in a neutral gait and to help with the pushoff phase through her walking.  Okay for over-the-counter anti-inflammatories as well.  We did perform extracorporeal shockwave therapy as well, see procedure note above.  Follow-up: Return in about 2 weeks (around 05/31/2024) for R-ankle (reg visit).   Meds & Orders: No orders of the defined types were placed in this encounter.   Orders Placed This Encounter  Procedures   AMB referral to sports medicine     Procedures: No procedures performed      Clinical History: No specialty comments available.  She reports that she has never smoked. She has never used smokeless tobacco.  Recent Labs    03/12/24 0806  HGBA1C 5.0    Objective:    Physical Exam  Gen: Well-appearing, in no acute distress; non-toxic CV: Well-perfused. Warm.  Resp: Breathing unlabored on room air; no wheezing. Psych: Fluid speech in conversation; appropriate affect; normal thought process  Ortho Exam - Right foot/ankle: Well-healed incisions from previous fusion.  There is tenderness at the distal aspect of the Achilles and the peroneal tendons near the lateral ankle.  There is limited mobility with inversion/eversion and plantarflexion given her fusion.  -Gait analysis: Patient does walk more so on the lateral column of the right foot.  There is limited pushoff and plantarflexion going from heel-to-toe pushoff.  Imaging:  CT ANKLE RIGHT WO CONTRAST CLINICAL DATA:  Subtalar arthrodesis. Right ankle pain. Assessment for fusion.  EXAM: CT OF THE RIGHT ANKLE WITHOUT CONTRAST  TECHNIQUE: Multidetector CT imaging of the right ankle was performed according to the standard protocol. Multiplanar CT image reconstructions were also generated.  RADIATION DOSE REDUCTION: This exam was performed according to the departmental dose-optimization program which includes automated exposure control, adjustment of the mA and/or kV according  to patient size and/or use of iterative reconstruction technique.  COMPARISON:  MRI 05/13/2024 and radiographs 11/21/2022  FINDINGS: Bones/Joint/Cartilage  Subtalar screw with small threaded proximal head in the lower calcaneus and larger threaded distal head in the junction of the talar body and neck with lack component traversing the posterior subtalar joint observed without abnormal marginal lucency. There is irregularity and some mild interdigitation along the posterior subtalar facet with loss of articular space but no bony bridging.  U shaped dorsal stables along the talonavicular articulation dorsally. Both of the navicular portions of the staples have components which capture the posterior cortex of the navicular. Additional subcortical cyst formation, loss of articular space, cortical irregularity along the talonavicular articulation without bony bridging.  Degenerative subcortical cystic lesions or geodes along the medial talar dome, lateral talar dome, and anterolateral portion of the tibial plafond. Chondral narrowing anteriorly in the tibiotalar joint.  Tracks from prior screws noted in the posterior calcaneus.  Degenerative subcortical cysts or geodes along the articular surfaces between the middle cuneiform and the base of the second metatarsal.  Ligaments  Suboptimally assessed by CT.  Muscles and Tendons  Faint linear calcification in the distal tibialis posterior tendon.  Plantar fascia mostly excluded.  Soft tissues  Unremarkable  IMPRESSION: 1. Dual threaded subtalar screw with lack of bony bridging across the posterior subtalar joint. 2. U shaped dorsal stables along the talonavicular articulation dorsally. Both of the navicular portions of the staples have components which capture the posterior cortex of the navicular. No bony bridging along the talonavicular articulation. 3. Degenerative subcortical cystic lesions or geodes along the medial  talar dome, lateral talar dome, and anterolateral portion of the tibial plafond. 4. Degenerative subcortical cysts or geodes along the articular surfaces between the middle cuneiform and the base of the second metatarsal. 5. Faint linear calcification in the distal tibialis posterior tendon.  Electronically Signed   By: Ryan Salvage M.D.   On: 05/16/2024 15:13  Narrative & Impression  CLINICAL DATA:  Right heel pain   EXAM: MRI OF THE RIGHT ANKLE WITHOUT CONTRAST   TECHNIQUE: Multiplanar, multisequence MR imaging of the ankle was performed. No intravenous contrast was administered.   COMPARISON:  X-ray 11/21/2022, MRI 10/08/2021   FINDINGS: TENDONS   Peroneal: Peroneus longus and brevis tendons are intact and normally positioned.   Posteromedial: Moderate tendinosis of the distal tibialis posterior tendon. No discernible tear. No tenosynovitis. Flexor digitorum longus and flexor hallucis longus tendons are within normal limits.   Anterior: Tibialis anterior, extensor hallucis longus, and extensor digitorum longus tendons are intact and normally positioned.   Achilles: Intact without tendinosis or tear. No retrocalcaneal bursal fluid.   Plantar Fascia: Intact.   LIGAMENTS   Lateral: Intact tibiofibular ligaments. The anterior and posterior talofibular ligaments are thickened but intact. Intact calcaneofibular ligament.   Medial: The deltoid and visualized portions of the spring ligament appear intact.   CARTILAGE AND BONES   Ankle Joint: Moderate-severe tibiotalar osteoarthritis with high-grade cartilage loss, more pronounced at the lateral shoulder, progressed from  prior. Trace tibiotalar joint effusion.   Subtalar Joints/Sinus Tarsi: Prior subtalar arthrodesis. There is severe degenerative changes across the posterior subtalar joint with reactive bone marrow edema in the calcaneus and talus. No findings to suggest bony fusion across the joint.  Preservation of the fat signal within the sinus tarsi.   Bones: No acute fracture. No malalignment. Degenerative changes of the second tarsometatarsal joint. Dorsal talonavicular joint arthrodesis without evidence of osseous fusion. No suspicious bone lesion.   Other: Subcutaneous edema at the lateral ankle.   IMPRESSION: 1. Prior subtalar arthrodesis. There is severe degenerative changes across the posterior subtalar joint with reactive bone marrow edema in the calcaneus and talus. No findings to suggest bony fusion across the joint. 2. No MR evidence to suggest fusion across the talonavicular joint. 3. Moderate-severe tibiotalar osteoarthritis, progressed from prior. 4. Moderate tendinosis of the distal tibialis posterior tendon.     Electronically Signed   By: Mabel Converse D.O.   On: 05/14/2024 10:17      Past Medical/Family/Surgical/Social History: Medications & Allergies reviewed per EMR, new medications updated. Patient Active Problem List   Diagnosis Date Noted   Peroneal tendinosis 02/27/2024   Overweight (BMI 25.0-29.9) 08/25/2023   Impingement syndrome of right ankle 07/20/2022   Pain from implanted hardware 07/20/2022   Encounter for annual general medical examination with abnormal findings in adult 07/13/2022   Posterior tibialis tendon insufficiency    Palpitations 07/09/2021   Hot flashes due to menopause 07/09/2021   IUD (intrauterine device) in place 07/09/2021   Hyperlipemia 07/09/2021   Insomnia 11/13/2013   Degenerative joint disease of cervical spine 11/13/2013   Past Medical History:  Diagnosis Date   Complication of anesthesia    COVID 08/2020   mild case   Hot flashes due to menopause 07/09/2021   Hyperlipidemia    IUD (intrauterine device) in place 07/09/2021   PONV (postoperative nausea and vomiting)    just nausea   Family History  Problem Relation Age of Onset   Stroke Mother    Hypertension Father    Diabetes Father    Brain  cancer Sister    Stroke Maternal Grandmother    Past Surgical History:  Procedure Laterality Date   ANKLE ARTHROSCOPY Right 07/20/2022   Procedure: RIGHT ANKLE ARTHROSCOPY;  Surgeon: Harden Jerona GAILS, MD;  Location: [facility] OR;  Service: Orthopedics;  Laterality: Right;   CESAREAN SECTION  2003, 2008   COLONOSCOPY WITH PROPOFOL  N/A 11/20/2020   Procedure: COLONOSCOPY WITH PROPOFOL ;  Surgeon: Cindie Carlin POUR, DO;  Location: [facility];  Service: Endoscopy;  Laterality: N/A;  am/ASA II, pt tested +1/24 thru [employer] [facility] <90 - results emailed to [place] - advised pt current arrival time of 7:00 - needed to know for transportation cy 3/21   DILITATION & CURRETTAGE/HYSTROSCOPY WITH ESSURE N/A 2008   Retained placenta from c-section birth   FOOT ARTHRODESIS Right 10/27/2021   Procedure: RIGHT SUBTALAR AND TALONAVICULAR FUSION;  Surgeon: Harden Jerona GAILS, MD;  Location: [facility] OR;  Service: Orthopedics;  Laterality: Right;   HARDWARE REMOVAL Right 07/20/2022   Procedure: REMOVAL SCREW RIGHT ANKLE;  Surgeon: Harden Jerona GAILS, MD;  Location: [facility] OR;  Service: Orthopedics;  Laterality: Right;   POLYPECTOMY  11/20/2020   Procedure: POLYPECTOMY;  Surgeon: Cindie Carlin POUR, DO;  Location: [facility];  Service: Endoscopy;;   Social History   Occupational History    Comment: XRAY tech  Tobacco Use   Smoking status: Never  Smokeless tobacco: Never  Vaping Use   Vaping status: Never Used  Substance and Sexual Activity   Alcohol use: Yes    Comment: occ   Drug use: No   Sexual activity: Yes    Birth control/protection: I.U.D.

## 2024-05-19 ENCOUNTER — Encounter: Payer: Self-pay | Admitting: Internal Medicine

## 2024-05-20 ENCOUNTER — Other Ambulatory Visit (HOSPITAL_BASED_OUTPATIENT_CLINIC_OR_DEPARTMENT_OTHER): Payer: Self-pay

## 2024-05-20 ENCOUNTER — Encounter: Payer: Self-pay | Admitting: Orthopedic Surgery

## 2024-05-20 ENCOUNTER — Other Ambulatory Visit: Payer: Self-pay | Admitting: Orthopedic Surgery

## 2024-05-20 ENCOUNTER — Other Ambulatory Visit (HOSPITAL_COMMUNITY): Payer: Self-pay

## 2024-05-20 ENCOUNTER — Encounter: Payer: Self-pay | Admitting: Pharmacist

## 2024-05-20 ENCOUNTER — Other Ambulatory Visit: Payer: Self-pay

## 2024-05-20 DIAGNOSIS — Z981 Arthrodesis status: Secondary | ICD-10-CM

## 2024-05-20 DIAGNOSIS — M19132 Post-traumatic osteoarthritis, left wrist: Secondary | ICD-10-CM

## 2024-05-20 DIAGNOSIS — M25571 Pain in right ankle and joints of right foot: Secondary | ICD-10-CM

## 2024-05-20 DIAGNOSIS — M76829 Posterior tibial tendinitis, unspecified leg: Secondary | ICD-10-CM

## 2024-05-20 DIAGNOSIS — M6788 Other specified disorders of synovium and tendon, other site: Secondary | ICD-10-CM

## 2024-05-21 ENCOUNTER — Other Ambulatory Visit: Payer: Self-pay

## 2024-05-21 NOTE — Progress Notes (Addendum)
 Office Visit Note  Patient: Kayla Chaney             Date of Birth: 28-Sep-1975           MRN: [id number]             PCP: Tobie Suzzane POUR, MD Referring: Harden Jerona GAILS, MD Visit Date: 05/28/2024 Occupation: Data Unavailable  Subjective:  Right ankle pain  History of Present Illness: Kayla Chaney is a 48 y.o. female seen for the evaluation of nonunion of and talonavicular joint fusion.  According to me her symptoms started about 3 years ago while she was in a Zumba class.  She had sudden pop in her ankle.  She was evaluated by Dr. Harden and after MRI she was diagnosed with right posterior tibial tendon rupture.  Dr. Harden recommended subtalar and talonavicular fusion.  She states it took about 4 months for it to heal.  During the duration she had to be on prednisone  10 mg by mouth for about 6 weeks due to ongoing pain.  She was also placed on gabapentin  due to ongoing pain.  The pain persisted and she was evaluated by Dr. Burnetta who tried shockwave therapy and it did not work.  She started having increased pain in the Achilles tendon and in the peroneal tendon region.  She also had PRP injections by Dr. Burnetta without much help.  She continued to walk despite discomfort.  About 3 weeks ago she went for a walk and felt a sudden pop in her ankle again with severe pain.  She was seen by Dr. Harden who did MRI which showed nonunion of talonavicular and subtalar.  She also had repeat CT scan which confirmed the nonunion.  She has been using bone stim unit which has not been helpful.  There is question about low BMD. None of the other joints are painful.  She states she has IUD but has been postmenopausal for 1 year.  She wants to have a DEXA scan to evaluate her bone mass density due to nonunion of her fracture.  She has had long-term use of systemic steroids in the past and also had long-term use of gabapentin  for the pain management.  There is no history of taking long-term thyroid  medications or  antacids.  She has never taken antiepileptics in the past. There is family history of osteoporosis in her mother and rheumatoid arthritis in her maternal aunt. 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  She is married, gravida 2, para 2.  There is no history of preeclampsia or DVTs.  She has never been a smoker.  She drinks alcohol rarely.  She used to enjoy Zumba, walking which she is having difficulty doing now.  She is trying to get a bike to exercise.    Activities of Daily Living:  Patient reports morning stiffness for 0 minute.   Patient Denies nocturnal pain.  Difficulty dressing/grooming: Denies Difficulty climbing stairs: Reports Difficulty getting out of chair: Denies Difficulty using hands for taps, buttons, cutlery, and/or writing: Denies  Review of Systems  Constitutional:  Negative for fatigue.  HENT:  Negative for mouth sores and mouth dryness.   Eyes:  Negative for dryness.  Respiratory:  Negative for shortness of breath.   Cardiovascular:  Positive for palpitations. Negative for chest pain.  Gastrointestinal:  Negative for blood in stool, constipation and diarrhea.  Endocrine: Negative for increased urination.  Genitourinary:  Negative for involuntary urination.  Musculoskeletal:  Positive for joint pain and joint pain. Negative for gait problem, joint swelling, myalgias, muscle weakness, morning stiffness, muscle tenderness and myalgias.  Skin:  Negative for color change, rash, hair loss and sensitivity to sunlight.  Allergic/Immunologic: Negative for susceptible to infections.  Neurological:  Negative for dizziness and headaches.  Hematological:  Negative for swollen glands.  Psychiatric/Behavioral:  Positive for sleep disturbance. Negative for depressed mood. The patient is not nervous/anxious.     PMFS History:  Patient Active Problem List   Diagnosis Date Noted   Peroneal tendinosis 02/27/2024   Overweight (BMI 25.0-29.9) 08/25/2023    Impingement syndrome of right ankle 07/20/2022   Pain from implanted hardware 07/20/2022   Encounter for annual general medical examination with abnormal findings in adult 07/13/2022   Posterior tibialis tendon insufficiency    Palpitations 07/09/2021   Hot flashes due to menopause 07/09/2021   IUD (intrauterine device) in place 07/09/2021   Hyperlipemia 07/09/2021   Insomnia 11/13/2013   Degenerative joint disease of cervical spine 11/13/2013    Past Medical History:  Diagnosis Date   Complication of anesthesia    COVID 08/2020   mild case   Effusion into joint    right foot   Hot flashes due to menopause 07/09/2021   Hyperlipidemia    IUD (intrauterine device) in place 07/09/2021   PONV (postoperative nausea and vomiting)    just nausea    Family History  Problem Relation Age of Onset   Stroke Mother    Hypertension Father    Diabetes Father    Brain cancer Sister    Stroke Maternal Grandmother    Past Surgical History:  Procedure Laterality Date   ANKLE ARTHROSCOPY Right 07/20/2022   Procedure: RIGHT ANKLE ARTHROSCOPY;  Surgeon: Harden Jerona GAILS, MD;  Location: [facility] OR;  Service: Orthopedics;  Laterality: Right;   CESAREAN SECTION  2003, 2008   COLONOSCOPY WITH PROPOFOL  N/A 11/20/2020   Procedure: COLONOSCOPY WITH PROPOFOL ;  Surgeon: Cindie Carlin POUR, DO;  [facility];  Service: Endoscopy;  Laterality: N/A;  am/ASA II, pt tested +1/24 thru Health at work <90 - results emailed to [place] - advised pt current arrival time of 7:00 - needed to know for transportation cy 3/21   DILITATION & CURRETTAGE/HYSTROSCOPY WITH ESSURE N/A 2008   Retained placenta from c-section birth   FOOT ARTHRODESIS Right 10/27/2021   Procedure: RIGHT SUBTALAR AND TALONAVICULAR FUSION;  Surgeon: Harden Jerona GAILS, MD;  Location: [facility] OR;  Service: Orthopedics;  Laterality: Right;   HARDWARE REMOVAL Right 07/20/2022   Procedure: REMOVAL SCREW RIGHT ANKLE;  Surgeon: Harden Jerona GAILS, MD;  Location:  [facility] OR;  Service: Orthopedics;  Laterality: Right;   POLYPECTOMY  11/20/2020   Procedure: POLYPECTOMY;  Surgeon: Cindie Carlin POUR, DO;  [facility]: [facility];  Service: Endoscopy;;   Social History   Tobacco Use   Smoking status: Never    Passive exposure: Past   Smokeless tobacco: Never  Vaping Use   Vaping status: Never Used  Substance Use Topics   Alcohol use: Yes    Comment: occ   Drug use: No   Social History   Social History Narrative   Not on file     Immunization History  Administered Date(s) Administered   Influenza Split 06/22/2023   Influenza,inj,Quad PF,6+ Mos 05/27/2019, 06/04/2021, 06/16/2022   Influenza-Unspecified 05/23/2014, 05/29/2016, 06/04/2020   Moderna SARS-COV2 Booster Vaccination 07/17/2020   Moderna Sars-Covid-2 Vaccination 09/03/2019, 10/04/2019   Tdap 11/10/2016  Objective: Vital Signs: BP 132/85 (BP Location: Right Arm, Patient Position: Sitting, Cuff Size: Normal)   Pulse (!) 101   Temp 98.2 F (36.8 C)   Resp 14   Ht 5' 5.5 (1.664 m)   Wt 184 lb 12.8 oz (83.8 kg)   BMI 30.28 kg/m    Physical Exam Vitals and nursing note reviewed.  Constitutional:      Appearance: She is well-developed.  HENT:     Head: Normocephalic and atraumatic.  Eyes:     Conjunctiva/sclera: Conjunctivae normal.  Cardiovascular:     Rate and Rhythm: Normal rate and regular rhythm.     Heart sounds: Normal heart sounds.  Pulmonary:     Effort: Pulmonary effort is normal.     Breath sounds: Normal breath sounds.  Abdominal:     General: Bowel sounds are normal.     Palpations: Abdomen is soft.  Musculoskeletal:     Cervical back: Normal range of motion.  Lymphadenopathy:     Cervical: No cervical adenopathy.  Skin:    General: Skin is warm and dry.     Capillary Refill: Capillary refill takes less than 2 seconds.  Neurological:     Mental Status: She is alert and oriented to person, place, and time.  Psychiatric:        Behavior: Behavior  normal.      Musculoskeletal Exam: Cervical, thoracic and lumbar spine were in good range of motion.  There was no SI joint tenderness.  Shoulder joints, elbow joints, wrist joints, MCPs, PIPs and DIPs were in good range of motion with no synovitis.  Hip joints and knee joints were in good range of motion without any warmth swelling or effusion.  She has some limitation with range of motion of her right ankle joint.  She had tenderness over her right peroneal tendon and subtalar region.  No swelling was noted.   CDAI Exam: CDAI Score: -- Patient Global: --; Provider Global: -- Swollen: --; Tender: -- Joint Exam 05/28/2024   No joint exam has been documented for this visit   There is currently no information documented on the homunculus. Go to the Rheumatology activity and complete the homunculus joint exam.  Investigation: No additional findings.  Imaging: CT ANKLE RIGHT WO CONTRAST Result Date: 05/16/2024 CLINICAL DATA:  Subtalar arthrodesis. Right ankle pain. Assessment for fusion. EXAM: CT OF THE RIGHT ANKLE WITHOUT CONTRAST TECHNIQUE: Multidetector CT imaging of the right ankle was performed according to the standard protocol. Multiplanar CT image reconstructions were also generated. RADIATION DOSE REDUCTION: This exam was performed according to the departmental dose-optimization program which includes automated exposure control, adjustment of the mA and/or kV according to patient size and/or use of iterative reconstruction technique. COMPARISON:  MRI 05/13/2024 and radiographs 11/21/2022 FINDINGS: Bones/Joint/Cartilage Subtalar screw with small threaded proximal head in the lower calcaneus and larger threaded distal head in the junction of the talar body and neck with lack component traversing the posterior subtalar joint observed without abnormal marginal lucency. There is irregularity and some mild interdigitation along the posterior subtalar facet with loss of articular space but no bony  bridging. U shaped dorsal stables along the talonavicular articulation dorsally. Both of the navicular portions of the staples have components which capture the posterior cortex of the navicular. Additional subcortical cyst formation, loss of articular space, cortical irregularity along the talonavicular articulation without bony bridging. Degenerative subcortical cystic lesions or geodes along the medial talar dome, lateral talar dome, and anterolateral portion of the  tibial plafond. Chondral narrowing anteriorly in the tibiotalar joint. Tracks from prior screws noted in the posterior calcaneus. Degenerative subcortical cysts or geodes along the articular surfaces between the middle cuneiform and the base of the second metatarsal. Ligaments Suboptimally assessed by CT. Muscles and Tendons Faint linear calcification in the distal tibialis posterior tendon. Plantar fascia mostly excluded. Soft tissues Unremarkable IMPRESSION: 1. Dual threaded subtalar screw with lack of bony bridging across the posterior subtalar joint. 2. U shaped dorsal stables along the talonavicular articulation dorsally. Both of the navicular portions of the staples have components which capture the posterior cortex of the navicular. No bony bridging along the talonavicular articulation. 3. Degenerative subcortical cystic lesions or geodes along the medial talar dome, lateral talar dome, and anterolateral portion of the tibial plafond. 4. Degenerative subcortical cysts or geodes along the articular surfaces between the middle cuneiform and the base of the second metatarsal. 5. Faint linear calcification in the distal tibialis posterior tendon. Electronically Signed   By: Ryan Salvage M.D.   On: 05/16/2024 15:13   MR Ankle Right w/o contrast Result Date: 05/14/2024 CLINICAL DATA:  Right heel pain EXAM: MRI OF THE RIGHT ANKLE WITHOUT CONTRAST TECHNIQUE: Multiplanar, multisequence MR imaging of the ankle was performed. No intravenous  contrast was administered. COMPARISON:  X-ray 11/21/2022, MRI 10/08/2021 FINDINGS: TENDONS Peroneal: Peroneus longus and brevis tendons are intact and normally positioned. Posteromedial: Moderate tendinosis of the distal tibialis posterior tendon. No discernible tear. No tenosynovitis. Flexor digitorum longus and flexor hallucis longus tendons are within normal limits. Anterior: Tibialis anterior, extensor hallucis longus, and extensor digitorum longus tendons are intact and normally positioned. Achilles: Intact without tendinosis or tear. No retrocalcaneal bursal fluid. Plantar Fascia: Intact. LIGAMENTS Lateral: Intact tibiofibular ligaments. The anterior and posterior talofibular ligaments are thickened but intact. Intact calcaneofibular ligament. Medial: The deltoid and visualized portions of the spring ligament appear intact. CARTILAGE AND BONES Ankle Joint: Moderate-severe tibiotalar osteoarthritis with high-grade cartilage loss, more pronounced at the lateral shoulder, progressed from prior. Trace tibiotalar joint effusion. Subtalar Joints/Sinus Tarsi: Prior subtalar arthrodesis. There is severe degenerative changes across the posterior subtalar joint with reactive bone marrow edema in the calcaneus and talus. No findings to suggest bony fusion across the joint. Preservation of the fat signal within the sinus tarsi. Bones: No acute fracture. No malalignment. Degenerative changes of the second tarsometatarsal joint. Dorsal talonavicular joint arthrodesis without evidence of osseous fusion. No suspicious bone lesion. Other: Subcutaneous edema at the lateral ankle. IMPRESSION: 1. Prior subtalar arthrodesis. There is severe degenerative changes across the posterior subtalar joint with reactive bone marrow edema in the calcaneus and talus. No findings to suggest bony fusion across the joint. 2. No MR evidence to suggest fusion across the talonavicular joint. 3. Moderate-severe tibiotalar osteoarthritis, progressed  from prior. 4. Moderate tendinosis of the distal tibialis posterior tendon. Electronically Signed   By: Mabel Converse D.O.   On: 05/14/2024 10:17    Recent Labs: Lab Results  Component Value Date   WBC 6.8 03/12/2024   HGB 13.2 03/12/2024   PLT 219 03/12/2024   NA 136 03/12/2024   K 4.0 03/12/2024   CL 102 03/12/2024   CO2 25 03/12/2024   GLUCOSE 77 03/12/2024   BUN 13 03/12/2024   CREATININE 0.59 03/12/2024   BILITOT 0.4 03/12/2024   ALKPHOS 72 08/26/2022   AST 19 03/12/2024   ALT 19 03/12/2024   PROT 6.5 03/12/2024   ALBUMIN 4.5 08/26/2022   CALCIUM  9.2 03/12/2024  GFRAA 117 07/03/2020   March 12, 2024 vitamin D  31, TSH 2.13, CMP normal, CBC normal  Speciality Comments: No specialty comments available.  Procedures:  No procedures performed Allergies: Patient has no known allergies.   Assessment / Plan:     Visit Diagnoses: Chronic pain of right ankle-patient reports injury followed by rupture to the right posterior tibial tendon in 2022.  She underwent foot arthrodesis on October 27, 2021 and ankle arthroscopic surgery on July 20, 2022 to remove the hardware.  Patient states she continued to have pain and discomfort despite having the surgery.  She had shockwave therapy and PRP injections without much help.  3 weeks ago she heard sudden pop in her ankle and had repeat MRI followed by CT scan which confirmed nonunion of talonavicular and subtalar region.  It also showed moderate to severe tibiotalar osteoarthritis which progressed from the previous studies.  Moderate tendinosis of the distal tibial posterior Istin was noted on the MRI which I reviewed personally.  Patient's concern is that she took prednisone  for several weeks after the surgery and has been on gabapentin .  She is concerned about nonhealing and low BMD.  There is strong family history of osteoporosis.  Insufficiency of right posterior tibial tendon  Peroneal tendinosis-persists.  Pain from implanted  hardware, sequela-hardware was removed in November 2023.  Failure joint fusion D89.9X XD-schedule DEXA scan  Spondylosis of cervical region without myelopathy or radiculopathy-she good range of motion without discomfort.  Osteoporosis screening-patient states she has been 1 year postmenopausal and has been experiencing hot flashes.  Long term (current) use of systemic steroids-she took prednisone  for several weeks after the surgery.  She has also been on gabapentin .  Postmenopausal-she states that she is 1 year postmenopausal and also has postmenopausal hot flashes.  Vitamin D  deficiency -I will obtain following labs today.  Plan: Parathyroid hormone, intact (no Ca), VITAMIN D  25 Hydroxy (Vit-D Deficiency, Fractures), Phosphorus.  Vitamin D  was 31 on March 12, 2024.  Patient states she recently started taking vitamin D  5000 units daily.  Will recheck vitamin D  level today.  Family history of osteoporosis in mother-there is no history of femur fracture in her mother.  Other fatigue -she gives history of increased fatigue.  Plan: Serum protein electrophoresis with reflex, Comprehensive metabolic panel with GFR  Hot flashes due to menopause  Other medical problems are listed as follows:  IUD (intrauterine device) in place  Palpitations  Other insomnia  Mixed hyperlipidemia  Family history of rheumatoid arthritis  Orders: Orders Placed This Encounter  Procedures   Parathyroid hormone, intact (no Ca)   VITAMIN D  25 Hydroxy (Vit-D Deficiency, Fractures)   Phosphorus   Serum protein electrophoresis with reflex   Comprehensive metabolic panel with GFR   No orders of the defined types were placed in this encounter.    Follow-Up Instructions: Return for Osteoporosis screening.   Maya Nash, MD  Note - This record has been created using Animal nutritionist.  Chart creation errors have been sought, but may not always  have been located. Such creation errors do not reflect on   the standard of medical care.

## 2024-05-23 ENCOUNTER — Ambulatory Visit (INDEPENDENT_AMBULATORY_CARE_PROVIDER_SITE_OTHER)

## 2024-05-23 ENCOUNTER — Encounter: Payer: Self-pay | Admitting: Orthopedic Surgery

## 2024-05-23 VITALS — BP 118/82 | Ht 65.5 in | Wt 182.0 lb

## 2024-05-23 DIAGNOSIS — Z981 Arthrodesis status: Secondary | ICD-10-CM

## 2024-05-23 DIAGNOSIS — M25571 Pain in right ankle and joints of right foot: Secondary | ICD-10-CM | POA: Diagnosis not present

## 2024-05-23 DIAGNOSIS — M6788 Other specified disorders of synovium and tendon, other site: Secondary | ICD-10-CM

## 2024-05-23 NOTE — Progress Notes (Cosign Needed)
 PCP: Tobie Suzzane POUR, MD  Subjective:   HPI: Patient is a 48 y.o. female here after being referred to us  for fitting of custom orthotics.  Patient follows with Dr. Burnetta and has long history of chronic right foot and ankle pain.  Status post fusion of the tibiotalar joint and subtalar joint.  Still having pain and recently MRI showed partial nonunion.  She also has been recently diagnosed with peroneal tendinosis.  She has worn orthotics for many years but has been a couple years since she had custom orthotics.  Currently wearing over-the-counter orthotics from [employer].  Works as an Publishing rights manager and says the pain in her right foot and ankle gets worse throughout the day after prolonged standing and walking.  Past Medical History:  Diagnosis Date   Complication of anesthesia    COVID 08/2020   mild case   Hot flashes due to menopause 07/09/2021   Hyperlipidemia    IUD (intrauterine device) in place 07/09/2021   PONV (postoperative nausea and vomiting)    just nausea    Current Outpatient Medications on File Prior to Visit  Medication Sig Dispense Refill   amitriptyline  (ELAVIL ) 25 MG tablet Take 3 tablets (75 mg total) by mouth at bedtime. 90 tablet 3   Calcium  Carbonate (CALCIUM  500 PO) Take 500 mg by mouth daily.     cholecalciferol (VITAMIN D3) 25 MCG (1000 UNIT) tablet Take 1,000 Units by mouth daily.     diclofenac  (FLECTOR ) 1.3 % PTCH Place 1 patch onto the skin 2 (two) times daily. 60 patch 3   estradiol  (ESTRACE ) 0.5 MG tablet Take 1 tablet (0.5 mg total) by mouth daily. 90 tablet 4   Evolocumab  (REPATHA ) 140 MG/ML SOSY Inject 140 mg into the skin every 14 (fourteen) days. 6 mL 0   fenofibrate  (TRICOR ) 145 MG tablet Take 1 tablet (145 mg total) by mouth daily. 90 tablet 3   gabapentin  (NEURONTIN ) 300 MG capsule Take 1 capsule (300 mg total) by mouth 3 (three) times daily when necessary for neuropathy pain 90 capsule 3   hydrOXYzine  (ATARAX ) 10 MG tablet Take 2 tablets (20 mg total)  by mouth at bedtime. 180 tablet 3   levonorgestrel  (MIRENA ) 20 MCG/24HR IUD 1 each by Intrauterine route once.     methocarbamol  (ROBAXIN ) 500 MG tablet Take 1 tablet (500 mg total) by mouth every 8 (eight) hours as needed for muscle spasms. 90 tablet 3   nabumetone  (RELAFEN ) 750 MG tablet Take 1 tablet (750 mg total) by mouth 2 (two) times daily as needed for mild pain or moderate pain. with food 60 tablet 3   valACYclovir  (VALTREX ) 1000 MG tablet Take 2 tablets (2,000 mg total) by mouth at the start of symptoms and then 2 tablets (2,000 mg) 12 hours later. 40 tablet 1   [DISCONTINUED] rosuvastatin  (CRESTOR ) 5 MG tablet Take 1 tablet (5 mg total) by mouth daily. (Patient not taking: Reported on 09/04/2020) 90 tablet 3   No current facility-administered medications on file prior to visit.    Past Surgical History:  Procedure Laterality Date   ANKLE ARTHROSCOPY Right 07/20/2022   Procedure: RIGHT ANKLE ARTHROSCOPY;  Surgeon: Harden Jerona GAILS, MD;  Location: [facility] OR;  Service: Orthopedics;  Laterality: Right;   CESAREAN SECTION  2003, 2008   COLONOSCOPY WITH PROPOFOL  N/A 11/20/2020   Procedure: COLONOSCOPY WITH PROPOFOL ;  Surgeon: Cindie Carlin POUR, DO;  [facility];  Service: Endoscopy;  Laterality: N/A;  am/ASA II, pt tested +1/24 thru  Health at work <90 - results emailed to [place] - advised pt current arrival time of 7:00 - needed to know for transportation cy 3/21   DILITATION & CURRETTAGE/HYSTROSCOPY WITH ESSURE N/A 2008   Retained placenta from c-section birth   FOOT ARTHRODESIS Right 10/27/2021   Procedure: RIGHT SUBTALAR AND TALONAVICULAR FUSION;  Surgeon: Harden Jerona GAILS, MD;  Location: [facility] OR;  Service: Orthopedics;  Laterality: Right;   HARDWARE REMOVAL Right 07/20/2022   Procedure: REMOVAL SCREW RIGHT ANKLE;  Surgeon: Harden Jerona GAILS, MD;  Location: [facility] OR;  Service: Orthopedics;  Laterality: Right;   POLYPECTOMY  11/20/2020   Procedure: POLYPECTOMY;  Surgeon: Cindie Carlin POUR,  DO;  [facility]: [facility];  Service: Endoscopy;;    No Known Allergies  BP 118/82   Ht 5' 5.5 (1.664 m)   Wt 182 lb (82.6 kg)   BMI 29.83 kg/m       No data to display              No data to display              Objective:  Physical Exam:  Gen: NAD, comfortable in exam room  On inspection of bilateral feet patient has no obvious ecchymosis, erythema or edema present.  Patient has mild/moderate hallux valgus of bilateral great toes.  Early stages of formation of mild bunion bilaterally.  Patient has minimal tenderness to palpation over bony landmarks, mild TTP over right calcaneus.  On standing assessment patient has development of mild pes planus.  Ambulation assessment reveals limited right ankle mobility s/p fusion surgery as well as end-stage pronation of bilateral feet, left worse than right.   Assessment & Plan:   #Fitting for custom orthotics #Right peroneal tendinosis #Pain in right foot and ankle Patient was fitted for a : [employer] semi-rigid orthotic  The orthotic was heated, placed on the orthotic stand. The patient was positioned in subtalar neutral position and 10 degrees of ankle dorsiflexion in a weight bearing stance on the heated orthotic blank After completion of molding Blank: [employer]n Run - size 9 Posting:  none Base: none On ambulation assessment after fitting of custom orthotics she has decreased end-stage pronation of bilateral feet.  She also endorses increased comfort with standing and ambulation.  She still has some mild overpronation of left foot even with custom orthotics.  Discussed possible need for addition of small scaphoid pad in future, will first try custom orthotics as it is.  Patient understands and agrees to treatment plan.  No further questions at this time.  Will return to our clinic or Dr. Burnetta if additional scaphoid pad needed on top of custom orthotics.

## 2024-05-28 ENCOUNTER — Encounter: Payer: Self-pay | Admitting: Rheumatology

## 2024-05-28 ENCOUNTER — Ambulatory Visit: Attending: Rheumatology | Admitting: Rheumatology

## 2024-05-28 VITALS — BP 132/85 | HR 101 | Temp 98.2°F | Resp 14 | Ht 65.5 in | Wt 184.8 lb

## 2024-05-28 DIAGNOSIS — Z7952 Long term (current) use of systemic steroids: Secondary | ICD-10-CM | POA: Diagnosis not present

## 2024-05-28 DIAGNOSIS — E559 Vitamin D deficiency, unspecified: Secondary | ICD-10-CM

## 2024-05-28 DIAGNOSIS — M6788 Other specified disorders of synovium and tendon, other site: Secondary | ICD-10-CM

## 2024-05-28 DIAGNOSIS — T849XXS Unspecified complication of internal orthopedic prosthetic device, implant and graft, sequela: Secondary | ICD-10-CM

## 2024-05-28 DIAGNOSIS — E782 Mixed hyperlipidemia: Secondary | ICD-10-CM

## 2024-05-28 DIAGNOSIS — M25571 Pain in right ankle and joints of right foot: Secondary | ICD-10-CM

## 2024-05-28 DIAGNOSIS — T85848S Pain due to other internal prosthetic devices, implants and grafts, sequela: Secondary | ICD-10-CM

## 2024-05-28 DIAGNOSIS — M47812 Spondylosis without myelopathy or radiculopathy, cervical region: Secondary | ICD-10-CM | POA: Diagnosis not present

## 2024-05-28 DIAGNOSIS — G4709 Other insomnia: Secondary | ICD-10-CM

## 2024-05-28 DIAGNOSIS — Z8262 Family history of osteoporosis: Secondary | ICD-10-CM | POA: Diagnosis not present

## 2024-05-28 DIAGNOSIS — Z780 Asymptomatic menopausal state: Secondary | ICD-10-CM

## 2024-05-28 DIAGNOSIS — G8929 Other chronic pain: Secondary | ICD-10-CM

## 2024-05-28 DIAGNOSIS — M76821 Posterior tibial tendinitis, right leg: Secondary | ICD-10-CM | POA: Diagnosis not present

## 2024-05-28 DIAGNOSIS — Z975 Presence of (intrauterine) contraceptive device: Secondary | ICD-10-CM

## 2024-05-28 DIAGNOSIS — Z13820 Encounter for screening for osteoporosis: Secondary | ICD-10-CM | POA: Diagnosis not present

## 2024-05-28 DIAGNOSIS — N951 Menopausal and female climacteric states: Secondary | ICD-10-CM

## 2024-05-28 DIAGNOSIS — R5383 Other fatigue: Secondary | ICD-10-CM

## 2024-05-28 DIAGNOSIS — Z8261 Family history of arthritis: Secondary | ICD-10-CM

## 2024-05-28 DIAGNOSIS — R002 Palpitations: Secondary | ICD-10-CM

## 2024-05-29 ENCOUNTER — Ambulatory Visit: Payer: Self-pay | Admitting: Rheumatology

## 2024-05-29 DIAGNOSIS — R899 Unspecified abnormal finding in specimens from other organs, systems and tissues: Secondary | ICD-10-CM

## 2024-05-29 NOTE — Progress Notes (Signed)
 PTH is low at 13.  Vitamin D  normal, phosphorus normal.  SPEP pending.  I would like for her to be evaluated for low PTH level by an endocrinologist.  Please ask patient where would she like to be referred.  CMP shows mildly elevated LFTs.  She should avoid all NSAIDs and alcohol intake.

## 2024-05-30 LAB — COMPREHENSIVE METABOLIC PANEL WITH GFR
AG Ratio: 1.8 (calc) (ref 1.0–2.5)
ALT: 41 U/L — ABNORMAL HIGH (ref 6–29)
AST: 34 U/L (ref 10–35)
Albumin: 4.4 g/dL (ref 3.6–5.1)
Alkaline phosphatase (APISO): 47 U/L (ref 31–125)
BUN: 14 mg/dL (ref 7–25)
CO2: 27 mmol/L (ref 20–32)
Calcium: 10.2 mg/dL (ref 8.6–10.2)
Chloride: 101 mmol/L (ref 98–110)
Creat: 0.67 mg/dL (ref 0.50–0.99)
Globulin: 2.5 g/dL (ref 1.9–3.7)
Glucose, Bld: 73 mg/dL (ref 65–99)
Potassium: 4 mmol/L (ref 3.5–5.3)
Sodium: 137 mmol/L (ref 135–146)
Total Bilirubin: 0.4 mg/dL (ref 0.2–1.2)
Total Protein: 6.9 g/dL (ref 6.1–8.1)
eGFR: 107 mL/min/1.73m2 (ref >=60)

## 2024-05-30 LAB — PROTEIN ELECTROPHORESIS, SERUM, WITH REFLEX
Albumin ELP: 4.2 g/dL (ref 3.8–4.8)
Alpha 1: 0.3 g/dL (ref 0.2–0.3)
Alpha 2: 0.6 g/dL (ref 0.5–0.9)
Beta 2: 0.4 g/dL (ref 0.2–0.5)
Beta Globulin: 0.5 g/dL (ref 0.4–0.6)
Gamma Globulin: 0.9 g/dL (ref 0.8–1.7)
Total Protein: 6.9 g/dL (ref 6.1–8.1)

## 2024-05-30 LAB — VITAMIN D 25 HYDROXY (VIT D DEFICIENCY, FRACTURES): Vit D, 25-Hydroxy: 54 ng/mL (ref 30–100)

## 2024-05-30 LAB — PHOSPHORUS: Phosphorus: 2.9 mg/dL (ref 2.5–4.5)

## 2024-05-30 LAB — PARATHYROID HORMONE, INTACT (NO CA): PTH: 13 pg/mL — ABNORMAL LOW (ref 16–77)

## 2024-05-30 NOTE — Progress Notes (Signed)
 SPEP  normal.

## 2024-05-31 ENCOUNTER — Ambulatory Visit (HOSPITAL_COMMUNITY)
Admission: RE | Admit: 2024-05-31 | Discharge: 2024-05-31 | Disposition: A | Discharge status: Home / Self Care | Source: Ambulatory Visit | Attending: Rheumatology | Admitting: Rheumatology

## 2024-05-31 DIAGNOSIS — T849XXS Unspecified complication of internal orthopedic prosthetic device, implant and graft, sequela: Secondary | ICD-10-CM | POA: Insufficient documentation

## 2024-05-31 DIAGNOSIS — Z780 Asymptomatic menopausal state: Secondary | ICD-10-CM | POA: Insufficient documentation

## 2024-05-31 DIAGNOSIS — Z7952 Long term (current) use of systemic steroids: Secondary | ICD-10-CM | POA: Insufficient documentation

## 2024-05-31 DIAGNOSIS — E559 Vitamin D deficiency, unspecified: Secondary | ICD-10-CM | POA: Insufficient documentation

## 2024-05-31 DIAGNOSIS — Z13820 Encounter for screening for osteoporosis: Secondary | ICD-10-CM | POA: Insufficient documentation

## 2024-06-02 NOTE — Progress Notes (Signed)
 DEXA scan is within normal limits.  Recommend calcium  1200 mg daily with vitamin D .

## 2024-06-03 ENCOUNTER — Encounter: Payer: Self-pay | Admitting: Rheumatology

## 2024-06-03 ENCOUNTER — Other Ambulatory Visit: Payer: Self-pay | Admitting: Physician Assistant

## 2024-06-03 ENCOUNTER — Other Ambulatory Visit: Payer: Self-pay

## 2024-06-03 ENCOUNTER — Other Ambulatory Visit (HOSPITAL_COMMUNITY): Payer: Self-pay

## 2024-06-03 DIAGNOSIS — M25571 Pain in right ankle and joints of right foot: Secondary | ICD-10-CM

## 2024-06-03 MED ORDER — AMITRIPTYLINE HCL 25 MG PO TABS
25.0000 mg | ORAL_TABLET | Freq: Every day | ORAL | 2 refills | Status: AC
  Filled 2024-06-03: qty 90, 90d supply, fill #0
  Filled 2024-08-27: qty 90, 90d supply, fill #1

## 2024-06-04 ENCOUNTER — Other Ambulatory Visit (HOSPITAL_COMMUNITY): Payer: Self-pay

## 2024-06-04 MED ORDER — VALACYCLOVIR HCL 1 G PO TABS
2000.0000 mg | ORAL_TABLET | ORAL | 1 refills | Status: AC
  Filled 2024-06-04: qty 40, 10d supply, fill #0
  Filled 2024-08-15: qty 40, 10d supply, fill #1

## 2024-06-06 ENCOUNTER — Other Ambulatory Visit (HOSPITAL_COMMUNITY): Payer: Self-pay

## 2024-06-11 ENCOUNTER — Encounter: Payer: Self-pay | Admitting: Sports Medicine

## 2024-06-11 ENCOUNTER — Ambulatory Visit: Admitting: Nutrition

## 2024-06-12 ENCOUNTER — Other Ambulatory Visit: Payer: Self-pay

## 2024-06-12 DIAGNOSIS — M25571 Pain in right ankle and joints of right foot: Secondary | ICD-10-CM

## 2024-06-12 DIAGNOSIS — Z981 Arthrodesis status: Secondary | ICD-10-CM

## 2024-06-20 ENCOUNTER — Other Ambulatory Visit: Payer: Self-pay | Admitting: Orthopedic Surgery

## 2024-06-20 DIAGNOSIS — M5030 Other cervical disc degeneration, unspecified cervical region: Secondary | ICD-10-CM

## 2024-06-21 ENCOUNTER — Ambulatory Visit: Admitting: Sports Medicine

## 2024-06-21 ENCOUNTER — Other Ambulatory Visit (HOSPITAL_COMMUNITY): Payer: Self-pay

## 2024-06-21 ENCOUNTER — Other Ambulatory Visit: Payer: Self-pay

## 2024-06-21 MED ORDER — METHOCARBAMOL 500 MG PO TABS
500.0000 mg | ORAL_TABLET | Freq: Three times a day (TID) | ORAL | 3 refills | Status: AC | PRN
  Filled 2024-06-21: qty 90, 30d supply, fill #0
  Filled 2024-07-19: qty 90, 30d supply, fill #1
  Filled 2024-08-15: qty 90, 30d supply, fill #2
  Filled 2024-09-17: qty 90, 30d supply, fill #3

## 2024-07-01 ENCOUNTER — Encounter: Payer: Self-pay | Admitting: Radiology

## 2024-07-10 ENCOUNTER — Other Ambulatory Visit (HOSPITAL_COMMUNITY): Payer: Self-pay

## 2024-07-16 ENCOUNTER — Other Ambulatory Visit: Payer: Self-pay

## 2024-07-19 ENCOUNTER — Other Ambulatory Visit (HOSPITAL_BASED_OUTPATIENT_CLINIC_OR_DEPARTMENT_OTHER): Payer: Self-pay

## 2024-07-22 ENCOUNTER — Encounter: Attending: Internal Medicine | Admitting: Nutrition

## 2024-07-22 ENCOUNTER — Encounter: Payer: Self-pay | Admitting: "Endocrinology

## 2024-07-22 ENCOUNTER — Encounter: Payer: Self-pay | Admitting: Nutrition

## 2024-07-22 ENCOUNTER — Ambulatory Visit (INDEPENDENT_AMBULATORY_CARE_PROVIDER_SITE_OTHER): Admitting: "Endocrinology

## 2024-07-22 VITALS — BP 122/84 | HR 88 | Ht 65.5 in | Wt 183.2 lb

## 2024-07-22 VITALS — Ht 65.5 in | Wt 183.0 lb

## 2024-07-22 DIAGNOSIS — E209 Hypoparathyroidism, unspecified: Secondary | ICD-10-CM | POA: Diagnosis not present

## 2024-07-22 DIAGNOSIS — E663 Overweight: Secondary | ICD-10-CM | POA: Insufficient documentation

## 2024-07-22 NOTE — Patient Instructions (Signed)
 Goals Established by Pt Keep working on increasing protein rich foods with fiber- dried beans, peas, lentils and whole grains.

## 2024-07-22 NOTE — Progress Notes (Signed)
 Medical Nutrition Therapy  Appointment Start time:  1430Appointment End time:  Primary concerns today: Overweight , Hyperlipidemia Referral diagnosis: E66.9, E78  Preferred learning style: NO Preference  Learning readiness: Ready   NUTRITION ASSESSMENT  Third Visit [employer] Employee  # [phone]  Has increased dried beans, adding more protein to her meals to help her foot heel. Limited on physical activity due to foot not healing. Increased fruits, vegetables and whole grains. Trying to aim for 25 grams of fiber per day. Lost 1 lb  She is willing to apply Lifestyle Medicine with focus on whole plant predominant foods and 6 pillars of health  Clinical Wt Readings from Last 3 Encounters:  07/22/24 183 lb 3.2 oz (83.1 kg)  05/28/24 184 lb 12.8 oz (83.8 kg)  05/23/24 182 lb (82.6 kg)   Ht Readings from Last 3 Encounters:  07/22/24 5' 5.5 (1.664 m)  05/28/24 5' 5.5 (1.664 m)  05/23/24 5' 5.5 (1.664 m)   There is no height or weight on file to calculate BMI. @BMIFA @ Facility age limit for growth %iles is 20 years. Facility age limit for growth %iles is 20 years.  Medical Hx:  Past Medical History:  Diagnosis Date   Complication of anesthesia    COVID 08/2020   mild case   Effusion into joint    right foot   Hot flashes due to menopause 07/09/2021   Hyperlipidemia    IUD (intrauterine device) in place 07/09/2021   PONV (postoperative nausea and vomiting)    just nausea    Medications:  Current Outpatient Medications on File Prior to Visit  Medication Sig Dispense Refill   amitriptyline  (ELAVIL ) 25 MG tablet Take 3 tablets (75 mg total) by mouth at bedtime. 90 tablet 3   amitriptyline  (ELAVIL ) 25 MG tablet Take 1 tablet (25 mg total) by mouth at bedtime. 90 tablet 2   Calcium  Carbonate (CALCIUM  500 PO) Take 500 mg by mouth daily.     cholecalciferol (VITAMIN D3) 25 MCG (1000 UNIT) tablet Take 1,000 Units by mouth daily.     diclofenac  (FLECTOR ) 1.3 % PTCH Place 1 patch onto  the skin 2 (two) times daily. (Patient not taking: Reported on 05/28/2024) 60 patch 3   estradiol  (ESTRACE ) 0.5 MG tablet Take 1 tablet (0.5 mg total) by mouth daily. 90 tablet 4   Evolocumab  (REPATHA ) 140 MG/ML SOSY Inject 140 mg into the skin every 14 (fourteen) days. 6 mL 0   famotidine (PEPCID) 20 MG tablet Take 20 mg by mouth 2 (two) times daily.     fenofibrate  (TRICOR ) 145 MG tablet Take 1 tablet (145 mg total) by mouth daily. 90 tablet 3   gabapentin  (NEURONTIN ) 300 MG capsule Take 1 capsule (300 mg total) by mouth 3 (three) times daily when necessary for neuropathy pain (Patient taking differently: Take 300 mg by mouth as needed.) 90 capsule 3   levonorgestrel  (MIRENA ) 20 MCG/24HR IUD 1 each by Intrauterine route once.     methocarbamol  (ROBAXIN ) 500 MG tablet Take 1 tablet (500 mg total) by mouth every 8 (eight) hours as needed for muscle spasms. 90 tablet 3   valACYclovir  (VALTREX ) 1000 MG tablet Take 2 tablets (2,000 mg total) by mouth at the start of symptoms and then 2 tablets (2,000 mg) 12 hours later. (Patient not taking: Reported on 05/28/2024) 40 tablet 1   valACYclovir  (VALTREX ) 1000 MG tablet Take 2 tablets (2,000 mg total) by mouth at the start of symptoms and then 2 tablets (2000mg ) 12 hours  later. 40 tablet 1   [DISCONTINUED] rosuvastatin  (CRESTOR ) 5 MG tablet Take 1 tablet (5 mg total) by mouth daily. (Patient not taking: Reported on 09/04/2020) 90 tablet 3   No current facility-administered medications on file prior to visit.     Labs:  Lab Results  Component Value Date   HGBA1C 5.0 03/12/2024      Latest Ref Rng & Units 05/28/2024    9:10 AM 03/12/2024    8:06 AM 08/26/2022   12:32 PM  CMP  Glucose 65 - 99 mg/dL 73  77  81   BUN 7 - 25 mg/dL 14  13  14    Creatinine 0.50 - 0.99 mg/dL 9.32  9.40  9.36   Sodium 135 - 146 mmol/L 137  136  138   Potassium 3.5 - 5.3 mmol/L 4.0  4.0  4.1   Chloride 98 - 110 mmol/L 101  102  99   CO2 20 - 32 mmol/L 27  25  22    Calcium   8.6 - 10.2 mg/dL 89.7  9.2  9.6   Total Protein 6.1 - 8.1 g/dL 6.1 - 8.1 g/dL 6.9    6.9  6.5  7.1   Total Bilirubin 0.2 - 1.2 mg/dL 0.4  0.4  0.3   Alkaline Phos 44 - 121 IU/L   72   AST 10 - 35 U/L 34  19  16   ALT 6 - 29 U/L 41  19  15    Lipid Panel     Component Value Date/Time   CHOL 183 03/12/2024 0806   CHOL 175 01/27/2023 0847   TRIG 284 (H) 03/12/2024 0806   HDL 51 03/12/2024 0806   HDL 49 01/27/2023 0847   CHOLHDL 3.6 03/12/2024 0806   LDLCALC 91 03/12/2024 0806   LABVLDL 40 01/27/2023 0847    Notable Signs/Symptoms: None  Lifestyle & Dietary Hx Lives with her husband. Works [employer].  Estimated daily fluid intake: 128 oz oz Supplements: Calcium  and Vit AD Sleep: 7/8 hrs.  Stress / self-care: some life stress Current average weekly physical activity: walking 2 miles a day  24-Hr Dietary Recall First Meal: Coffee- 1/2 and 1/2  3 tbsp and spenda, HMR bars protein bar Snack:  Second Meal:  Salad chicken, oil/vinegar dressing Snack: misc candy, crackers jacks Third Meal: hellos fresh meal-chicken cheese, carrots, mashed potatoes Snack: candy  Beverages: water   Estimated Energy Needs Calories: 1200 Carbohydrate: 135g Protein: 90g Fat: 33g   NUTRITION DIAGNOSIS  NI-1.7 Predicted excessive energy intake As related to higher calorie  high fat /sugar food intake.  As evidenced by BMI 29 and  TG 238 mg/dl.  NUTRITION INTERVENTION  Nutrition education (E-1) on the following topics:  Lifestyle Medicine  - Whole Food, Plant Predominant Nutrition is highly recommended: Eat Plenty of vegetables, Mushrooms, fruits, Legumes, Whole Grains, Nuts, seeds in lieu of processed meats, processed snacks/pastries red meat, poultry, eggs.    -It is better to avoid simple carbohydrates including: Cakes, Sweet Desserts, Ice Cream, Soda (diet and regular), Sweet Tea, Candies, Chips, Cookies, Store Bought Juices, Alcohol in Excess of  1-2 drinks a day, Lemonade,  Artificial  Sweeteners, Doughnuts, Coffee Creamers, Sugar-free Products, etc, etc.  This is not a complete list.....  Exercise: If you are able: 30 -60 minutes a day ,4 days a week, or 150 minutes a week.  The longer the better.  Combine stretch, strength, and aerobic activities.  If you were told in the past that you have high  risk for cardiovascular diseases, you may seek evaluation by your heart doctor prior to initiating moderate to intense exercise programs.   Handouts Provided Include  Lifestyle Medicine handouts  Learning Style & Readiness for Change Teaching method utilized: Visual & Auditory  Demonstrated degree of understanding via: Teach Back  Barriers to learning/adherence to lifestyle change: none  Goals Established by Pt Keep working on increasing protein rich foods with fiber- dried beans, peas, lentils and whole grains.  MONITORING & EVALUATION Dietary intake, weekly physical activity, and weight in PRN  Next Steps  Patient is to work on meal planning and whole plant based foods.SABRA

## 2024-07-22 NOTE — Progress Notes (Signed)
 Endocrinology Consult Note                                            07/22/2024, 3:09 PM   Subjective:    Patient ID: Kayla Chaney, female    DOB: 06-04-1976, PCP Tobie Suzzane POUR, MD   Past Medical History:  Diagnosis Date   Complication of anesthesia    COVID 08/2020   mild case   Effusion into joint    right foot   Hot flashes due to menopause 07/09/2021   Hyperlipidemia    IUD (intrauterine device) in place 07/09/2021   PONV (postoperative nausea and vomiting)    just nausea   Past Surgical History:  Procedure Laterality Date   ANKLE ARTHROSCOPY Right 07/20/2022   Procedure: RIGHT ANKLE ARTHROSCOPY;  Surgeon: Harden Jerona GAILS, MD;  Location: [facility] OR;  Service: Orthopedics;  Laterality: Right;   CESAREAN SECTION  2003, 2008   COLONOSCOPY WITH PROPOFOL  N/A 11/20/2020   Procedure: COLONOSCOPY WITH PROPOFOL ;  Surgeon: Cindie Carlin POUR, DO;  Location: [facility];  Service: Endoscopy;  Laterality: N/A;  am/ASA II, pt tested +1/24 thru [facility] at work <90 - results emailed to [place] - advised pt current arrival time of 7:00 - needed to know for transportation cy 3/21   DILITATION & CURRETTAGE/HYSTROSCOPY WITH ESSURE N/A 2008   Retained placenta from c-section birth   FOOT ARTHRODESIS Right 10/27/2021   Procedure: RIGHT SUBTALAR AND TALONAVICULAR FUSION;  Surgeon: Harden Jerona GAILS, MD;  Location: [facility] OR;  Service: Orthopedics;  Laterality: Right;   HARDWARE REMOVAL Right 07/20/2022   Procedure: REMOVAL SCREW RIGHT ANKLE;  Surgeon: Harden Jerona GAILS, MD;  Location: [facility] OR;  Service: Orthopedics;  Laterality: Right;   POLYPECTOMY  11/20/2020   Procedure: POLYPECTOMY;  Surgeon: Cindie Carlin POUR, DO;  Location: [facility];  Service: Endoscopy;;   Social History   Socioeconomic History   Marital status: Married    Spouse name: Not on file   Number of children: Not on file   Years of education: Not on file   Highest education level: Some college, no degree  Occupational  History    Comment: XRAY tech  Tobacco Use   Smoking status: Never    Passive exposure: Past   Smokeless tobacco: Never  Vaping Use   Vaping status: Never Used  Substance and Sexual Activity   Alcohol use: Not Currently    Comment: occ   Drug use: No   Sexual activity: Yes    Birth control/protection: I.U.D.  Other Topics Concern   Not on file  Social History Narrative   Not on file   Social Drivers of Health   Financial Resource Strain: Low Risk  (08/21/2023)   Overall Financial Resource Strain (CARDIA)    Difficulty of Paying Living Expenses: Not very hard  Food Insecurity: No Food Insecurity (08/21/2023)   Hunger Vital Sign    Worried About Running Out of Food in the Last Year: Never true    Ran Out of Food in the Last Year: Never true  Transportation Needs: No Transportation Needs (08/21/2023)   PRAPARE - Administrator, Civil Service (Medical): No    Lack of Transportation (Non-Medical): No  Physical Activity: Sufficiently Active (08/21/2023)   Exercise Vital Sign    Days of Exercise per Week: 3 days  Minutes of Exercise per Session: 60 min  Stress: Stress Concern Present (08/21/2023)   [employer] of Occupational Health - Occupational Stress Questionnaire    Feeling of Stress : To some extent  Social Connections: Socially Integrated (08/21/2023)   Social Connection and Isolation Panel    Frequency of Communication with Friends and Family: Once a week    Frequency of Social Gatherings with Friends and Family: Twice a week    Attends Religious Services: More than 4 times per year    Active Member of [employer] or Organizations: Yes    Attends Engineer, Structural: More than 4 times per year    Marital Status: Married   Family History  Problem Relation Age of Onset   Stroke Mother    Osteoporosis Mother    Hypertension Father    Diabetes Father    Hyperlipidemia Father    Heart failure Father    Atrial fibrillation Father    Brain  cancer Sister    Stroke Maternal Grandmother    Outpatient Encounter Medications as of 07/22/2024  Medication Sig   amitriptyline  (ELAVIL ) 25 MG tablet Take 3 tablets (75 mg total) by mouth at bedtime.   amitriptyline  (ELAVIL ) 25 MG tablet Take 1 tablet (25 mg total) by mouth at bedtime.   Calcium  Carbonate (CALCIUM  500 PO) Take 500 mg by mouth daily.   cholecalciferol (VITAMIN D3) 25 MCG (1000 UNIT) tablet Take 1,000 Units by mouth daily.   diclofenac  (FLECTOR ) 1.3 % PTCH Place 1 patch onto the skin 2 (two) times daily. (Patient not taking: Reported on 05/28/2024)   estradiol  (ESTRACE ) 0.5 MG tablet Take 1 tablet (0.5 mg total) by mouth daily.   Evolocumab  (REPATHA ) 140 MG/ML SOSY Inject 140 mg into the skin every 14 (fourteen) days.   famotidine (PEPCID) 20 MG tablet Take 20 mg by mouth 2 (two) times daily.   fenofibrate  (TRICOR ) 145 MG tablet Take 1 tablet (145 mg total) by mouth daily.   gabapentin  (NEURONTIN ) 300 MG capsule Take 1 capsule (300 mg total) by mouth 3 (three) times daily when necessary for neuropathy pain (Patient taking differently: Take 300 mg by mouth as needed.)   levonorgestrel  (MIRENA ) 20 MCG/24HR IUD 1 each by Intrauterine route once.   methocarbamol  (ROBAXIN ) 500 MG tablet Take 1 tablet (500 mg total) by mouth every 8 (eight) hours as needed for muscle spasms.   valACYclovir  (VALTREX ) 1000 MG tablet Take 2 tablets (2,000 mg total) by mouth at the start of symptoms and then 2 tablets (2,000 mg) 12 hours later. (Patient not taking: Reported on 05/28/2024)   valACYclovir  (VALTREX ) 1000 MG tablet Take 2 tablets (2,000 mg total) by mouth at the start of symptoms and then 2 tablets (2000mg ) 12 hours later.   [DISCONTINUED] rosuvastatin  (CRESTOR ) 5 MG tablet Take 1 tablet (5 mg total) by mouth daily. (Patient not taking: Reported on 09/04/2020)   No facility-administered encounter medications on file as of 07/22/2024.   ALLERGIES: No Known Allergies  VACCINATION  STATUS: Immunization History  Administered Date(s) Administered   Influenza Split 06/22/2023   Influenza,inj,Quad PF,6+ Mos 05/27/2019, 06/04/2021, 06/16/2022   Influenza-Unspecified 05/23/2014, 05/29/2016, 06/04/2020   Moderna SARS-COV2 Booster Vaccination 07/17/2020   Moderna Sars-Covid-2 Vaccination 09/03/2019, 10/04/2019   Tdap 11/10/2016    HPI Kayla Chaney is 48 y.o. female who presents today with a medical history as above. she is being seen in consultation for low PTH requested by Tobie Suzzane POUR, MD.  she is accompanied by her  husband to the clinic.  History is obtained from the family as well as chart review.   She denies any prior history of calcium , parathyroid , thyroid  deficits. Due to slow healing of lower extremity fusion /tendinosis, she underwent measurement of routine labs including PTH which was found to be low at 13 (normal for the lab 16- 77). 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  However, patient denies any direct exposure to neck radiation.    She denies any known or other endocrine deficits.    She is currently on ongoing calcium  supplement with no evidence of over supplement . She denies any personal history of osteoporosis, recent bone density was normal.   Her other medical problems include hyperlipidemia on treatment with Crestor , Tricor .    Review of Systems  Constitutional: + steady weight , no fatigue, no subjective hyperthermia, no subjective hypothermia Eyes: no blurry vision, no xerophthalmia ENT: no sore throat, no nodules palpated in throat, no dysphagia/odynophagia, no hoarseness Cardiovascular: no Chest Pain, no Shortness of Breath, no palpitations, no leg swelling Respiratory: no cough, no shortness of breath Gastrointestinal: no Nausea/Vomiting/Diarhhea Musculoskeletal: no muscle/joint aches Skin: no rashes Neurological: no tremors, no numbness, no tingling, no dizziness Psychiatric: no depression, no  anxiety  Objective:       07/22/2024    2:34 PM 07/22/2024    2:06 PM 05/28/2024    8:24 AM  Vitals with BMI  Height 5' 5.5 5' 5.5   Weight 183 lbs 183 lbs 3 oz   BMI 29.98 30.01   Systolic  122 132  Diastolic  84 85  Pulse  88 101    BP 122/84   Pulse 88   Ht 5' 5.5 (1.664 m)   Wt 183 lb 3.2 oz (83.1 kg)   BMI 30.02 kg/m   Wt Readings from Last 3 Encounters:  07/22/24 183 lb (83 kg)  07/22/24 183 lb 3.2 oz (83.1 kg)  05/28/24 184 lb 12.8 oz (83.8 kg)    Physical Exam  Constitutional:  Body mass index is 30.02 kg/m.,  not in acute distress, normal state of mind Eyes: PERRLA, EOMI, no exophthalmos ENT: moist mucous membranes, no gross thyromegaly, no gross cervical lymphadenopathy Cardiovascular: normal precordial activity, Regular Rate and Rhythm, no Murmur/Rubs/Gallops Respiratory:  adequate breathing efforts, no gross chest deformity, Clear to auscultation bilaterally Gastrointestinal: abdomen soft, Non -tender, No distension, Bowel Sounds present, no gross organomegaly Musculoskeletal: no gross deformities, strength intact in all four extremities, no peripheral edema Skin: moist, warm, no rashes Neurological: no tremor with outstretched hands, Deep tendon reflexes normal in bilateral lower extremities.  CMP ( most recent) CMP     Component Value Date/Time   NA 137 05/28/2024 0910   NA 138 08/26/2022 1232   K 4.0 05/28/2024 0910   CL 101 05/28/2024 0910   CO2 27 05/28/2024 0910   GLUCOSE 73 05/28/2024 0910   BUN 14 05/28/2024 0910   BUN 14 08/26/2022 1232   CREATININE 0.67 05/28/2024 0910   CALCIUM  10.2 05/28/2024 0910   PROT 6.9 05/28/2024 0910   PROT 6.9 05/28/2024 0910   PROT 7.1 08/26/2022 1232   ALBUMIN 4.5 08/26/2022 1232   AST 34 05/28/2024 0910   ALT 41 (H) 05/28/2024 0910   ALKPHOS 72 08/26/2022 1232   BILITOT 0.4 05/28/2024 0910   BILITOT 0.3 08/26/2022 1232   EGFR 107 05/28/2024 0910   EGFR 110 08/26/2022 1232   GFRNONAA 101 07/03/2020  1534  Diabetic Labs (most recent): Lab Results  Component Value Date   HGBA1C 5.0 03/12/2024   HGBA1C 5.3 08/26/2022     Lipid Panel ( most recent) Lipid Panel     Component Value Date/Time   CHOL 183 03/12/2024 0806   CHOL 175 01/27/2023 0847   TRIG 284 (H) 03/12/2024 0806   HDL 51 03/12/2024 0806   HDL 49 01/27/2023 0847   CHOLHDL 3.6 03/12/2024 0806   LDLCALC 91 03/12/2024 0806   LABVLDL 40 01/27/2023 0847      Lab Results  Component Value Date   TSH 1.980 08/26/2022   TSH 1.110 08/13/2019   TSH 1.500 01/25/2019   TSH 1.650 11/10/2016   FREET4 1.07 08/26/2022         Latest Reference Range & Units 08/26/22 12:32 03/12/24 08:06 05/28/24 09:10  eAG (mmol/L) mmol/L  5.4   Glucose 65 - 99 mg/dL 81 77 73  Hemoglobin J8R <5.7 % 5.3 5.0   Est. average glucose Bld gHb Est-mCnc mg/dL 894    PTH, Intact 16 - 77 pg/mL   13 (L)  TSH 0.450 - 4.500 uIU/mL 1.980    T4,Free(Direct) 0.82 - 1.77 ng/dL 8.92    (L): Data is abnormally low    Assessment & Plan:   1. Hypoparathyroidism  - Kayla Chaney  is being seen at a kind request of Tobie Suzzane POUR, MD. - I have reviewed her available  records and clinically evaluated the patient. - Based on these reviews, she has low PTH without any hypercalcemia or calcium  deficits.   Patient does not have clear risk factor for hypoparathyroidism.  Reportedly had  past occupational exposure to radiation at her job.  Her labs did not include magnesium.  Hypomagnesemia can rarely cause hypoparathyroidism.  -She is encouraged to stay on calcium  and vitamin D  supplement.  Her recent thyroid  function tests are consistent with euthyroid state. She will benefit from repeat, full set of parathyroid  evaluation including PTH/calcium , PTH RP, magnesium, phosphorus. She did have normal bone density recently on May 31, 2024.  - I did not initiate any new prescriptions today. - she is advised to maintain close follow up with Tobie Suzzane POUR, MD for primary care needs.   -Thank you for involving me in the care of this pleasant patient.  Time spent with the patient: 45  minutes spent in  counseling her low PTH and the rest in obtaining information about her symptoms, reviewing her previous labs/studies (including abstractions from other facilities),  evaluations, and treatments,  and developing a plan to confirm diagnosis and long term treatment based on the latest standards of care/guidelines; and documenting her care.  Kayla Chaney participated in the discussions, expressed understanding, and voiced agreement with the above plans.  All questions were answered to her satisfaction. she is encouraged to contact clinic should she have any questions or concerns prior to her return visit.  Follow up plan: Return in about 5 weeks (around 08/26/2024) for F/U with Pre-visit Labs.   Ranny Earl, MD [facility] [address] [place], [place] [address] Phone: [phone]  Fax: [phone]     07/22/2024, 3:09 PM  This note was partially dictated with voice recognition software. Similar sounding words can be transcribed inadequately or may not  be corrected upon review.

## 2024-07-23 DIAGNOSIS — H524 Presbyopia: Secondary | ICD-10-CM | POA: Diagnosis not present

## 2024-07-31 ENCOUNTER — Other Ambulatory Visit: Payer: Self-pay | Admitting: Internal Medicine

## 2024-07-31 DIAGNOSIS — Z789 Other specified health status: Secondary | ICD-10-CM

## 2024-07-31 DIAGNOSIS — E782 Mixed hyperlipidemia: Secondary | ICD-10-CM

## 2024-07-31 MED ORDER — REPATHA 140 MG/ML ~~LOC~~ SOSY
140.0000 mg | PREFILLED_SYRINGE | SUBCUTANEOUS | 0 refills | Status: AC
  Filled 2024-07-31: qty 6, 84d supply, fill #0

## 2024-08-01 ENCOUNTER — Other Ambulatory Visit: Payer: Self-pay

## 2024-08-01 DIAGNOSIS — E209 Hypoparathyroidism, unspecified: Secondary | ICD-10-CM | POA: Diagnosis not present

## 2024-08-02 ENCOUNTER — Other Ambulatory Visit: Payer: Self-pay

## 2024-08-06 LAB — PTH, INTACT AND CALCIUM
Calcium: 9.7 mg/dL (ref 8.6–10.2)
PTH: 24 pg/mL (ref 16–77)

## 2024-08-06 LAB — MAGNESIUM: Magnesium: 1.6 mg/dL (ref 1.5–2.5)

## 2024-08-06 LAB — PHOSPHORUS: Phosphorus: 3.2 mg/dL (ref 2.5–4.5)

## 2024-08-06 LAB — PTH-RELATED PEPTIDE: PTH-Related Protein (PTH-RP): 5 pg/mL — ABNORMAL LOW (ref 11–20)

## 2024-08-15 ENCOUNTER — Other Ambulatory Visit: Payer: Self-pay

## 2024-08-16 ENCOUNTER — Ambulatory Visit (HOSPITAL_COMMUNITY)
Admission: RE | Admit: 2024-08-16 | Discharge: 2024-08-16 | Disposition: A | Discharge status: Home / Self Care | Source: Ambulatory Visit | Attending: Orthopedic Surgery | Admitting: Orthopedic Surgery

## 2024-08-16 DIAGNOSIS — Z4789 Encounter for other orthopedic aftercare: Secondary | ICD-10-CM | POA: Diagnosis not present

## 2024-08-16 DIAGNOSIS — M19071 Primary osteoarthritis, right ankle and foot: Secondary | ICD-10-CM | POA: Diagnosis not present

## 2024-08-16 DIAGNOSIS — Z981 Arthrodesis status: Secondary | ICD-10-CM | POA: Insufficient documentation

## 2024-08-16 DIAGNOSIS — M25571 Pain in right ankle and joints of right foot: Secondary | ICD-10-CM | POA: Insufficient documentation

## 2024-08-18 ENCOUNTER — Encounter: Payer: Self-pay | Admitting: Orthopedic Surgery

## 2024-08-19 ENCOUNTER — Ambulatory Visit: Admitting: Orthopedic Surgery

## 2024-08-19 ENCOUNTER — Encounter: Payer: Self-pay | Admitting: Orthopedic Surgery

## 2024-08-19 DIAGNOSIS — Z981 Arthrodesis status: Secondary | ICD-10-CM

## 2024-08-19 DIAGNOSIS — M76829 Posterior tibial tendinitis, unspecified leg: Secondary | ICD-10-CM

## 2024-08-19 DIAGNOSIS — M25571 Pain in right ankle and joints of right foot: Secondary | ICD-10-CM

## 2024-08-19 NOTE — Progress Notes (Signed)
 "  Office Visit Note   Patient: Kayla Chaney           Date of Birth: 1976-01-29           MRN: [id number] Visit Date: 08/19/2024              Requested by: Tobie Suzzane POUR, MD [address] [place],  [place] [address] PCP: Tobie Suzzane POUR, MD  Chief Complaint  Patient presents with   Right Ankle - Follow-up    CT scan review       HPI: Discussed the use of AI scribe software for clinical note transcription with the patient, who gave verbal consent to proceed.  History of Present Illness Kayla Chaney is a 48 year old female with nonunion of the right subtalar and talonavicular joints status post fusion who presents for follow-up of persistent right subtalar joint pain and delayed bone healing.  She continues to experience intermittent right subtalar joint pain, with fluctuating severity. Approximately twice per month, pain is severe enough to limit activity and she would prefer to rest at home if not required to work. On other days, she is able to perform work duties with minimal discomfort. Morning pain has improved compared to previous months. She avoids excessive walking and limits activity on days with increased pain. She utilizes supportive footwear, including carbon fiber insoles at work, and avoids shoes that bend through the arch. She has not noticed worsening of symptoms.  She has been compliant with bone stimulator therapy, using it for at least six hours daily over the past three months. She is increasing dietary protein, vitamin D3, and calcium  intake. She previously underwent right foot and ankle fusion with bone grafting, and recalls extrusion of the bone graft in one area. She has not used her metal insoles recently but continues to use supportive shoes. She inquires about intermittent FMLA for days when pain is severe enough to require rest at home.  She expresses interest in surgical options should further intervention become necessary, including autologous bone graft  from the proximal tibia.  She is under endocrinology care for evaluation of abnormal parathyroid  protein levels, with an upcoming follow-up scheduled. She is aware that her parathyroid  protein is low (value of 5) and seeks clarification regarding its significance in relation to bone healing. She previously took a short course of prednisone  approximately six months after her surgery, but did not complete the full prescribed dose.     Assessment & Plan: Visit Diagnoses:  1. History of fusion of talotibial joint and subtalar joint   2. PTTD (posterior tibial tendon dysfunction)   3. 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 Talonavicular joint asymptomatic with fibrous union. Subtalar joint symptomatic with intermittent pain. Imaging shows interval healing. Surgical revision not indicated due to current symptoms and risks. - Continued bone stimulator therapy for six months. - Recommended FMLA for intermittent work absences during symptomatic episodes. - Advised rest and activity avoidance on days with significant pain; discouraged activity through pain. - Continued use of supportive footwear and carbon fiber insoles. - Encouraged dietary optimization with increased protein, vitamin D3, and calcium  intake. - Advised follow-up with endocrinology for abnormal parathyroid  protein levels. - Planned repeat CT scan of right ankle in three months to reassess healing. - Instructed to message  office after repeat CT scan or sooner if urgent issues arise. Discussed the possibility with repeat surgery we would proceed with autologous bone graft.     Follow-Up Instructions: No follow-ups on file.   Ortho Exam  Patient is alert, oriented, no adenopathy, well-dressed, normal affect, normal respiratory  effort. Physical Exam On examination patient has a good pulse her foot is plantigrade she has good range of motion the ankle without pain with range of motion of the ankle.  The surgical incisions are well-healed.  Reviewed the CT scan and the coronal views shows interval healing however in the radiographs of the talonavicular joint there is a fibrous union.  Patient states that she is completely asymptomatic in the talonavicular joint.  There is no signs of hardware failure.       Imaging: No results found. No images are attached to the encounter.  Labs: Lab Results  Component Value Date   HGBA1C 5.0 03/12/2024   HGBA1C 5.3 08/26/2022   LABORGA ESCHERICHIA COLI 10/28/2014     Lab Results  Component Value Date   ALBUMIN 4.5 08/26/2022   ALBUMIN 4.4 07/03/2020   ALBUMIN 4.5 08/13/2019    Lab Results  Component Value Date   MG 1.6 08/01/2024   Lab Results  Component Value Date   VD25OH 54 05/28/2024   VD25OH 31 03/12/2024   VD25OH 36.8 08/26/2022    No results found for: PREALBUMIN    Latest Ref Rng & Units 03/12/2024    8:06 AM 08/26/2022   12:32 PM 07/20/2022    7:05 AM  CBC EXTENDED  WBC 3.8 - 10.8 Thousand/uL 6.8  7.9  7.3   RBC 3.80 - 5.10 Million/uL 4.23  4.28  4.48   Hemoglobin 11.7 - 15.5 g/dL 86.7  86.5  85.5   HCT 35.0 - 45.0 % 40.7  39.2  40.7   Platelets 140 - 400 Thousand/uL 219  274  278   NEUT# 1,500 - 7,800 cells/uL 4,196  4.8    Lymph# 0.7 - 3.1 x10E3/uL  2.7       There is no height or weight on file to calculate BMI.  Orders:  No orders of the defined types were placed in this encounter.  No orders of the defined types were placed in this encounter.    Procedures: No procedures performed  Clinical Data: No additional findings.  ROS:  All other systems negative, except as noted in the HPI. Review of Systems  Objective: Vital Signs: There were no vitals taken for this visit.  Specialty Comments:  No specialty comments  available.  PMFS History: Patient Active Problem List   Diagnosis Date Noted   Peroneal tendinosis 02/27/2024   Overweight (BMI 25.0-29.9) 08/25/2023   Impingement syndrome of right ankle 07/20/2022   Pain from implanted hardware 07/20/2022   Encounter for annual general medical examination with abnormal findings in adult 07/13/2022   Posterior tibialis tendon insufficiency    Palpitations 07/09/2021   Hot flashes due to menopause 07/09/2021   IUD (intrauterine device) in place 07/09/2021   Hyperlipemia 07/09/2021   Insomnia 11/13/2013   Degenerative joint disease of cervical spine 11/13/2013   Past Medical History:  Diagnosis Date   Complication of anesthesia    COVID 08/2020   mild case   Effusion into joint    right foot   Hot flashes due to menopause 07/09/2021   Hyperlipidemia    IUD (intrauterine device) in place 07/09/2021   PONV (postoperative nausea and vomiting)  just nausea    Family History  Problem Relation Age of Onset   Stroke Mother    Osteoporosis Mother    Hypertension Father    Diabetes Father    Hyperlipidemia Father    Heart failure Father    Atrial fibrillation Father    Brain cancer Sister    Stroke Maternal Grandmother     Past Surgical History:  Procedure Laterality Date   ANKLE ARTHROSCOPY Right 07/20/2022   Procedure: RIGHT ANKLE ARTHROSCOPY;  Surgeon: Harden Jerona GAILS, MD;  Location: [facility] OR;  Service: Orthopedics;  Laterality: Right;   CESAREAN SECTION  2003, 2008   COLONOSCOPY WITH PROPOFOL  N/A 11/20/2020   Procedure: COLONOSCOPY WITH PROPOFOL ;  Surgeon: Cindie Carlin POUR, DO;  [facility];  Service: Endoscopy;  Laterality: N/A;  am/ASA II, pt tested +1/24 thru Health at work <90 - results emailed to [place] - advised pt current arrival time of 7:00 - needed to know for transportation cy 3/21   DILITATION & CURRETTAGE/HYSTROSCOPY WITH ESSURE N/A 2008   Retained placenta from c-section birth   FOOT ARTHRODESIS Right 10/27/2021    Procedure: RIGHT SUBTALAR AND TALONAVICULAR FUSION;  Surgeon: Harden Jerona GAILS, MD;  Location: [facility] OR;  Service: Orthopedics;  Laterality: Right;   HARDWARE REMOVAL Right 07/20/2022   Procedure: REMOVAL SCREW RIGHT ANKLE;  Surgeon: Harden Jerona GAILS, MD;  Location: [facility] OR;  Service: Orthopedics;  Laterality: Right;   POLYPECTOMY  11/20/2020   Procedure: POLYPECTOMY;  Surgeon: Cindie Carlin POUR, DO;  [facility];  Service: Endoscopy;;   Social History   Occupational History    Comment: XRAY tech  Tobacco Use   Smoking status: Never    Passive exposure: Past   Smokeless tobacco: Never  Vaping Use   Vaping status: Never Used  Substance and Sexual Activity   Alcohol use: Not Currently    Comment: occ   Drug use: No   Sexual activity: Yes    Birth control/protection: I.U.D.         "

## 2024-08-27 ENCOUNTER — Other Ambulatory Visit (HOSPITAL_COMMUNITY): Payer: Self-pay

## 2024-08-27 ENCOUNTER — Ambulatory Visit (INDEPENDENT_AMBULATORY_CARE_PROVIDER_SITE_OTHER): Admitting: "Endocrinology

## 2024-08-27 ENCOUNTER — Encounter: Payer: Self-pay | Admitting: "Endocrinology

## 2024-08-27 ENCOUNTER — Other Ambulatory Visit: Payer: Self-pay

## 2024-08-27 VITALS — BP 135/73 | HR 77 | Resp 18 | Ht 65.5 in | Wt 186.0 lb

## 2024-08-27 DIAGNOSIS — E209 Hypoparathyroidism, unspecified: Secondary | ICD-10-CM

## 2024-08-27 MED ORDER — MAGNESIUM 250 MG PO CAPS
250.0000 mg | ORAL_CAPSULE | Freq: Every day | ORAL | 0 refills | Status: DC
  Filled 2024-08-27 – 2024-09-04 (×2): qty 60, fill #0

## 2024-08-27 NOTE — Progress Notes (Signed)
 "                                                      08/27/2024, 5:07 PM  Endocrinology follow-up note   Subjective:    Patient ID: Kayla Chaney, female    DOB: 10-Aug-1976, PCP Tobie Suzzane POUR, MD   Past Medical History:  Diagnosis Date   Complication of anesthesia    COVID 08/2020   mild case   Effusion into joint    right foot   Hot flashes due to menopause 07/09/2021   Hyperlipidemia    IUD (intrauterine device) in place 07/09/2021   PONV (postoperative nausea and vomiting)    just nausea   Past Surgical History:  Procedure Laterality Date   ANKLE ARTHROSCOPY Right 07/20/2022   Procedure: RIGHT ANKLE ARTHROSCOPY;  Surgeon: Harden Jerona GAILS, MD;  Location: [facility] OR;  Service: Orthopedics;  Laterality: Right;   CESAREAN SECTION  2003, 2008   COLONOSCOPY WITH PROPOFOL  N/A 11/20/2020   Procedure: COLONOSCOPY WITH PROPOFOL ;  Surgeon: Cindie Carlin POUR, DO;  [facility];  Service: Endoscopy;  Laterality: N/A;  am/ASA II, pt tested +1/24 thru [employer] at work <90 - results emailed to [place] - advised pt current arrival time of 7:00 - needed to know for transportation cy 3/21   DILITATION & CURRETTAGE/HYSTROSCOPY WITH ESSURE N/A 2008   Retained placenta from c-section birth   FOOT ARTHRODESIS Right 10/27/2021   Procedure: RIGHT SUBTALAR AND TALONAVICULAR FUSION;  Surgeon: Harden Jerona GAILS, MD;  Location: [facility] OR;  Service: Orthopedics;  Laterality: Right;   HARDWARE REMOVAL Right 07/20/2022   Procedure: REMOVAL SCREW RIGHT ANKLE;  Surgeon: Harden Jerona GAILS, MD;  Location: [facility] OR;  Service: Orthopedics;  Laterality: Right;   POLYPECTOMY  11/20/2020   Procedure: POLYPECTOMY;  Surgeon: Cindie Carlin POUR, DO;  [facility];  Service: Endoscopy;;   Social History   Socioeconomic History   Marital status: Married    Spouse name: Not on file   Number of children: Not on file   Years of education: Not on file   Highest education level: Some college, no degree   Occupational History    Comment: XRAY tech  Tobacco Use   Smoking status: Never    Passive exposure: Past   Smokeless tobacco: Never  Vaping Use   Vaping status: Never Used  Substance and Sexual Activity   Alcohol use: Not Currently    Comment: occ   Drug use: No   Sexual activity: Yes    Birth control/protection: I.U.D.  Other Topics Concern   Not on file  Social History Narrative   Not on file   Social Drivers of Health   Tobacco Use: Low Risk (08/27/2024)   Patient History    Smoking Tobacco Use: Never    Smokeless Tobacco Use: Never    Passive Exposure: Past  Financial Resource Strain: Low Risk (08/21/2023)   Overall Financial Resource Strain (CARDIA)    Difficulty of Paying Living Expenses: Not very hard  Food Insecurity: No Food Insecurity (08/21/2023)   Hunger Vital Sign    Worried About Running Out of Food in the Last Year: Never true    Ran Out of Food in the Last Year: Never true  Transportation Needs: No Transportation Needs (08/21/2023)   PRAPARE - Transportation  Lack of Transportation (Medical): No    Lack of Transportation (Non-Medical): No  Physical Activity: Sufficiently Active (08/21/2023)   Exercise Vital Sign    Days of Exercise per Week: 3 days    Minutes of Exercise per Session: 60 min  Stress: Stress Concern Present (08/21/2023)   [employer] of Occupational Health - Occupational Stress Questionnaire    Feeling of Stress : To some extent  Social Connections: Socially Integrated (08/21/2023)   Social Connection and Isolation Panel    Frequency of Communication with Friends and Family: Once a week    Frequency of Social Gatherings with Friends and Family: Twice a week    Attends Religious Services: More than 4 times per year    Active Member of Clubs or Organizations: Yes    Attends Banker Meetings: More than 4 times per year    Marital Status: Married  Depression (PHQ2-9): Low Risk (02/27/2024)   Depression (PHQ2-9)     PHQ-2 Score: 0  Alcohol Screen: Low Risk (08/21/2023)   Alcohol Screen    Last Alcohol Screening Score (AUDIT): 1  Housing: Low Risk (08/21/2023)   Housing Stability Vital Sign    Unable to Pay for Housing in the Last Year: No    Number of Times Moved in the Last Year: 0    Homeless in the Last Year: No  Utilities: Not At Risk (08/24/2022)   AHC Utilities    Threatened with loss of utilities: No  Health Literacy: Not on file   Family History  Problem Relation Age of Onset   Stroke Mother    Osteoporosis Mother    Hypertension Father    Diabetes Father    Hyperlipidemia Father    Heart failure Father    Atrial fibrillation Father    Brain cancer Sister    Stroke Maternal Grandmother    Outpatient Encounter Medications as of 08/27/2024  Medication Sig   amitriptyline  (ELAVIL ) 25 MG tablet Take 1 tablet (25 mg total) by mouth at bedtime.   Calcium  Carbonate (CALCIUM  500 PO) Take 500 mg by mouth daily.   cholecalciferol (VITAMIN D3) 25 MCG (1000 UNIT) tablet Take 1,000 Units by mouth daily.   estradiol  (ESTRACE ) 0.5 MG tablet Take 1 tablet (0.5 mg total) by mouth daily.   Evolocumab  (REPATHA ) 140 MG/ML SOSY Inject 140 mg into the skin every 14 (fourteen) days.   famotidine (PEPCID) 20 MG tablet Take 20 mg by mouth 2 (two) times daily.   fenofibrate  (TRICOR ) 145 MG tablet Take 1 tablet (145 mg total) by mouth daily.   levonorgestrel  (MIRENA ) 20 MCG/24HR IUD 1 each by Intrauterine route once.   Magnesium 250 MG CAPS Take 250 mg by mouth daily with lunch.   methocarbamol  (ROBAXIN ) 500 MG tablet Take 1 tablet (500 mg total) by mouth every 8 (eight) hours as needed for muscle spasms.   valACYclovir  (VALTREX ) 1000 MG tablet Take 2 tablets (2,000 mg total) by mouth at the start of symptoms and then 2 tablets (2000mg ) 12 hours later.   valACYclovir  (VALTREX ) 1000 MG tablet Take 2 tablets (2,000 mg total) by mouth at the start of symptoms and then 2 tablets (2,000 mg) 12 hours later.  (Patient not taking: Reported on 08/27/2024)   [DISCONTINUED] amitriptyline  (ELAVIL ) 25 MG tablet Take 3 tablets (75 mg total) by mouth at bedtime.   [DISCONTINUED] diclofenac  (FLECTOR ) 1.3 % PTCH Place 1 patch onto the skin 2 (two) times daily. (Patient not taking: Reported on 05/28/2024)   [DISCONTINUED]  gabapentin  (NEURONTIN ) 300 MG capsule Take 1 capsule (300 mg total) by mouth 3 (three) times daily when necessary for neuropathy pain (Patient taking differently: Take 300 mg by mouth as needed.)   [DISCONTINUED] rosuvastatin  (CRESTOR ) 5 MG tablet Take 1 tablet (5 mg total) by mouth daily. (Patient not taking: Reported on 09/04/2020)   No facility-administered encounter medications on file as of 08/27/2024.   ALLERGIES: No Known Allergies  VACCINATION STATUS: Immunization History  Administered Date(s) Administered   Influenza Split 06/22/2023   Influenza,inj,Quad PF,6+ Mos 05/27/2019, 06/04/2021, 06/16/2022   Influenza-Unspecified 05/23/2014, 05/29/2016, 06/04/2020   Moderna SARS-COV2 Booster Vaccination 07/17/2020   Moderna Sars-Covid-2 Vaccination 09/03/2019, 10/04/2019   Tdap 11/10/2016    HPI Kayla Chaney is 48 y.o. female who presents today with a medical history as above. she is being seen in follow-up after she was seen in consultation for low PTH requested by Tobie Suzzane POUR, MD.   See notes from her last visit. She denies any prior history of calcium , parathyroid , thyroid  deficits. Due to slow healing of lower extremity fusion /tendinosis, she underwent measurement of routine labs including PTH which was found to be low at 13 (normal for the lab 16- 77). Her repeat complete labs show normal PTH, calcium , phosphorus, low PTH RP, low normal magnesium. 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  However, patient denies any direct exposure to neck radiation.    She denies any known or other endocrine deficits.  She has no new complaints today.  She is  currently on ongoing calcium  supplement with no evidence of over supplement . She denies any personal history of osteoporosis, recent bone density was normal.   Her other medical problems include hyperlipidemia on treatment with Crestor , Tricor .    Review of Systems  Constitutional: + steady weight , no fatigue, no subjective hyperthermia, no subjective hypothermia Eyes: no blurry vision, no xerophthalmia   Objective:       08/27/2024    3:48 PM 07/22/2024    2:34 PM 07/22/2024    2:06 PM  Vitals with BMI  Height 5' 5.5 5' 5.5 5' 5.5  Weight 186 lbs 183 lbs 183 lbs 3 oz  BMI 30.47 29.98 30.01  Systolic 135  122  Diastolic 73  84  Pulse 77  88    BP 135/73   Pulse 77   Resp 18   Ht 5' 5.5 (1.664 m)   Wt 186 lb (84.4 kg)   SpO2 96%   BMI 30.48 kg/m   Wt Readings from Last 3 Encounters:  08/27/24 186 lb (84.4 kg)  07/22/24 183 lb (83 kg)  07/22/24 183 lb 3.2 oz (83.1 kg)    Physical Exam  Constitutional:  Body mass index is 30.48 kg/m.,  not in acute distress, normal state of mind Eyes: PERRLA, EOMI, no exophthalmos ENT: moist mucous membranes, no gross thyromegaly, no gross cervical lymphadenopathy   CMP ( most recent) CMP     Component Value Date/Time   NA 137 05/28/2024 0910   NA 138 08/26/2022 1232   K 4.0 05/28/2024 0910   CL 101 05/28/2024 0910   CO2 27 05/28/2024 0910   GLUCOSE 73 05/28/2024 0910   BUN 14 05/28/2024 0910   BUN 14 08/26/2022 1232   CREATININE 0.67 05/28/2024 0910   CALCIUM  9.7 08/01/2024 0831   PROT 6.9 05/28/2024 0910   PROT 6.9 05/28/2024 0910   PROT 7.1 08/26/2022 1232   ALBUMIN 4.5 08/26/2022 1232  AST 34 05/28/2024 0910   ALT 41 (H) 05/28/2024 0910   ALKPHOS 72 08/26/2022 1232   BILITOT 0.4 05/28/2024 0910   BILITOT 0.3 08/26/2022 1232   EGFR 107 05/28/2024 0910   EGFR 110 08/26/2022 1232   GFRNONAA 101 07/03/2020 1534     Diabetic Labs (most recent): Lab Results  Component Value Date   HGBA1C 5.0  03/12/2024   HGBA1C 5.3 08/26/2022     Lipid Panel ( most recent) Lipid Panel     Component Value Date/Time   CHOL 183 03/12/2024 0806   CHOL 175 01/27/2023 0847   TRIG 284 (H) 03/12/2024 0806   HDL 51 03/12/2024 0806   HDL 49 01/27/2023 0847   CHOLHDL 3.6 03/12/2024 0806   LDLCALC 91 03/12/2024 0806   LABVLDL 40 01/27/2023 0847      Lab Results  Component Value Date   TSH 1.980 08/26/2022   TSH 1.110 08/13/2019   TSH 1.500 01/25/2019   TSH 1.650 11/10/2016   FREET4 1.07 08/26/2022         Latest Reference Range & Units 08/26/22 12:32 03/12/24 08:06 05/28/24 09:10  eAG (mmol/L) mmol/L  5.4   Glucose 65 - 99 mg/dL 81 77 73  Hemoglobin J8R <5.7 % 5.3 5.0   Est. average glucose Bld gHb Est-mCnc mg/dL 894    PTH, Intact 16 - 77 pg/mL   13 (L)  TSH 0.450 - 4.500 uIU/mL 1.980    T4,Free(Direct) 0.82 - 1.77 ng/dL 8.92    (L): Data is abnormally low  Recent Results (from the past 2160 hours)  PTH, intact and calcium      Status: None   Collection Time: 08/01/24  8:31 AM  Result Value Ref Range   PTH 24 16 - 77 pg/mL    Comment: . Interpretive Guide    Intact PTH           Calcium  ------------------    ----------           ------- Normal Parathyroid     Normal               Normal Hypoparathyroidism    Low or Low Normal    Low Hyperparathyroidism    Primary            Normal or High       High    Secondary          High                 Normal or Low    Tertiary           High                 High Non-Parathyroid     Hypercalcemia      Low or Low Normal    High .    Calcium  9.7 8.6 - 10.2 mg/dL  Magnesium     Status: None   Collection Time: 08/01/24  8:31 AM  Result Value Ref Range   Magnesium 1.6 1.5 - 2.5 mg/dL  Phosphorus     Status: None   Collection Time: 08/01/24  8:31 AM  Result Value Ref Range   Phosphorus 3.2 2.5 - 4.5 mg/dL  PTH-related peptide     Status: Abnormal   Collection Time: 08/01/24  8:31 AM  Result Value Ref Range   PTH-Related Protein  (PTH-RP) 5 (L) 11 - 20 pg/mL    Comment: . This is a C-terminal PTH-RP assay. PTH-RP is  useful in the differential diagnosis of hypercalcemia and levels may be elevated in patients with tumor-associated hypercalcemia. Elevated results may also be observed in patients with renal disease. . This test was developed and its analytical performance characteristics have been determined by [employer]. It has not been cleared or approved by the FDA. This assay has been validated pursuant to the CLIA regulations and is used for clinical purposes.      Assessment & Plan:   1. Hypoparathyroidism-resolved   - I have reviewed her  new and available  records and clinically evaluated the patient. - Based on these reviews, she has corrected PTH associated with normocalcemia.  She has normal phosphorus, low normal magnesium.  She will benefit from continued supplement with low-dose calcium  long-term and  magnesium 250 mg p.o. daily for the next 60 days.  Hypomagnesemia likely to contribute for low PTH.  Patient does not have clear risk factor for hypoparathyroidism.  Reportedly had  past occupational exposure to radiation at her job.    Her recent thyroid  function tests are consistent with euthyroid state.  She did have normal bone density recently on May 31, 2024.  Patient will return to clinic only as needed.   - she is advised to maintain close follow up with Tobie Suzzane POUR, MD for primary care needs.   I spent  20  minutes in the care of the patient today including review of labs from Thyroid  Function, CMP, and other relevant labs ; imaging/biopsy records (current and previous including abstractions from other facilities); face-to-face time discussing  her lab results and symptoms, medications doses, her options of short and long term treatment based on the latest standards of care / guidelines;   and documenting the encounter.  Cadey W Lina  participated in the discussions,  expressed understanding, and voiced agreement with the above plans.  All questions were answered to her satisfaction. she is encouraged to contact clinic should she have any questions or concerns prior to her return visit.   Follow up plan: Return if symptoms worsen or fail to improve.   Ranny Earl, MD [facility] [address] [place], [place] [address] Phone: [phone]  Fax: [phone]     08/27/2024, 5:07 PM  This note was partially dictated with voice recognition software. Similar sounding words can be transcribed inadequately or may not  be corrected upon review.  "

## 2024-08-28 ENCOUNTER — Other Ambulatory Visit (HOSPITAL_COMMUNITY): Payer: Self-pay

## 2024-09-03 ENCOUNTER — Other Ambulatory Visit (HOSPITAL_COMMUNITY): Payer: Self-pay

## 2024-09-03 ENCOUNTER — Other Ambulatory Visit: Payer: Self-pay

## 2024-09-03 ENCOUNTER — Encounter: Payer: Self-pay | Admitting: Internal Medicine

## 2024-09-03 ENCOUNTER — Ambulatory Visit: Admitting: Internal Medicine

## 2024-09-03 VITALS — BP 120/69 | HR 91 | Ht 65.5 in | Wt 184.2 lb

## 2024-09-03 DIAGNOSIS — G4709 Other insomnia: Secondary | ICD-10-CM | POA: Diagnosis not present

## 2024-09-03 DIAGNOSIS — E669 Obesity, unspecified: Secondary | ICD-10-CM

## 2024-09-03 DIAGNOSIS — E559 Vitamin D deficiency, unspecified: Secondary | ICD-10-CM | POA: Diagnosis not present

## 2024-09-03 DIAGNOSIS — R739 Hyperglycemia, unspecified: Secondary | ICD-10-CM

## 2024-09-03 DIAGNOSIS — M6788 Other specified disorders of synovium and tendon, other site: Secondary | ICD-10-CM

## 2024-09-03 DIAGNOSIS — E78019 Familial hypercholesterolemia, unspecified: Secondary | ICD-10-CM | POA: Diagnosis not present

## 2024-09-03 MED ORDER — PHENTERMINE HCL 37.5 MG PO TABS
37.5000 mg | ORAL_TABLET | Freq: Every day | ORAL | 3 refills | Status: AC
  Filled 2024-09-03: qty 30, 30d supply, fill #0

## 2024-09-03 MED ORDER — FENOFIBRATE 145 MG PO TABS
145.0000 mg | ORAL_TABLET | Freq: Every day | ORAL | 3 refills | Status: AC
  Filled 2024-09-03: qty 90, 90d supply, fill #0

## 2024-09-03 NOTE — Assessment & Plan Note (Signed)
 Check lipid profile Has responded well to Repatha  LDL has been up to 225 - likely has familial hypercholesterolemia

## 2024-09-03 NOTE — Patient Instructions (Signed)
 Please start taking Phentermine  half tablet once daily for 2 week and then 1 tablet once daily.  Please continue to take other medications as prescribed.  Please continue to follow low carb diet and perform moderate exercise/walking at least 150 mins/week.  Please get fasting blood tests done before the next visit.

## 2024-09-03 NOTE — Assessment & Plan Note (Addendum)
 Overall well-controlled Takes amitriptyline  25 mg nightly

## 2024-09-03 NOTE — Assessment & Plan Note (Signed)
 BMI Readings from Last 3 Encounters:  09/03/24 30.19 kg/m  08/27/24 30.48 kg/m  07/22/24 29.99 kg/m   Advised to continue to follow low-carb diet and perform moderate exercise/walking at least 150 minutes/week Had discussion about medical weight loss options, including GLP-1 agonist therapies, but cost/insurance coverage is a concern Started phentermine  - advised to take half tablet once daily for 2 weeks and then increase to 1 tablet once daily

## 2024-09-03 NOTE — Progress Notes (Signed)
 "  Established Patient Office Visit  Subjective:  Patient ID: Kayla Chaney, female    DOB: 02/24/1976  Age: 49 y.o. MRN: [id number]  CC:  Chief Complaint  Patient presents with   Hyperlipidemia    6 month f/u    Obesity    Would like to try phentermine .    HPI Kayla Chaney is a 49 y.o. female with past medical history of HLD who presents for f/u of her chronic medical conditions.  HLD: She has responded well to Repatha . Her LDL has improved to 91 in 90/74, has been up to 225 in 12/23.  She has improved her diet as well after consultation with nutritionist.  She takes fenofibric for hypertriglyceridemia.  Insomnia: She currently takes amitriptyline  25 mg at bedtime, which was prescribed for complex regional pain syndrome from ankle surgery.  Peroneal tendinosis: S/p PRP injection, followed by Ortho care [facility].  She still has ankle pain, has implanted hardware.  She feels better with amitriptyline  overall.  Obesity: She has been struggling to lose weight for the last 2 years since her ankle surgery.  She has gained about 20 lbs since then.  She is trying to follow low-carb diet and tries to walk regularly.  She is unable to resume her Zumba due to ankle pain.  She is interested in medical weight loss treatment.   Past Medical History:  Diagnosis Date   Complication of anesthesia    COVID 08/2020   mild case   Effusion into joint    right foot   Hot flashes due to menopause 07/09/2021   Hyperlipidemia    IUD (intrauterine device) in place 07/09/2021   PONV (postoperative nausea and vomiting)    just nausea    Past Surgical History:  Procedure Laterality Date   ANKLE ARTHROSCOPY Right 07/20/2022   Procedure: RIGHT ANKLE ARTHROSCOPY;  Surgeon: Harden Jerona GAILS, MD;  Location: [facility] OR;  Service: Orthopedics;  Laterality: Right;   CESAREAN SECTION  2003, 2008   COLONOSCOPY WITH PROPOFOL  N/A 11/20/2020   Procedure: COLONOSCOPY WITH PROPOFOL ;  Surgeon: Cindie Carlin POUR, DO;   Location: [facility];  Service: Endoscopy;  Laterality: N/A;  am/ASA II, pt tested +1/24 thru [employer] at [facility] <90 - results emailed to [place] - advised pt current arrival time of 7:00 - needed to know for transportation cy 3/21   DILITATION & CURRETTAGE/HYSTROSCOPY WITH ESSURE N/A 2008   Retained placenta from c-section birth   FOOT ARTHRODESIS Right 10/27/2021   Procedure: RIGHT SUBTALAR AND TALONAVICULAR FUSION;  Surgeon: Harden Jerona GAILS, MD;  Location: [facility] OR;  Service: Orthopedics;  Laterality: Right;   HARDWARE REMOVAL Right 07/20/2022   Procedure: REMOVAL SCREW RIGHT ANKLE;  Surgeon: Harden Jerona GAILS, MD;  Location: [facility] OR;  Service: Orthopedics;  Laterality: Right;   POLYPECTOMY  11/20/2020   Procedure: POLYPECTOMY;  Surgeon: Cindie Carlin POUR, DO;  Location: [facility];  Service: Endoscopy;;    Family History  Problem Relation Age of Onset   Stroke Mother    Osteoporosis Mother    Hypertension Father    Diabetes Father    Hyperlipidemia Father    Heart failure Father    Atrial fibrillation Father    Brain cancer Sister    Stroke Maternal Grandmother     Social History   Socioeconomic History   Marital status: Married    Spouse name: Not on file   Number of children: Not on file   Years of education: Not on  file   Highest education level: Associate degree: occupational, scientist, product/process development, or vocational program  Occupational History    Comment: XRAY tech  Tobacco Use   Smoking status: Never    Passive exposure: Past   Smokeless tobacco: Never  Vaping Use   Vaping status: Never Used  Substance and Sexual Activity   Alcohol use: Not Currently    Comment: occ   Drug use: No   Sexual activity: Yes    Birth control/protection: I.U.D.  Other Topics Concern   Not on file  Social History Narrative   Not on file   Social Drivers of Health   Tobacco Use: Low Risk (09/03/2024)   Patient History    Smoking Tobacco Use: Never    Smokeless Tobacco Use: Never    Passive Exposure:  Past  Financial Resource Strain: Low Risk (08/30/2024)   Overall Financial Resource Strain (CARDIA)    Difficulty of Paying Living Expenses: Not very hard  Food Insecurity: No Food Insecurity (08/30/2024)   Epic    Worried About Radiation Protection Practitioner of Food in the Last Year: Never true    Ran Out of Food in the Last Year: Never true  Transportation Needs: No Transportation Needs (08/30/2024)   Epic    Lack of Transportation (Medical): No    Lack of Transportation (Non-Medical): No  Physical Activity: Sufficiently Active (08/30/2024)   Exercise Vital Sign    Days of Exercise per Week: 5 days    Minutes of Exercise per Session: 30 min  Stress: No Stress Concern Present (08/30/2024)   [employer] of Occupational Health - Occupational Stress Questionnaire    Feeling of Stress: Not at all  Social Connections: Socially Integrated (08/30/2024)   Social Connection and Isolation Panel    Frequency of Communication with Friends and Family: Once a week    Frequency of Social Gatherings with Friends and Family: More than three times a week    Attends Religious Services: More than 4 times per year    Active Member of [employer] or Organizations: Yes    Attends Banker Meetings: More than 4 times per year    Marital Status: Married  Catering Manager Violence: Not At Risk (08/24/2022)   Humiliation, Afraid, Rape, and Kick questionnaire    Fear of Current or Ex-Partner: No    Emotionally Abused: No    Physically Abused: No    Sexually Abused: No  Depression (PHQ2-9): Low Risk (09/03/2024)   Depression (PHQ2-9)    PHQ-2 Score: 3  Alcohol Screen: Low Risk (08/21/2023)   Alcohol Screen    Last Alcohol Screening Score (AUDIT): 1  Housing: Low Risk (08/30/2024)   Epic    Unable to Pay for Housing in the Last Year: No    Number of Times Moved in the Last Year: 0    Homeless in the Last Year: No  Utilities: Not At Risk (08/24/2022)   [facility] Utilities    Threatened with loss of utilities: No  Health  Literacy: Not on file    Outpatient Medications Prior to Visit  Medication Sig Dispense Refill   amitriptyline  (ELAVIL ) 25 MG tablet Take 1 tablet (25 mg total) by mouth at bedtime. 90 tablet 2   Calcium  Carbonate (CALCIUM  500 PO) Take 500 mg by mouth daily.     cholecalciferol (VITAMIN D3) 25 MCG (1000 UNIT) tablet Take 1,000 Units by mouth daily.     estradiol  (ESTRACE ) 0.5 MG tablet Take 1 tablet (0.5 mg total) by mouth daily. 90 tablet  4   Evolocumab  (REPATHA ) 140 MG/ML SOSY Inject 140 mg into the skin every 14 (fourteen) days. 6 mL 0   famotidine (PEPCID) 20 MG tablet Take 20 mg by mouth 2 (two) times daily.     levonorgestrel  (MIRENA ) 20 MCG/24HR IUD 1 each by Intrauterine route once.     Magnesium  250 MG CAPS Take 250 mg by mouth daily with lunch. 60 capsule 0   methocarbamol  (ROBAXIN ) 500 MG tablet Take 1 tablet (500 mg total) by mouth every 8 (eight) hours as needed for muscle spasms. 90 tablet 3   valACYclovir  (VALTREX ) 1000 MG tablet Take 2 tablets (2,000 mg total) by mouth at the start of symptoms and then 2 tablets (2,000 mg) 12 hours later. 40 tablet 1   valACYclovir  (VALTREX ) 1000 MG tablet Take 2 tablets (2,000 mg total) by mouth at the start of symptoms and then 2 tablets (2000mg ) 12 hours later. 40 tablet 1   fenofibrate  (TRICOR ) 145 MG tablet Take 1 tablet (145 mg total) by mouth daily. 90 tablet 3   No facility-administered medications prior to visit.    No Known Allergies  ROS Review of Systems  Constitutional:  Negative for chills and fever.  HENT:  Negative for congestion, sinus pressure, sinus pain and sore throat.   Eyes:  Negative for pain and discharge.  Respiratory:  Negative for cough and shortness of breath.   Cardiovascular:  Negative for chest pain and palpitations.  Gastrointestinal:  Negative for abdominal pain, diarrhea, nausea and vomiting.  Endocrine: Negative for polydipsia and polyuria.  Genitourinary:  Negative for dysuria and hematuria.   Musculoskeletal:  Negative for neck pain and neck stiffness.       Right foot/ankle pain  Skin:  Negative for rash.  Neurological:  Negative for dizziness and weakness.  Psychiatric/Behavioral:  Negative for agitation and behavioral problems.       Objective:    Physical Exam Vitals reviewed.  Constitutional:      General: She is not in acute distress.    Appearance: She is not diaphoretic.  HENT:     Head: Normocephalic and atraumatic.     Nose: Nose normal. No congestion.     Mouth/Throat:     Mouth: Mucous membranes are moist.     Pharynx: No posterior oropharyngeal erythema.  Eyes:     General: No scleral icterus.    Extraocular Movements: Extraocular movements intact.  Cardiovascular:     Rate and Rhythm: Normal rate and regular rhythm.     Heart sounds: Normal heart sounds. No murmur heard. Pulmonary:     Breath sounds: Normal breath sounds. No wheezing or rales.  Musculoskeletal:     Cervical back: Neck supple. No tenderness.     Right lower leg: No edema.     Left lower leg: No edema.  Skin:    General: Skin is warm.     Findings: No rash.  Neurological:     General: No focal deficit present.     Mental Status: She is alert and oriented to person, place, and time.     Sensory: No sensory deficit.     Motor: No weakness.  Psychiatric:        Mood and Affect: Mood normal.        Behavior: Behavior normal.     BP 120/69   Pulse 91   Ht 5' 5.5 (1.664 m)   Wt 184 lb 3.2 oz (83.6 kg)   SpO2 95%   BMI 30.19  kg/m  Wt Readings from Last 3 Encounters:  09/03/24 184 lb 3.2 oz (83.6 kg)  08/27/24 186 lb (84.4 kg)  07/22/24 183 lb (83 kg)    Lab Results  Component Value Date   TSH 1.980 08/26/2022   Lab Results  Component Value Date   WBC 6.8 03/12/2024   HGB 13.2 03/12/2024   HCT 40.7 03/12/2024   MCV 96.2 03/12/2024   PLT 219 03/12/2024   Lab Results  Component Value Date   NA 137 05/28/2024   K 4.0 05/28/2024   CO2 27 05/28/2024   GLUCOSE  73 05/28/2024   BUN 14 05/28/2024   CREATININE 0.67 05/28/2024   BILITOT 0.4 05/28/2024   ALKPHOS 72 08/26/2022   AST 34 05/28/2024   ALT 41 (H) 05/28/2024   PROT 6.9 05/28/2024   PROT 6.9 05/28/2024   ALBUMIN 4.5 08/26/2022   CALCIUM  9.7 08/01/2024   EGFR 107 05/28/2024   Lab Results  Component Value Date   CHOL 183 03/12/2024   Lab Results  Component Value Date   HDL 51 03/12/2024   Lab Results  Component Value Date   LDLCALC 91 03/12/2024   Lab Results  Component Value Date   TRIG 284 (H) 03/12/2024   Lab Results  Component Value Date   CHOLHDL 3.6 03/12/2024   Lab Results  Component Value Date   HGBA1C 5.0 03/12/2024      Assessment & Plan:   Problem List Items Addressed This Visit       Musculoskeletal and Integument   Peroneal tendinosis   S/p PRP injection, had responded well On amitriptyline  for pain Followed by Ortho care [facility]        Other   Insomnia   Overall well-controlled Takes amitriptyline  25 mg nightly      Hyperlipemia - Primary   Check lipid profile Has responded well to Repatha  LDL has been up to 225 - likely has familial hypercholesterolemia      Relevant Medications   fenofibrate  (TRICOR ) 145 MG tablet   Other Relevant Orders   Lipid panel   CMP14+EGFR   CBC with Differential/Platelet   Obesity (BMI 30-39.9)   BMI Readings from Last 3 Encounters:  09/03/24 30.19 kg/m  08/27/24 30.48 kg/m  07/22/24 29.99 kg/m   Advised to continue to follow low-carb diet and perform moderate exercise/walking at least 150 minutes/week Had discussion about medical weight loss options, including GLP-1 agonist therapies, but cost/insurance coverage is a concern Started phentermine  - advised to take half tablet once daily for 2 weeks and then increase to 1 tablet once daily      Relevant Medications   phentermine  (ADIPEX-P ) 37.5 MG tablet   Other Relevant Orders   TSH   CMP14+EGFR   CBC with Differential/Platelet   Other  Visit Diagnoses       Hyperglycemia       Relevant Orders   Hemoglobin A1c     Vitamin D  deficiency       Relevant Orders   VITAMIN D  25 Hydroxy (Vit-D Deficiency, Fractures)       Meds ordered this encounter  Medications   fenofibrate  (TRICOR ) 145 MG tablet    Sig: Take 1 tablet (145 mg total) by mouth daily.    Dispense:  90 tablet    Refill:  3   phentermine  (ADIPEX-P ) 37.5 MG tablet    Sig: Take 1 tablet (37.5 mg total) by mouth daily before breakfast.    Dispense:  30 tablet  Refill:  3    Follow-up: Return in about 4 months (around 01/01/2025).    Suzzane MARLA Blanch, MD "

## 2024-09-03 NOTE — Assessment & Plan Note (Signed)
 S/p PRP injection, had responded well On amitriptyline  for pain Followed by Ortho care [facility]

## 2024-09-04 ENCOUNTER — Encounter: Payer: Self-pay | Admitting: Internal Medicine

## 2024-09-04 ENCOUNTER — Other Ambulatory Visit (HOSPITAL_COMMUNITY): Payer: Self-pay

## 2024-09-04 ENCOUNTER — Encounter: Payer: Self-pay | Admitting: "Endocrinology

## 2024-09-05 MED ORDER — MAGNESIUM OXIDE -MG SUPPLEMENT 250 MG PO TABS
250.0000 mg | ORAL_TABLET | Freq: Every day | ORAL | 0 refills | Status: AC
  Filled 2024-09-05: qty 90, fill #0
  Filled 2024-09-17: qty 100, 100d supply, fill #0

## 2024-09-09 ENCOUNTER — Telehealth: Payer: Self-pay | Admitting: Orthopedic Surgery

## 2024-09-09 NOTE — Telephone Encounter (Signed)
 Refaxed [employer] form for intermittent leave, originally faxed 09/03/24 [phone]

## 2024-09-10 ENCOUNTER — Other Ambulatory Visit (HOSPITAL_COMMUNITY): Payer: Self-pay

## 2024-09-13 ENCOUNTER — Other Ambulatory Visit (HOSPITAL_COMMUNITY): Payer: Self-pay

## 2024-09-17 ENCOUNTER — Other Ambulatory Visit (HOSPITAL_COMMUNITY): Payer: Self-pay

## 2024-09-17 ENCOUNTER — Other Ambulatory Visit: Payer: Self-pay

## 2024-10-08 ENCOUNTER — Ambulatory Visit: Admitting: Rheumatology

## 2024-10-30 ENCOUNTER — Ambulatory Visit: Admitting: Obstetrics & Gynecology

## 2025-01-07 ENCOUNTER — Ambulatory Visit: Payer: Self-pay | Admitting: Internal Medicine
